# Patient Record
Sex: Female | Born: 1975 | Hispanic: Yes | Marital: Single | Smoking: Never smoker
Health system: Southern US, Community
[De-identification: ages and names within clinical notes are randomized; demographics above are authoritative.]

## PROBLEM LIST (undated history)

## (undated) ENCOUNTER — Emergency Department (HOSPITAL_COMMUNITY): Admission: EM | Payer: Self-pay

## (undated) DIAGNOSIS — Z8742 Personal history of other diseases of the female genital tract: Secondary | ICD-10-CM

## (undated) DIAGNOSIS — Z8639 Personal history of other endocrine, nutritional and metabolic disease: Secondary | ICD-10-CM

## (undated) DIAGNOSIS — K219 Gastro-esophageal reflux disease without esophagitis: Secondary | ICD-10-CM

## (undated) DIAGNOSIS — G44209 Tension-type headache, unspecified, not intractable: Secondary | ICD-10-CM

## (undated) HISTORY — DX: Gastro-esophageal reflux disease without esophagitis: K21.9

## (undated) HISTORY — DX: Tension-type headache, unspecified, not intractable: G44.209

## (undated) HISTORY — DX: Personal history of other diseases of the female genital tract: Z87.42

## (undated) HISTORY — DX: Personal history of other endocrine, nutritional and metabolic disease: Z86.39

---

## 2014-07-14 ENCOUNTER — Encounter: Payer: Self-pay | Admitting: Family Medicine

## 2014-07-14 ENCOUNTER — Ambulatory Visit: Payer: Self-pay | Attending: Family Medicine | Admitting: Family Medicine

## 2014-07-14 VITALS — BP 107/70 | HR 87 | Temp 98.6°F | Resp 18 | Ht 60.5 in | Wt 145.0 lb

## 2014-07-14 DIAGNOSIS — B9689 Other specified bacterial agents as the cause of diseases classified elsewhere: Secondary | ICD-10-CM

## 2014-07-14 DIAGNOSIS — K219 Gastro-esophageal reflux disease without esophagitis: Secondary | ICD-10-CM | POA: Insufficient documentation

## 2014-07-14 DIAGNOSIS — Z113 Encounter for screening for infections with a predominantly sexual mode of transmission: Secondary | ICD-10-CM

## 2014-07-14 DIAGNOSIS — Z23 Encounter for immunization: Secondary | ICD-10-CM

## 2014-07-14 DIAGNOSIS — Z114 Encounter for screening for human immunodeficiency virus [HIV]: Secondary | ICD-10-CM | POA: Insufficient documentation

## 2014-07-14 DIAGNOSIS — N946 Dysmenorrhea, unspecified: Secondary | ICD-10-CM | POA: Insufficient documentation

## 2014-07-14 DIAGNOSIS — Z124 Encounter for screening for malignant neoplasm of cervix: Secondary | ICD-10-CM | POA: Insufficient documentation

## 2014-07-14 DIAGNOSIS — G44209 Tension-type headache, unspecified, not intractable: Secondary | ICD-10-CM | POA: Insufficient documentation

## 2014-07-14 DIAGNOSIS — N760 Acute vaginitis: Secondary | ICD-10-CM | POA: Insufficient documentation

## 2014-07-14 DIAGNOSIS — A499 Bacterial infection, unspecified: Secondary | ICD-10-CM

## 2014-07-14 LAB — COMPLETE METABOLIC PANEL WITH GFR
ALT: 11 U/L (ref 0–35)
AST: 15 U/L (ref 0–37)
Albumin: 3.9 g/dL (ref 3.5–5.2)
Alkaline Phosphatase: 57 U/L (ref 39–117)
BUN: 8 mg/dL (ref 6–23)
CALCIUM: 8.9 mg/dL (ref 8.4–10.5)
CO2: 22 meq/L (ref 19–32)
CREATININE: 0.59 mg/dL (ref 0.50–1.10)
Chloride: 105 mEq/L (ref 96–112)
GFR, Est African American: >89 mL/min
GFR, Est Non African American: >89 mL/min
Glucose, Bld: 104 mg/dL — ABNORMAL HIGH (ref 70–99)
Potassium: 4.1 mEq/L (ref 3.5–5.3)
Sodium: 138 mEq/L (ref 135–145)
Total Bilirubin: 0.2 mg/dL (ref 0.2–1.2)
Total Protein: 7.1 g/dL (ref 6.0–8.3)

## 2014-07-14 LAB — CBC
HCT: 30.9 % — ABNORMAL LOW (ref 36.0–46.0)
Hemoglobin: 10.4 g/dL — ABNORMAL LOW (ref 12.0–15.0)
MCH: 26.3 pg (ref 26.0–34.0)
MCHC: 33.7 g/dL (ref 30.0–36.0)
MCV: 78 fL (ref 78.0–100.0)
MPV: 9.4 fL (ref 9.4–12.4)
PLATELETS: 391 10*3/uL (ref 150–400)
RBC: 3.96 MIL/uL (ref 3.87–5.11)
RDW: 15.9 % — AB (ref 11.5–15.5)
WBC: 8.8 10*3/uL (ref 4.0–10.5)

## 2014-07-14 MED ORDER — OMEPRAZOLE 20 MG PO CPDR: 20.0000 mg | DELAYED_RELEASE_CAPSULE | Freq: Every day | ORAL | Status: DC

## 2014-07-14 MED ORDER — NAPROXEN 500 MG PO TABS: 500.0000 mg | ORAL_TABLET | Freq: Two times a day (BID) | ORAL | Status: DC

## 2014-07-14 NOTE — Assessment & Plan Note (Signed)
A: dysmenorrhea x 6 months P: Naproxen 3 days before and first 2 days of period Pelvic ultrasound

## 2014-07-14 NOTE — Assessment & Plan Note (Signed)
A: tension HA P: Wean down caffeine NSAID prn

## 2014-07-14 NOTE — Assessment & Plan Note (Signed)
A: Gerd  P: PPI Wean down caffeine

## 2014-07-14 NOTE — Patient Instructions (Addendum)
Ms. Meriel PicaHernandez-Torres,  Thank you for coming in today. It was a pleasure meeting you. I look forward to being your primary doctor.   You will be called with lab results.  1. por dolor en su lado derecho.  Tome naproxen 500 mg dos tiempos cada dia  por tres dias antes su period, period dia uno y period [address] dos.  Pelvic ultrasound.   regresar in American Financialdos meses.   Dr. Armen PickupFunches

## 2014-07-14 NOTE — Assessment & Plan Note (Signed)
Screening HIV  

## 2014-07-14 NOTE — Assessment & Plan Note (Signed)
Pap done today  

## 2014-07-14 NOTE — Progress Notes (Signed)
Establish Care  Annual physical and pap Complaining of body ache and HA

## 2014-07-14 NOTE — Progress Notes (Signed)
   Subjective:    Patient ID: Oneal DeputyMaria Gloria Hernandez-Torres, female    DOB: 06/05/1976, 38 y.o.   MRN: [id number] CC: headache and right side  HPI 38 yo F  1. R side pain: x one week prior to period goes away, then comes back and stays there. Period started on 07/03/14. Period ended 07/10/14. Gets pain like this with every period for last 6 months. Pain comes and goes during the period, lasting 1-2 days. No vaginal discharge. Subjective fever at night with nausea. No emesis. No dizziness.   2. Headaches: gets headaches. Once per week. Frontal. Severe pain. Associated with photosensitivity. Better with rest. Better with ibuprofen. Drinks 3 cups of coffee daily. Triggers: no know.  No emesis or weakness.   Soc hx: chronic non smoker Fam hx: DM2 Surg Hx: C-section  Review of Systems     Objective:   Physical Exam BP 107/70 mmHg  Pulse 87  Temp(Src) 98.6 F (37 C) (Oral)  Resp 18  Ht 5' 0.5" (1.537 m)  Wt 145 lb (65.772 kg)  BMI 27.84 kg/m2  SpO2 100%  LMP 07/10/2014  General appearance: alert, cooperative and no distress Head: Normocephalic, without obvious abnormality, atraumatic Eyes: conjunctivae/corneas clear. PERRL, EOM's intact.  Ears: normal TM's and external ear canals both ears Nose: Nares normal. Septum midline. Mucosa normal. No drainage or sinus tenderness. Throat: lips, mucosa, and tongue normal; teeth and gums normal Abdomen: soft, non-tender; bowel sounds normal; no masses,  no organomegaly Pelvic: cervix normal in appearance, external genitalia normal, no adnexal masses or tenderness, no cervical motion tenderness, rectovaginal septum normal, uterus normal size, shape, and consistency and vagina normal without discharge   Assessment & Plan:

## 2014-07-15 DIAGNOSIS — B9689 Other specified bacterial agents as the cause of diseases classified elsewhere: Secondary | ICD-10-CM | POA: Insufficient documentation

## 2014-07-15 DIAGNOSIS — N760 Acute vaginitis: Secondary | ICD-10-CM

## 2014-07-15 LAB — HIV ANTIBODY (ROUTINE TESTING W REFLEX): HIV 1&2 Ab, 4th Generation: NONREACTIVE

## 2014-07-15 LAB — CERVICOVAGINAL ANCILLARY ONLY
Chlamydia: NEGATIVE
NEISSERIA GONORRHEA: NEGATIVE
WET PREP (BD AFFIRM): NEGATIVE
Wet Prep (BD Affirm): NEGATIVE
Wet Prep (BD Affirm): POSITIVE — AB

## 2014-07-15 LAB — CYTOLOGY - PAP

## 2014-07-15 MED ORDER — METRONIDAZOLE 500 MG PO TABS: 500.0000 mg | ORAL_TABLET | Freq: Two times a day (BID) | ORAL | Status: DC

## 2014-07-15 MED ORDER — FLUCONAZOLE 150 MG PO TABS: 150.0000 mg | ORAL_TABLET | Freq: Once | ORAL | Status: DC

## 2014-07-15 NOTE — Assessment & Plan Note (Signed)
A: BV on wet prep P: Flagyl Diflucan

## 2014-07-15 NOTE — Addendum Note (Signed)
Addended by: Dessa PhiFUNCHES, Shawndrea Rutkowski on: 07/15/2014 06:02 PM   Modules accepted: Orders

## 2014-07-20 ENCOUNTER — Ambulatory Visit (HOSPITAL_COMMUNITY)
Admission: RE | Admit: 2014-07-20 | Discharge: 2014-07-20 | Disposition: A | Discharge status: Home / Self Care | Payer: Self-pay | Source: Ambulatory Visit | Attending: Family Medicine | Admitting: Family Medicine

## 2014-07-20 ENCOUNTER — Other Ambulatory Visit: Payer: Self-pay | Admitting: Family Medicine

## 2014-07-20 DIAGNOSIS — N946 Dysmenorrhea, unspecified: Secondary | ICD-10-CM

## 2014-07-20 DIAGNOSIS — R102 Pelvic and perineal pain: Secondary | ICD-10-CM | POA: Insufficient documentation

## 2014-07-20 DIAGNOSIS — R938 Abnormal findings on diagnostic imaging of other specified body structures: Secondary | ICD-10-CM | POA: Insufficient documentation

## 2014-08-11 ENCOUNTER — Telehealth: Payer: Self-pay | Admitting: Family Medicine

## 2014-08-11 DIAGNOSIS — N946 Dysmenorrhea, unspecified: Secondary | ICD-10-CM

## 2014-08-11 NOTE — Addendum Note (Signed)
Addended by: Dessa PhiFUNCHES, Reyes Aldaco on: 08/11/2014 04:25 PM   Modules accepted: Orders

## 2014-08-11 NOTE — Telephone Encounter (Signed)
Pt.came into facility to request blood work results, please f/u with pt.

## 2014-08-11 NOTE — Telephone Encounter (Signed)
Ordered ultrasound

## 2014-08-11 NOTE — Assessment & Plan Note (Signed)
Plan for f/u ultrasound in 6-8 weeks, 1 week after periods. Patient will need to keep track of periods and let us know when she starts so we can schedule ultrasound one week after first day of menstrual period. Will obtain pelvic ultrasound on cycle day #8

## 2014-08-11 NOTE — Telephone Encounter (Signed)
Pt aware of lab results, US results, advised to call 1 week after menses to reschedule [place]

## 2014-09-22 ENCOUNTER — Encounter: Payer: Self-pay | Admitting: Family Medicine

## 2014-09-22 ENCOUNTER — Ambulatory Visit: Payer: Self-pay | Attending: Family Medicine | Admitting: Family Medicine

## 2014-09-22 VITALS — BP 126/84 | HR 80 | Temp 98.1°F | Ht 65.0 in | Wt 151.0 lb

## 2014-09-22 DIAGNOSIS — Z113 Encounter for screening for infections with a predominantly sexual mode of transmission: Secondary | ICD-10-CM

## 2014-09-22 DIAGNOSIS — N946 Dysmenorrhea, unspecified: Secondary | ICD-10-CM

## 2014-09-22 DIAGNOSIS — B9689 Other specified bacterial agents as the cause of diseases classified elsewhere: Secondary | ICD-10-CM

## 2014-09-22 DIAGNOSIS — N760 Acute vaginitis: Secondary | ICD-10-CM

## 2014-09-22 DIAGNOSIS — R938 Abnormal findings on diagnostic imaging of other specified body structures: Secondary | ICD-10-CM

## 2014-09-22 DIAGNOSIS — Z124 Encounter for screening for malignant neoplasm of cervix: Secondary | ICD-10-CM

## 2014-09-22 DIAGNOSIS — A499 Bacterial infection, unspecified: Secondary | ICD-10-CM

## 2014-09-22 DIAGNOSIS — R9389 Abnormal findings on diagnostic imaging of other specified body structures: Secondary | ICD-10-CM

## 2014-09-22 MED ORDER — NAPROXEN 500 MG PO TABS: 500.0000 mg | ORAL_TABLET | Freq: Two times a day (BID) | ORAL | Status: DC

## 2014-09-22 NOTE — Assessment & Plan Note (Signed)
Screening HIV negative  

## 2014-09-22 NOTE — Assessment & Plan Note (Signed)
Normal pap

## 2014-09-22 NOTE — Assessment & Plan Note (Signed)
A: endometrial thickness P: F/u pelvic and TVUS ordered

## 2014-09-22 NOTE — Patient Instructions (Addendum)
Ms. Meriel PicaHernandez-Torres,   Thank you for coming back in to see me today.   We need a follow up pelvic ultrasound one week after you start your next period.  We are looking at the thickness of your uterine lining (endometrium).  Your endometrium was a bit thick on the last ultrasound which may be normal thickening during the cycle.    Por dolor en su lado derecho.  Tome naproxen 500 mg dos tiempos cada dia  por dos dias antes su period, period dia uno y period [address] dos.  You will be called with ultrasound results.  F/u in 6 months   Dr. Armen PickupFunches

## 2014-09-22 NOTE — Assessment & Plan Note (Signed)
A: improved.  P: continue naproxen

## 2014-09-22 NOTE — Progress Notes (Signed)
   Subjective:    Patient ID: Cheryl Espinoza, female    DOB: 04/12/1976, [age] y.o.   MRN: [id number] CC: f/u dysmenorrhea,  HPI 39 yo F presents for f/u:   Spanish interpreter present  1. Dysmenorrhea: improved. Lighter and less painful periods with naproxen. Periods are still irregular every 5-8 weeks, lasting around 6 days. No intermenstrual bleeding. She is aware of previous labs and ultrasiund results.   2. Thickened endometrium: noted on last pelvic U/S. ROS as above. F/u ultrasound one week after LMP recommended.   Soc Hx: non smoker  Review of Systems As per HPI     Objective:   Physical Exam BP 126/84 mmHg  Pulse 80  Temp(Src) 98.1 F (36.7 C) (Oral)  Ht 5\' 5"  (1.651 m)  Wt 151 lb (68.493 kg)  BMI 25.13 kg/m2  SpO2 100%  LMP 09/01/2014 General appearance: alert, cooperative and no distress  Lungs: normal WOB        Assessment & Plan:

## 2014-09-22 NOTE — Progress Notes (Signed)
Pt is here for follow-up. Pt states she is still having headaches once a week. She states these have improved.  Pt also states that her period flow has become lighter since last visit. She has her period every month.  Pt denies any vaginal discharge at this time. First day of her last period was 09-01-14.

## 2014-09-22 NOTE — Assessment & Plan Note (Signed)
Treated

## 2014-10-06 ENCOUNTER — Other Ambulatory Visit: Payer: Self-pay | Admitting: Family Medicine

## 2014-10-06 ENCOUNTER — Telehealth: Payer: Self-pay | Admitting: *Deleted

## 2014-10-06 ENCOUNTER — Ambulatory Visit (HOSPITAL_COMMUNITY)
Admission: RE | Admit: 2014-10-06 | Discharge: 2014-10-06 | Disposition: A | Discharge status: Home / Self Care | Payer: Self-pay | Source: Ambulatory Visit | Attending: Family Medicine | Admitting: Family Medicine

## 2014-10-06 ENCOUNTER — Ambulatory Visit (HOSPITAL_COMMUNITY): Payer: Self-pay

## 2014-10-06 DIAGNOSIS — R9389 Abnormal findings on diagnostic imaging of other specified body structures: Secondary | ICD-10-CM

## 2014-10-06 DIAGNOSIS — N946 Dysmenorrhea, unspecified: Secondary | ICD-10-CM | POA: Insufficient documentation

## 2014-10-06 DIAGNOSIS — R938 Abnormal findings on diagnostic imaging of other specified body structures: Secondary | ICD-10-CM | POA: Insufficient documentation

## 2014-10-06 NOTE — Telephone Encounter (Signed)
LVM to return call.

## 2014-10-06 NOTE — Telephone Encounter (Signed)
-----   Message from Lora PaulaJosalyn C Funches, MD sent at 10/06/2014  9:41 AM EST ----- Persistent thickened endometrium, endometrial biopsy recommended. This procedure is set up like a pap, but instead of samples from the cervix tissue is sampled from the inside of the uterus.  Cramping and spotting is common after the procedure. This is an office procedure Gynecology referral placed.

## 2014-10-06 NOTE — Telephone Encounter (Signed)
-----   Message from Josalyn C Funches, MD sent at 10/06/2014  9:41 AM EST ----- Persistent thickened endometrium, endometrial biopsy recommended. This procedure is set up like a pap, but instead of samples from the cervix tissue is sampled from the inside of the uterus.  Cramping and spotting is common after the procedure. This is an office procedure Gynecology referral placed. 

## 2014-10-07 ENCOUNTER — Telehealth: Payer: Self-pay | Admitting: Family Medicine

## 2014-10-07 NOTE — Telephone Encounter (Signed)
Patient is returning phone call from nurse about results, please f/u with pt. °

## 2014-10-07 NOTE — Telephone Encounter (Signed)
Pt aware of results/ biopsy appointment schedule

## 2014-11-03 ENCOUNTER — Ambulatory Visit: Payer: Self-pay | Attending: Family Medicine

## 2014-11-07 ENCOUNTER — Other Ambulatory Visit (HOSPITAL_COMMUNITY)
Admission: RE | Admit: 2014-11-07 | Discharge: 2014-11-07 | Disposition: A | Discharge status: Home / Self Care | Payer: Self-pay | Source: Ambulatory Visit | Attending: Obstetrics & Gynecology | Admitting: Obstetrics & Gynecology

## 2014-11-07 ENCOUNTER — Encounter: Payer: Self-pay | Admitting: Obstetrics & Gynecology

## 2014-11-07 ENCOUNTER — Ambulatory Visit (INDEPENDENT_AMBULATORY_CARE_PROVIDER_SITE_OTHER): Payer: Self-pay | Admitting: Obstetrics & Gynecology

## 2014-11-07 VITALS — BP 108/68 | HR 75 | Wt 146.7 lb

## 2014-11-07 DIAGNOSIS — N946 Dysmenorrhea, unspecified: Secondary | ICD-10-CM

## 2014-11-07 DIAGNOSIS — N920 Excessive and frequent menstruation with regular cycle: Secondary | ICD-10-CM

## 2014-11-07 LAB — POCT PREGNANCY, URINE: Preg Test, Ur: NEGATIVE

## 2014-11-07 MED ORDER — NORGESTIMATE-ETH ESTRADIOL 0.25-35 MG-MCG PO TABS: 1.0000 | ORAL_TABLET | Freq: Every day | ORAL | Status: DC

## 2014-11-07 NOTE — Progress Notes (Signed)
Subjective:     Patient ID: Cheryl Espinoza, female   DOB: 11/15/1975, 39 y.o.   MRN: [id number]  HPI Pt reports heavy cycles lasting 7 days.  She reports that the heavy cycles since May of last year.  She reports cycles monthly.  She was prev on medicine to assist with heavy cycles but, she does not take it now.  No contraception. Not sexually active.     History reviewed. No pertinent past medical history.  Past Surgical History  Procedure Laterality Date  . Cesarean section  2000, 2002    Current Outpatient Prescriptions on File Prior to Visit  Medication Sig Dispense Refill  . naproxen (NAPROSYN) 500 MG tablet Take 1 tablet (500 mg total) by mouth 2 (two) times daily with a meal. (Patient not taking: Reported on 11/07/2014) 30 tablet 1  . omeprazole (PRILOSEC) 20 MG capsule Take 1 capsule (20 mg total) by mouth daily. (Patient not taking: Reported on 11/07/2014) 30 capsule 3   No current facility-administered medications on file prior to visit.  No Known Allergies    Review of Systems     Objective:   Physical Exam BP 108/68 mmHg  Pulse 75  Wt 146 lb 11.2 oz (66.543 kg)  LMP 11/03/2014 (Exact Date) Pt in NAD  The indications for endometrial biopsy were reviewed.   Risks of the biopsy including cramping, bleeding, infection, uterine perforation, inadequate specimen and need for additional procedures  were discussed. The patient states she understands and agrees to undergo procedure today. Consent was signed. Time out was performed. Urine HCG was negative. A sterile speculum was placed in the patient's vagina and the cervix was prepped with Betadine. A single-toothed tenaculum was placed on the anterior lip of the cervix to stabilize it. The 3 mm pipelle was introduced into the endometrial cavity without difficulty to a depth of 10cm, and a moderate amount of tissue was obtained and sent to pathology. The instruments were removed from the patient's vagina. Minimal bleeding  from the cervix was noted. The patient tolerated the procedure well. Routine post-procedure instructions were given to the patient.        10/06/2014 CLINICAL DATA: Dysmenorrhea.  EXAM: TRANSABDOMINAL AND TRANSVAGINAL ULTRASOUND OF PELVIS  TECHNIQUE: Both transabdominal and transvaginal ultrasound examinations of the pelvis were performed. Transabdominal technique was performed for global imaging of the pelvis including uterus, ovaries, adnexal regions, and pelvic cul-de-sac. It was necessary to proceed with endovaginal exam following the transabdominal exam to visualize the uterus and ovaries.  COMPARISON: 07/20/2014.  FINDINGS: Uterus  Measurements: 9.2 x 4.0 x 5.5 cm. No fibroids or other mass visualized.  Endometrium  Thickness: 16-421mm. Endometrial borders are very difficult to identify and thus measure. Endometrial thickening is not improved.  Right ovary  Measurements: 2.8 x 2.2 x 2.2 cm. Normal appearance/no adnexal mass.  Left ovary  Measurements: 2.9 x 2.5 x 1.4 cm. Normal appearance/no adnexal mass.  Other findings  No free fluid.  IMPRESSION: Persistent endometrial thickening. Endometrial sampling should be considered.  Assessment:     menorhagia- thickedned endometrium on sono x 2. Pt worried.      Plan:    Sprintec 1 po q day F/u in 3 months F/u endobx  The patient will follow up to review the results and for further management.

## 2014-11-07 NOTE — Patient Instructions (Signed)
Levonorgestrel intrauterine device (IUD) Qu es este medicamento? El LEVONORGESTREL (DIU) es un dispositivo anticonceptivo (control de natalidad). El dispositivo se coloca dentro del tero por un profesional de la salud. Se utiliza para evitar el embarazo y tambin se puede utilizar para tratar el sangrado abundante que ocurre durante su perodo. Dependiendo del dispositivo, se puede utilizar por 3 a 5 aos. Este medicamento puede ser utilizado para otros usos; si tiene alguna pregunta consulte con su proveedor de atencin mdica o con su farmacutico. MARCAS COMERCIALES DISPONIBLES: LILETTA, Mirena, Skyla Qu le debo informar a mi profesional de la salud antes de tomar este medicamento? Necesita saber si usted presenta alguno de los siguientes problemas o situaciones: -exmen de Papanicolaou anormal -cncer de mama, cuello del tero o tero -diabetes -endometritis -si tiene una infeccin plvica o genital actual o en el pasado -tiene ms de una pareja sexual o si su pareja tiene ms de una pareja -enfermedad cardiaca -antecedente de embarazo tubrico o ectpico -problemas del sistema inmunolgico -DIU colocado -enfermedad heptica o tumor del hgado -problemas con la coagulacin o si toma diluyentes sanguneos -usa medicamentos intravenoso -forma inusual del tero -sangrado vaginal que no tiene explicacin -una reaccin alrgica o inusual al levonorgestrel, a otras hormonas, a la silicona o polietilenos, a otros medicamentos, alimentos, colorantes o conservantes -si est embarazada o buscando quedar embarazada -si est amamantando a un beb Cmo debo utilizar este medicamento? Un profesional de la salud coloca este dispositivo en el tero. Hable con su pediatra para informarse acerca del uso de este medicamento en nios. Puede requerir atencin especial. Sobredosis: Pngase en contacto inmediatamente con un centro toxicolgico o una sala de urgencia si usted cree que haya tomado  demasiado medicamento. ATENCIN: Este medicamento es solo para usted. No comparta este medicamento con nadie. Qu sucede si me olvido de una dosis? No se aplica en este caso. Qu puede interactuar con este medicamento? No tome esta medicina con ninguno de los siguientes medicamentos: -amprenavir -bosentano -fosamprenavir Esta medicina tambin puede interactuar con los siguientes medicamentos: -aprepitant -barbitricos para producir el sueo o para el tratamiento de convulsiones -bexaroteno -griseofulvina -medicamentos para tratar los convulsiones, tales como carbamazepina, etotona, felbamato, oxcarbazepina, fenitona, topiramato -modafinilo -pioglitazona -rifabutina -rifampicina -rifapentina -algunos medicamentos para tratar el virus VIH, tales como atazanavir, indinavir, lopinavir, nelfinavir, tipranavir, ritonavir -hierba de [place] -warfarina Puede ser que esta lista no menciona todas las posibles interacciones. Informe a su profesional de la salud de todos los productos a base de hierbas, medicamentos de venta libre o suplementos nutritivos que est tomando. Si usted fuma, consume bebidas alcohlicas o si utiliza drogas ilegales, indqueselo tambin a su profesional de la salud. Algunas sustancias pueden interactuar con su medicamento. A qu debo estar atento al usar este medicamento? Visite a su mdico o a su profesional de la salud para chequear su evolucin peridicamente. Visite a su mdico si usted o su pareja tiene relaciones sexuales con otras personas, se vuelve VIH positivo o contrae una enfermedad de transmisin sexual. Este medicamento no la protege de la infeccin por VIH (SIDA) ni de ninguna otra enfermedad de transmisin sexual. Puede controlar la ubicacin del DIU usted misma palpando con sus dedos limpios los hilos en la parte anterior de la vagina. No tire de los hilos. Es un buen hbito controlar la ubicacin del dispositivo despus de cada perodo menstrual. Si  no slo siente los hilos sino que adems siente otra parte ms del DIU o si no puede sentir los hilos, consulte a su   mdico inmediatamente. El DIU puede salirse por s solo. Puede quedar embarazada si el dispositivo se sale de Nature conservation officersu lugar. Utilice un mtodo anticonceptivo adicional, como preservativos, y consulte a su proveedor de atencin mdica s observa que el DIU se sali de Nature conservation officersu lugar. La utilizacin de tampones no cambia la posicin del DIU y no hay inconvenientes en usarlos durante su perodo. Qu efectos secundarios puedo tener al Boston Scientificutilizar este medicamento? Efectos secundarios que debe informar a su mdico o a Producer, television/film/videosu profesional de la salud tan pronto como sea posible: -Therapist, artreacciones alrgicas como erupcin cutnea, picazn o urticarias, hinchazn de la cara, labios o lengua -fiebre, sntomas gripales -llagas genitales -alta presin sangunea -ausencia de un perodo menstrual durante 6 semanas mientras lo utiliza -Engineer, miningdolor, Public librarianhinchazn o calor en las piernas -dolor o sensibilidad del plvico -dolor de cabeza repentino o severo -signos de Psychiatristembarazo -calambres estomacales -falta de aliento repentina -problemas de coordinacin, del habla, al caminar -sangrado, flujo vaginal inusual -color amarillento de los ojos o la piel Efectos secundarios que, por lo general, no requieren atencin mdica (debe informarlos a su mdico o a su profesional de la salud si persisten o si son molestos): -acn -dolor de pecho -cambios en el deseo sexual o capacidad -cambios de peso -calambres, Research scientist (life sciences)mareos o sensacin de The Pepsidesmayo mientras se introduce el dispositivo -dolor de cabeza -sangrado menstruales irregulares en los primeros 3 a 6 meses de usar -nuseas Puede ser que esta lista no menciona todos los posibles efectos secundarios. Comunquese a su mdico por asesoramiento mdico Hewlett-Packardsobre los efectos secundarios. Usted puede informar los efectos secundarios a la FDA por telfono al 1-800-FDA-1088. Dnde debo guardar mi  medicina? No se aplica en este caso. ATENCIN: Este folleto es un resumen. Puede ser que no cubra toda la posible informacin. Si usted tiene preguntas acerca de esta medicina, consulte con su mdico, su farmacutico o su profesional de Radiographer, therapeuticla salud.  2015, Elsevier/Gold Standard. (2011-10-01 16:57:41) Uso de los anticonceptivos orales (Oral Contraception Use) Los anticonceptivos orales (ACO) son medicamentos que se utilizan para Location managerevitar el embarazo. Su funcin es ALLTEL Corporationevitar que los ovarios liberen vulos. Las hormonas de los ACO tambin hacen que el moco cervical se haga ms espeso, lo que evita que el esperma ingrese al tero. Tambin hacen que la membrana que recubre internamente al tero se vuelva ms fina, lo que no permite que el huevo fertilizado se adhiera a la pared del tero. Los ACO son muy efectivos cuando se toman exactamente como se prescriben. Sin embargo, no previenen contra las enfermedades de transmisin sexual (ETS). La prctica del sexo seguro, como el uso de preservativos, junto con los ACO, [place]udan a prevenir ese tipo de enfermedades. Antes de tomar ACO, debe hacerse un examen fsico y un Papanicolau. El mdico podr indicarle anlisis de [place]angre, si es necesario. El mdico se asegurar de que usted sea [place] Pointuna buena candidata para usar anticonceptivos orales. Converse con su mdico acerca de los posibles efectos secundarios de los ACO que podran recetarle. Cuando se inicia el uso de ACO, se pueden tomar durante 2 a 3 meses para que el cuerpo se adapte a los cambios en los niveles hormonales en el cuerpo.  CMO TOMAR LOS ANTICONCEPTIVOS ORALES El mdico le indicar como comenzar a Building services engineertomar el primer ciclo de ACO. De lo contrario usted puede:   Engineering geologistComenzar el da de inicio del ciclo menstrual. No necesitar proteccin anticonceptiva adicional al Investment banker, operationalcomenzar en este momento.   Comenzar Financial risk analystel primer domingo luego de su perodo menstrual, o Medical laboratory scientific officerel da  en que adquiere el medicamento. En estos casos deber EchoStartener  proteccin anticonceptiva The TJX Companiesadicional durante los primeros 7 das del Luzerneciclo.   Comenzar a tomarlos en cualquier momento del ciclo. Si toma el anticonceptivo dentro de los [address] das de iniciado el perodo, Theme park managerestar protegida de quedar embarazada inmediatamente. En este caso, no necesitar una forma adicional de anticonceptivos. Si comienza en cualquier otro momento del ciclo menstrual, necesitar usar otra forma de anticonceptivo durante 7 [address]  [address]. Si sus ACO son del tipo de los Citigroupllamados minipldoras, podrn impedir el embarazo despus de tomarlas por 2 das (48 horas). Luego de comenzar a tomar los ACO:   Si olvid de tomar 1 pldora, tmela tan pronto como lo recuerde. Tome la siguiente pldora a la hora habitual.   Si dej de tomar 2 o ms pldoras, comunquese con su mdico ya que diferentes pldoras tienen diferentes instrucciones para las dosis que no se han tomado. Si olvida tomar 2 o ms pldoras, utilice un mtodo anticonceptivo adicional hasta que comience su prximo perodo menstrual.   Si utiliza el envase de 28 pldoras que contienen pldoras inactivas y Venezuelaolvida tomar 1 de las ltimas 7 (pldoras sin hormonas), sto no tiene Quarry managerimportancia. Simplemente deseche el resto de las pldoras que no contienen hormonas y comience un nuevo envase.  No importa cuando comience a tomar los anticonceptivos, siempre empiece un nuevo envase el mismo da de la [place] Creeksemana. Tenga un envase extra de ACO y use un mtodo anticonceptivo adicional para Restaurant manager, fast foodel caso en que se olvide de tomar algunas pldoras o pierda la caja.  INSTRUCCIONES PARA EL CUIDADO EN EL HOGAR   No fume.   Use siempre un condn para protegerse contra las enfermedades de transmisin sexual. Los ACO no protegen contra las enfermedades de transmisin sexual.   Use un almanaque para Thrivent Financialmarcar los das de su perodo menstrual.   Lea la informacin y consejos que vienen con las ACO. Hable con el profesional si tiene dudas.  SOLICITE ATENCIN MDICA SI:    Presenta nuseas o vmitos.   Tiene flujo o sangrado vaginal anormal.   Aparece una erupcin cutnea.   No tiene el perodo menstrual.   Pierde el cabello.   Necesita tratamiento por cambios en su estado de nimo o por depresin.   Se siente mareada al Liberty Mutualtomar los ACO.   Comienza a aparecer acn con el uso de los ACO.   Ardelle AntonQueda embarazada.  SOLICITE ATENCIN MDICA DE INMEDIATO SI:   Siente dolor en el pecho.   Le falta el aire.   Le duele mucho la cabeza y no puede Human resources officercontrolar el dolor.   Siente adormecimiento o tiene dificultad para hablar.   Tiene problemas de visin.   Presenta dolor, inflamacin o hinchazn en las piernas.  Document Released: 08/01/2011 Document Revised: 04/14/2013 [facility]Exit[facility] Patient Information 2015 [employer] DayExitCare, MarylandLLC. This information is not intended to replace advice given to you by your health care provider. Make sure you discuss any questions you have with your health care provider.

## 2014-11-14 ENCOUNTER — Telehealth: Payer: Self-pay | Admitting: *Deleted

## 2014-11-14 NOTE — Telephone Encounter (Signed)
-----   Message from Willodean Rosenthalarolyn Harraway-Smith, MD sent at 11/14/2014  9:58 AM EDT ----- Please call pt.  Her endometrial bx was negative.  Please continue the Sprintec as prescribed.  Thx, clh-S

## 2014-11-14 NOTE — Telephone Encounter (Signed)
Attempted to contact patient with Cheryl Espinoza for results, no answer, left a message for the patient to contact the clinic for results.

## 2014-11-15 NOTE — Telephone Encounter (Signed)
Called patient with Cheryl Espinoza for interpreter and informed her of results and recommendations. Patient verbalized understanding and had no questions

## 2015-01-27 ENCOUNTER — Ambulatory Visit: Payer: Self-pay | Admitting: Family Medicine

## 2015-02-09 ENCOUNTER — Ambulatory Visit: Payer: Self-pay | Admitting: Family Medicine

## 2015-03-15 ENCOUNTER — Ambulatory Visit: Payer: Self-pay | Attending: Family Medicine | Admitting: Family Medicine

## 2015-03-15 ENCOUNTER — Encounter: Payer: Self-pay | Admitting: Family Medicine

## 2015-03-15 VITALS — BP 117/79 | HR 87 | Temp 98.5°F | Resp 16 | Ht 60.5 in | Wt 144.0 lb

## 2015-03-15 DIAGNOSIS — B353 Tinea pedis: Secondary | ICD-10-CM

## 2015-03-15 DIAGNOSIS — K219 Gastro-esophageal reflux disease without esophagitis: Secondary | ICD-10-CM

## 2015-03-15 DIAGNOSIS — G44209 Tension-type headache, unspecified, not intractable: Secondary | ICD-10-CM

## 2015-03-15 DIAGNOSIS — J309 Allergic rhinitis, unspecified: Secondary | ICD-10-CM

## 2015-03-15 DIAGNOSIS — E559 Vitamin D deficiency, unspecified: Secondary | ICD-10-CM

## 2015-03-15 MED ORDER — TERBINAFINE HCL 1 % EX CREA: 1.0000 "application " | TOPICAL_CREAM | Freq: Two times a day (BID) | CUTANEOUS | Status: DC

## 2015-03-15 MED ORDER — CETIRIZINE HCL 10 MG PO TABS: 10.0000 mg | ORAL_TABLET | Freq: Every day | ORAL | Status: DC

## 2015-03-15 MED ORDER — PROPRANOLOL HCL 40 MG PO TABS: 40.0000 mg | ORAL_TABLET | Freq: Two times a day (BID) | ORAL | Status: DC

## 2015-03-15 NOTE — Patient Instructions (Signed)
Cheryl Espinoza,  Thank you for coming in today  1. Headache: Tension type, chronic  Start propranolol 40 mg twice daily for headache prevention Also start zyrtec 10 mg one daily   2. Chest pain: Normal breast exam  Symptoms consistent with GERD Plan for prilosec before supper about 30 minutes   3. Foot fungus: Use lamisil cream twice daily until healed  F/u in 6 weeks for chronic headaches  Dr. Armen PickupFunches   Opciones de alimentos para pacientes con reflujo gastroesofgico (Food Choices for Gastroesophageal Reflux Disease) Cuando se tiene reflujo gastroesofgico (ERGE), los alimentos que se ingieren y los hbitos de alimentacin son Engineer, productionmuy importantes. Elegir los alimentos adecuados puede ayudar a Altria Groupaliviar las molestias.  QU PAUTAS DEBO SEGUIR?   Elija las frutas, los vegetales, los cereales integrales y los productos lcteos con bajo contenido de [place]-Sandersgrasa.  Elija las carnes de [place]vaca, de pescado y de ave con bajo contenido de grasas.  Limite las grasas, [address]como los Shippenvilleaceites, los aderezos para [place]alada, la Farm Loopmanteca, los frutos secos y Programme researcher, broadcasting/film/videoel aguacate.  Lleve un registro de alimentos. Esto ayuda a identificar los alimentos que ocasionan sntomas.  Evite los alimentos que le ocasionen sntomas. Pueden ser distintos para cada persona.  Haga comidas pequeas durante Glass blower/designerel da en lugar de 3 comidas abundantes.  Coma lentamente, en un lugar donde est distendido.  Limite el consumo de alimentos fritos.  Cocine los alimentos utilizando mtodos que no sean la fritura.  Evite el consumo alcohol.  Evite beber grandes cantidades de lquidos con las comidas.  Evite agacharse o recostarse hasta despus de 2 o 3horas de haber comido. QU ALIMENTOS NO SE RECOMIENDAN?  Estos son algunos alimentos y bebidas que pueden empeorar los sntomas: Veterinary surgeonVegetales Tomates. Jugo de tomate. Salsa de tomate y espagueti. Ajes. Cebolla y [place]. Rbano picante. Frutas Naranjas, pomelos y limn (fruta y  Sloveniajugo). Carnes Carnes de [place], de pescado y de ave con gran contenido de grasas. Esto incluye los perros calientes, las Fredericktowncostillas, el Claypooljamn, la salchicha, el salame y el tocino. Lcteos Leche entera y Bardmoorleche chocolatada. PPG IndustriesCrema cida. Crema. Mantequilla. Helados. Queso crema.  Bebidas T o caf. Bebidas gaseosas o bebidas energizantes. Condimentos Salsa picante. Salsa barbacoa.  Dulces/postres Chocolate y cacao. Rosquillas. Menta y mentol. Grasas y Du Pontaceites Alimentos muy grasos. Esto incluye las papas fritas. Otros Vinagre. Especias picantes. Esto incluye la pimienta negra, la pimienta blanca, la pimienta roja, la pimienta de cayena, el curry en polvo, los clavos de [place]olor, el jengibre y el [place] en polvo. Esta no es Raytheonuna lista completa de los alimentos y las bebidas que se Theatre stage managerdeben evitar. Comunquese con el nutricionista para recibir ms informacin. Document Released: 02/11/2012 Document Revised: 08/17/2013 [facility] LLCExitCare Patient Information 2015 [place]ExitCare, [place]. This information is not intended to replace advice given to you by your health care provider. Make sure you discuss any questions you have with your health care provider.

## 2015-03-15 NOTE — Assessment & Plan Note (Signed)
Chest pain: Normal breast exam  Symptoms consistent with GERD Plan for prilosec before supper about 30 minutes

## 2015-03-15 NOTE — Progress Notes (Signed)
Complaining headache, pain around jaw area and forehead Chest pressure pain breast  Stated has callus

## 2015-03-15 NOTE — Progress Notes (Signed)
   Subjective:    Patient ID: Oneal DeputyMaria Gloria Hernandez-Torres, female    DOB: 05/28/1976, 39 y.o.   MRN: [id number] CC: f/u headache, chest pressure and breast pain  Spanish interpreter present   HPI  1. Headache: intermittent. Comes and last for 3 days. Pain is described as throbbing. Occurs in different locations. Scalp sensitivity. Also with jaw pain. Feels nervous when she has the pain. Sensitivity to light and sound when she had the pain.  No vision changes, nausea or emesis. Advil helps a little bit. No known triggers. Headache symptoms and severity are stable. Gets bad headaches about 5 times a month. Right now her pain is a little better. Slight achy head and eyes burn. Her parents and siblings also with chronic headache.   2. Chest pain: often, 4-5 times per month. Started one month ago. Pressure sensation.  L breast and moves to L parasternal area. Feels SOB. Feels pain as she is drinking water. No cough. Tylenol helps a bit. Sometimes pain is around noon or a night. 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 No food triggers. Does admit to bitter taste in mouth in AM and sore throat.   3. Calluses on feet: scaly. Sometimes itch. Does not hurt. R great toenail does have pain.   Medications: none  Soc Hx: non smoker   Review of Systems  Constitutional: Negative for fever and chills.  Respiratory: Positive for shortness of breath.   Cardiovascular: Positive for chest pain. Negative for palpitations and leg swelling.  Gastrointestinal: Negative for abdominal pain and blood in stool.  Skin: Positive for rash.  Neurological: Positive for headaches.  Psychiatric/Behavioral: Negative for suicidal ideas and dysphoric mood.       Objective:   Physical Exam BP 117/79 mmHg  Pulse 87  Temp(Src) 98.5 F (36.9 C) (Oral)  Resp 16  Ht 5' 0.5" (1.537 m)  Wt 144 lb (65.318 kg)  BMI 27.65 kg/m2  SpO2 100%  LMP 03/07/2015 General appearance: alert, cooperative and no distress Head:  Normocephalic, without obvious abnormality, atraumatic Eyes: conjunctivae/corneas clear. PERRL, EOM's intact. Fundi benign. Ears: normal TM's and external ear canals both ears Nose: no discharge, right turbinate red, swollen, left turbinate normal Throat: lips, mucosa, and tongue normal; teeth and gums normal Lungs: clear to auscultation bilaterally Breasts: normal appearance, no masses or tenderness, Inspection negative, No nipple retraction or dimpling, No nipple discharge or bleeding, No axillary or supraclavicular adenopathy Heart: regular rate and rhythm, S1, S2 normal, no murmur, click, rub or gallop  Ext: no edema Skin: Dry and scaly on soles of feet R great toenail yellowed and thickened.       Assessment & Plan:

## 2015-03-15 NOTE — Assessment & Plan Note (Signed)
A: Tesnsion Headache: Tension type, chronic  P: Start propranolol 40 mg twice daily for headache prevention Also start zyrtec 10 mg one daily

## 2015-03-15 NOTE — Assessment & Plan Note (Signed)
Foot fungus: Use lamisil cream twice daily until healed

## 2015-03-16 DIAGNOSIS — E559 Vitamin D deficiency, unspecified: Secondary | ICD-10-CM | POA: Insufficient documentation

## 2015-03-16 LAB — VITAMIN D 25 HYDROXY (VIT D DEFICIENCY, FRACTURES): VIT D 25 HYDROXY: 19 ng/mL — AB (ref 30–100)

## 2015-03-16 MED ORDER — VITAMIN D (ERGOCALCIFEROL) 1.25 MG (50000 UNIT) PO CAPS: 50000.0000 [IU] | ORAL_CAPSULE | ORAL | Status: DC

## 2015-03-16 NOTE — Addendum Note (Signed)
Addended by: Dessa Phi on: 03/16/2015 09:23 AM   Modules accepted: Orders

## 2015-03-16 NOTE — Assessment & Plan Note (Signed)
A: vit D deficiency P: replaced vit D orally per orders  

## 2015-03-17 ENCOUNTER — Telehealth: Payer: Self-pay | Admitting: *Deleted

## 2015-03-17 NOTE — Telephone Encounter (Signed)
-----   Message from Dessa Phi, MD sent at 03/16/2015  9:22 AM EDT ----- Vit D deficiency Take vit D weekly x 8 weeks

## 2015-03-17 NOTE — Telephone Encounter (Signed)
Pt aware or results  

## 2015-04-11 ENCOUNTER — Ambulatory Visit: Payer: Self-pay | Attending: Family Medicine

## 2015-05-25 ENCOUNTER — Ambulatory Visit: Payer: Self-pay | Attending: Family Medicine

## 2015-06-01 ENCOUNTER — Encounter: Payer: Self-pay | Admitting: Family Medicine

## 2015-06-01 ENCOUNTER — Ambulatory Visit: Payer: Self-pay | Attending: Family Medicine | Admitting: Family Medicine

## 2015-06-01 VITALS — BP 107/71 | HR 79 | Temp 98.3°F | Resp 16 | Ht 60.5 in | Wt 143.0 lb

## 2015-06-01 DIAGNOSIS — B351 Tinea unguium: Secondary | ICD-10-CM | POA: Insufficient documentation

## 2015-06-01 DIAGNOSIS — J309 Allergic rhinitis, unspecified: Secondary | ICD-10-CM | POA: Insufficient documentation

## 2015-06-01 DIAGNOSIS — G44229 Chronic tension-type headache, not intractable: Secondary | ICD-10-CM | POA: Insufficient documentation

## 2015-06-01 DIAGNOSIS — B353 Tinea pedis: Secondary | ICD-10-CM | POA: Insufficient documentation

## 2015-06-01 DIAGNOSIS — G44209 Tension-type headache, unspecified, not intractable: Secondary | ICD-10-CM

## 2015-06-01 DIAGNOSIS — Z0000 Encounter for general adult medical examination without abnormal findings: Secondary | ICD-10-CM | POA: Insufficient documentation

## 2015-06-01 LAB — COMPLETE METABOLIC PANEL WITH GFR
ALBUMIN: 4.4 g/dL (ref 3.6–5.1)
ALK PHOS: 54 U/L (ref 33–115)
ALT: 10 U/L (ref 6–29)
AST: 15 U/L (ref 10–30)
BILIRUBIN TOTAL: 0.5 mg/dL (ref 0.2–1.2)
BUN: 9 mg/dL (ref 7–25)
CO2: 26 mmol/L (ref 20–31)
CREATININE: 0.48 mg/dL — AB (ref 0.50–1.10)
Calcium: 9.2 mg/dL (ref 8.6–10.2)
Chloride: 106 mmol/L (ref 98–110)
GFR, Est African American: >89 mL/min (ref >=60)
GFR, Est Non African American: >89 mL/min (ref >=60)
GLUCOSE: 82 mg/dL (ref 65–99)
Potassium: 3.9 mmol/L (ref 3.5–5.3)
Sodium: 139 mmol/L (ref 135–146)
TOTAL PROTEIN: 7.7 g/dL (ref 6.1–8.1)

## 2015-06-01 MED ORDER — PROPRANOLOL HCL 40 MG PO TABS: 40.0000 mg | ORAL_TABLET | Freq: Two times a day (BID) | ORAL | Status: DC

## 2015-06-01 MED ORDER — TERBINAFINE HCL 250 MG PO TABS: 250.0000 mg | ORAL_TABLET | Freq: Every day | ORAL | Status: DC

## 2015-06-01 MED ORDER — CETIRIZINE HCL 10 MG PO TABS: 10.0000 mg | ORAL_TABLET | Freq: Every day | ORAL | Status: DC

## 2015-06-01 MED ORDER — TERBINAFINE HCL 1 % EX CREA: 1.0000 "application " | TOPICAL_CREAM | Freq: Two times a day (BID) | CUTANEOUS | Status: DC

## 2015-06-01 NOTE — Progress Notes (Signed)
F/U HA  No pain today  HA on and off. No hx tobacco  Refills lamasil lotion

## 2015-06-01 NOTE — Progress Notes (Signed)
Patient ID: Cheryl Espinoza, female   DOB: 10-27-1975, 39 y.o.   MRN: [id number]   Subjective:  Patient ID: Cheryl Espinoza, female    DOB: [birth year]  Age: 39 y.o. MRN: [id number]  CC: Follow-up and Headache   HPI Cheryl Espinoza presents for   1. Headache:  Patient with chronic tension type headaches since age 39 yrs. Since last OV she reports 1 headache per month. HA last for 1 day. She is out of metoprolol currently. While on metoprolol she denies dizziness, lightheadedness and fatigue. No HA currently.    Outpatient Prescriptions Prior to Visit  Medication Sig Dispense Refill  . cetirizine (ZYRTEC) 10 MG tablet Take 1 tablet (10 mg total) by mouth daily. (Patient not taking: Reported on 06/01/2015) 30 tablet 11  . omeprazole (PRILOSEC) 20 MG capsule Take 1 capsule (20 mg total) by mouth daily. (Patient not taking: Reported on 11/07/2014) 30 capsule 3  . propranolol (INDERAL) 40 MG tablet Take 1 tablet (40 mg total) by mouth 2 (two) times daily. (Patient not taking: Reported on 06/01/2015) 60 tablet 1  . terbinafine (LAMISIL AT) 1 % cream Apply 1 application topically 2 (two) times daily. Apply to feet (Patient not taking: Reported on 06/01/2015) 30 g 2  . Vitamin D, Ergocalciferol, (DRISDOL) 50000 UNITS CAPS capsule Take 1 capsule (50,000 Units total) by mouth every 7 (seven) days. For 8 weeks (Patient not taking: Reported on 06/01/2015) 8 capsule 0   No facility-administered medications prior to visit.    ROS Review of Systems  Constitutional: Negative for fever and chills.  HENT: Negative for ear pain, hearing loss and tinnitus.   Eyes: Negative for visual disturbance.  Respiratory: Negative for shortness of breath.   Cardiovascular: Negative for chest pain.  Gastrointestinal: Negative for abdominal pain and blood in stool.  Musculoskeletal: Negative for back pain and arthralgias.  Skin: Negative for rash.  Allergic/Immunologic: Negative for  immunocompromised state.  Neurological: Negative for dizziness, light-headedness and headaches.  Hematological: Negative for adenopathy. Does not bruise/bleed easily.  Psychiatric/Behavioral: Negative for suicidal ideas and dysphoric mood.    Objective:  BP 107/71 mmHg  Pulse 79  Temp(Src) 98.3 F (36.8 C) (Oral)  Resp 16  Ht 5' 0.5" (1.537 m)  Wt 143 lb (64.864 kg)  BMI 27.46 kg/m2  SpO2 100%  LMP 05/10/2015  BP/Weight 06/01/2015 03/15/2015 11/07/2014  Systolic BP 107 117 108  Diastolic BP 71 79 68  Wt. (Lbs) 143 144 146.7  BMI 27.46 27.65 24.41   Physical Exam  Constitutional: She is oriented to person, place, and time. She appears well-developed and well-nourished. No distress.  HENT:  Head: Normocephalic and atraumatic.  Right Ear: Tympanic membrane, external ear and ear canal normal.  Left Ear: Tympanic membrane, external ear and ear canal normal.  Nose: Mucosal edema present.  Mouth/Throat: Oropharynx is clear and moist.  Eyes: Conjunctivae and EOM are normal. Pupils are equal, round, and reactive to light.  Cardiovascular: Normal rate, regular rhythm, normal heart sounds and intact distal pulses.   Pulmonary/Chest: Effort normal and breath sounds normal.  Musculoskeletal: She exhibits no edema.  Neurological: She is alert and oriented to person, place, and time.  Skin: Skin is warm and dry. No rash noted.     Psychiatric: She has a normal mood and affect.     Assessment & Plan:   Problem List Items Addressed This Visit    Allergic rhinitis (Chronic)   Relevant Medications   cetirizine (ZYRTEC) 10 MG  tablet   Tension headache (Chronic)   Relevant Medications   propranolol (INDERAL) 40 MG tablet   Tinea pedis   Relevant Medications   terbinafine (LAMISIL AT) 1 % cream   terbinafine (LAMISIL) 250 MG tablet    Other Visit Diagnoses    Healthcare maintenance    -  Primary    Relevant Orders    Flu Vaccine QUAD 36+ mos IM (Completed)    Onychomycosis of  right great toe        Relevant Medications    terbinafine (LAMISIL AT) 1 % cream    terbinafine (LAMISIL) 250 MG tablet    Other Relevant Orders    COMPLETE METABOLIC PANEL WITH GFR       No orders of the defined types were placed in this encounter.    Follow-up: No Follow-up on file.   Dessa Phi MD

## 2015-06-01 NOTE — Patient Instructions (Addendum)
Cheryl Espinoza was seen today for follow-up and headache.  Diagnoses and all orders for this visit:  Healthcare maintenance -     Flu Vaccine QUAD 36+ mos IM  Tinea pedis of both feet -     terbinafine (LAMISIL AT) 1 % cream; Apply 1 application topically 2 (two) times daily. Apply to feet  Tension headache -     propranolol (INDERAL) 40 MG tablet; Take 1 tablet (40 mg total) by mouth 2 (two) times daily.  Allergic rhinitis, unspecified allergic rhinitis type -     cetirizine (ZYRTEC) 10 MG tablet; Take 1 tablet (10 mg total) by mouth daily.  Onychomycosis of right great toe -     COMPLETE METABOLIC PANEL WITH GFR -     terbinafine (LAMISIL) 250 MG tablet; Take 1 tablet (250 mg total) by mouth daily.   F/u in 6 weeks for CMP f/u liver function test on lamisil lab draw  F/u in 6 months for chronic headaches   Dr. Armen Pickup  Tia de las uas (Nail Ringworm) Usted presenta una infeccin por hongos en las uas de los pies. La parte visible de las uas est formada por clulas muertas que no tienen suministro sanguneo que intervenga en la prevencin de las infecciones. La infeccin se produce debido a que los hongos estn en todas partes. Aprovecharn cualquier oportunidad para crecer Presenter, broadcasting. Esto incluye los tejidos de su cuerpo formados por [employer].  [place] uas tienen un crecimiento muy lento, requieren [place] 2 aos de tratamiento con medicamentos antimicticos. La infeccin involucra a toda la ua, hasta la base. Incluye aproximadamente 1/3 de la ua que no puede verse. Si el profesional le ha prescrito un medicamento por boca, [employer]. No podr ver ningn progreso hasta que hayan transcurrido entre 6 y 9 meses. No debe preocuparse. La curacin es lenta. Se debe a que el medicamento llega hasta la infeccin de manera muy lenta. Los hongos pueden vivir sobre las clulas muertas con poca o casi ninguna exposicin al suministro de [place]. Esta tambin es la razn  por la cual no se observa mejora en los primeros 6 meses. La ua comienza la curacin en la base, donde hay suministro de [place]. La medicacin tpica, como las cremas y los ungentos generalmente no son eficaces. Channing Mutters al profesional podrn elegir acelerar el proceso de curacin con la extraccin quirrgica de todas las uas. An as, Pharmacist, hospital 6 y 9 meses de medicamentos por va oral adicionales. Concurra a la Training and development officer profesional que lo asiste de acuerdo a lo que le haya indicado. Recuerde que no observar mejora durante al menos 6 meses. Consulte antes con el profesional si aparecen otros signos de infeccin (p. ej. enrojecimiento e hinchazn).   Esta informacin no tiene Theme park manager el consejo del mdico. Asegrese de hacerle al mdico cualquier pregunta que tenga.   Document Released: 05/22/2005 Document Revised: 12/27/2014 Elsevier Interactive Patient Education [employer]

## 2015-06-02 NOTE — Addendum Note (Signed)
Addended by: Dessa Phi on: 06/02/2015 09:07 AM   Modules accepted: Orders

## 2015-06-07 ENCOUNTER — Telehealth: Payer: Self-pay | Admitting: *Deleted

## 2015-06-07 NOTE — Telephone Encounter (Signed)
-----   Message from Dessa PhiJosalyn Funches, MD sent at 06/02/2015  9:07 AM EDT ----- Normal CMP Start lamisil Repeat CMP in 6 weeks

## 2015-06-07 NOTE — Telephone Encounter (Signed)
Date of verified by pt Normal CMP  Start lamisil  Repeat CMP in 6 wk Pt verbalized understanding Information given in Spanish

## 2015-06-08 ENCOUNTER — Ambulatory Visit: Payer: Self-pay | Attending: Family Medicine

## 2015-07-13 ENCOUNTER — Ambulatory Visit: Payer: Self-pay | Attending: Family Medicine

## 2015-07-13 DIAGNOSIS — B351 Tinea unguium: Secondary | ICD-10-CM

## 2015-07-13 LAB — COMPLETE METABOLIC PANEL WITH GFR
ALT: 10 U/L (ref 6–29)
AST: 16 U/L (ref 10–30)
Albumin: 4.1 g/dL (ref 3.6–5.1)
Alkaline Phosphatase: 61 U/L (ref 33–115)
BUN: 9 mg/dL (ref 7–25)
CO2: 23 mmol/L (ref 20–31)
CREATININE: 0.53 mg/dL (ref 0.50–1.10)
Calcium: 9.1 mg/dL (ref 8.6–10.2)
Chloride: 106 mmol/L (ref 98–110)
GFR, Est African American: >89 mL/min (ref >=60)
GFR, Est Non African American: >89 mL/min (ref >=60)
Glucose, Bld: 83 mg/dL (ref 65–99)
POTASSIUM: 3.9 mmol/L (ref 3.5–5.3)
Sodium: 141 mmol/L (ref 135–146)
Total Bilirubin: 0.2 mg/dL (ref 0.2–1.2)
Total Protein: 7.4 g/dL (ref 6.1–8.1)

## 2015-07-19 ENCOUNTER — Telehealth: Payer: Self-pay | Admitting: *Deleted

## 2015-07-19 NOTE — Telephone Encounter (Signed)
-----   Message from Dessa PhiJosalyn Funches, MD sent at 07/14/2015  9:39 AM EST ----- Normal CMP

## 2015-07-19 NOTE — Telephone Encounter (Signed)
Date of birth verified by pt Normal labs results given  Pt verbalize understanding

## 2015-11-27 ENCOUNTER — Ambulatory Visit: Payer: Self-pay | Attending: Family Medicine

## 2015-12-12 ENCOUNTER — Ambulatory Visit: Payer: Self-pay | Attending: Family Medicine | Admitting: Family Medicine

## 2015-12-12 ENCOUNTER — Encounter: Payer: Self-pay | Admitting: Family Medicine

## 2015-12-12 VITALS — BP 103/67 | HR 79 | Temp 98.5°F | Resp 16 | Ht 60.0 in | Wt 142.0 lb

## 2015-12-12 DIAGNOSIS — Z79899 Other long term (current) drug therapy: Secondary | ICD-10-CM | POA: Insufficient documentation

## 2015-12-12 DIAGNOSIS — G44209 Tension-type headache, unspecified, not intractable: Secondary | ICD-10-CM | POA: Insufficient documentation

## 2015-12-12 MED ORDER — CYCLOBENZAPRINE HCL 10 MG PO TABS: 10.0000 mg | ORAL_TABLET | Freq: Every evening | ORAL | Status: DC | PRN

## 2015-12-12 MED ORDER — PROPRANOLOL HCL 40 MG PO TABS: 40.0000 mg | ORAL_TABLET | Freq: Two times a day (BID) | ORAL | Status: DC

## 2015-12-12 MED ORDER — NAPROXEN 500 MG PO TABS: 500.0000 mg | ORAL_TABLET | Freq: Two times a day (BID) | ORAL | Status: DC

## 2015-12-12 NOTE — Assessment & Plan Note (Signed)
Tension headache   Restart propranolol Flexeril and naproxen to be used sparingly prn persistent headache

## 2015-12-12 NOTE — Patient Instructions (Addendum)
Cheryl HesselbachMaria was seen today for headache.  Diagnoses and all orders for this visit:  Tension headache -     propranolol (INDERAL) 40 MG tablet; Take 1 tablet (40 mg total) by mouth 2 (two) times daily. -     cyclobenzaprine (FLEXERIL) 10 MG tablet; Take 1 tablet (10 mg total) by mouth at bedtime as needed (headache). -     naproxen (NAPROSYN) 500 MG tablet; Take 1 tablet (500 mg total) by mouth 2 (two) times daily with a meal.   Take naproxen with flexeril with some food tonight. Go to bed and rest in a quiet dark room.  Start propranolol to prevent headaches.  F/u in 6 weeks for headache  Dr. Armen PickupFunches

## 2015-12-12 NOTE — Progress Notes (Signed)
Subjective:  Patient ID: Cheryl Espinoza, female    DOB: 31-May-1976  Age: 40 y.o. MRN: [id number]  Spanish interpreter used  CC: Headache   HPI Cheryl Espinoza presents for   1. Headache: Patient with chronic tension type headaches since age. She has HA currently for past 2 days. Headache is top of head, tightness. Associated with sensitivity to light, nausea. No vomiting. She is not taking propranolol.   Social History  Substance Use Topics  . Smoking status: Never Smoker   . Smokeless tobacco: Never Used  . Alcohol Use: No     Outpatient Prescriptions Prior to Visit  Medication Sig Dispense Refill  . cetirizine (ZYRTEC) 10 MG tablet Take 1 tablet (10 mg total) by mouth daily. 30 tablet 11  . omeprazole (PRILOSEC) 20 MG capsule Take 1 capsule (20 mg total) by mouth daily. 30 capsule 3  . propranolol (INDERAL) 40 MG tablet Take 1 tablet (40 mg total) by mouth 2 (two) times daily. 60 tablet 5  . terbinafine (LAMISIL AT) 1 % cream Apply 1 application topically 2 (two) times daily. Apply to feet 30 g 2  . terbinafine (LAMISIL) 250 MG tablet Take 1 tablet (250 mg total) by mouth daily. 30 tablet 2   No facility-administered medications prior to visit.    ROS Review of Systems  Constitutional: Negative for fever and chills.  HENT: Negative for ear pain, hearing loss and tinnitus.   Eyes: Positive for photophobia. Negative for visual disturbance.  Respiratory: Negative for shortness of breath.   Cardiovascular: Negative for chest pain.  Gastrointestinal: Positive for nausea. Negative for vomiting, abdominal pain and blood in stool.  Musculoskeletal: Negative for back pain and arthralgias.  Skin: Negative for rash.  Allergic/Immunologic: Negative for immunocompromised state.  Neurological: Positive for headaches. Negative for dizziness and light-headedness.  Hematological: Negative for adenopathy. Does not bruise/bleed easily.    Psychiatric/Behavioral: Negative for suicidal ideas and dysphoric mood.    Objective:  BP 103/67 mmHg  Pulse 79  Temp(Src) 98.5 F (36.9 C) (Oral)  Resp 16  Ht 5' (1.524 m)  Wt 142 lb (64.411 kg)  BMI 27.73 kg/m2  SpO2 100%  LMP 11/17/2015  BP/Weight 12/12/2015 06/01/2015 03/15/2015  Systolic BP 103 107 117  Diastolic BP 67 71 79  Wt. (Lbs) 142 143 144  BMI 27.73 27.46 27.65    Physical Exam  Constitutional: She is oriented to person, place, and time. She appears well-developed and well-nourished. No distress.  HENT:  Head: Normocephalic and atraumatic.  Right Ear: Tympanic membrane, external ear and ear canal normal.  Left Ear: Tympanic membrane, external ear and ear canal normal.  Nose: Mucosal edema present.  Mouth/Throat: Oropharynx is clear and moist.  Eyes: Conjunctivae and EOM are normal. Pupils are equal, round, and reactive to light.  Cardiovascular: Normal rate, regular rhythm, normal heart sounds and intact distal pulses.   Pulmonary/Chest: Effort normal and breath sounds normal.  Musculoskeletal: She exhibits no edema.  Neurological: She is alert and oriented to person, place, and time.  Skin: Skin is warm and dry. No rash noted.     Psychiatric: She has a normal mood and affect.     Assessment & Plan:   There are no diagnoses linked to this encounter. [name] was seen today for headache.  Diagnoses and all orders for this visit:  Tension headache -     propranolol (INDERAL) 40 MG tablet; Take 1 tablet (40 mg total) by mouth 2 (two) times daily. -  cyclobenzaprine (FLEXERIL) 10 MG tablet; Take 1 tablet (10 mg total) by mouth at bedtime as needed (headache). -     naproxen (NAPROSYN) 500 MG tablet; Take 1 tablet (500 mg total) by mouth 2 (two) times daily with a meal.   Meds ordered this encounter  Medications  . propranolol (INDERAL) 40 MG tablet    Sig: Take 1 tablet (40 mg total) by mouth 2 (two) times daily.    Dispense:  60 tablet    Refill:   5  . cyclobenzaprine (FLEXERIL) 10 MG tablet    Sig: Take 1 tablet (10 mg total) by mouth at bedtime as needed (headache).    Dispense:  10 tablet    Refill:  0  . naproxen (NAPROSYN) 500 MG tablet    Sig: Take 1 tablet (500 mg total) by mouth 2 (two) times daily with a meal.    Dispense:  20 tablet    Refill:  0    Follow-up: No Follow-up on file.   Dessa PhiJosalyn Vera Wishart MD

## 2015-12-12 NOTE — Progress Notes (Signed)
C/C headache x 2 days  Pt stated pain on the lt eye due to HA Pain scale #3 No suicidal thoughts in the past two weeks

## 2015-12-13 MED FILL — ?PROPRANOLOL 40 MG TABLET: 40 | 30 days supply | Qty: 60 | Fill #0

## 2015-12-13 MED FILL — ?CYCLOBENZAPRINE 10 MG TABL: 10 | 10 days supply | Qty: 10 | Fill #0

## 2015-12-13 MED FILL — NAPROXEN 500 MG TABLET: 500 | 10 days supply | Qty: 20 | Fill #0

## 2016-01-29 ENCOUNTER — Other Ambulatory Visit: Payer: Self-pay | Admitting: Family Medicine

## 2016-03-20 ENCOUNTER — Ambulatory Visit: Payer: Self-pay | Attending: Family Medicine

## 2016-03-22 ENCOUNTER — Ambulatory Visit: Payer: Self-pay | Attending: Family Medicine | Admitting: Physician Assistant

## 2016-03-22 DIAGNOSIS — G44209 Tension-type headache, unspecified, not intractable: Secondary | ICD-10-CM

## 2016-03-22 MED ORDER — CYCLOBENZAPRINE HCL 10 MG PO TABS: ORAL_TABLET | ORAL | 0 refills | Status: DC

## 2016-03-22 MED ORDER — NAPROXEN 500 MG PO TABS: 500.0000 mg | ORAL_TABLET | Freq: Two times a day (BID) | ORAL | 0 refills | Status: DC

## 2016-03-22 MED FILL — CYCLOBENZAPRINE 10 MG TAB: 10 | 30 days supply | Qty: 30 | Fill #0

## 2016-03-22 NOTE — Progress Notes (Signed)
Patient ID: Cheryl Espinoza, female   DOB: [birth year], 40 y.o.   MRN: [id number]   Cheryl Espinoza, is a 40 y.o. female  QMV:[id number]  MWU:[id number]  DOB - 1976/01/23  Subjective:  Chief Complaint and HPI: Cheryl Espinoza is a 40 y.o. female here today for headaches.  She had a headache yesterday and part of today.  It was mostly relieved with tylenol.  She has headaches 1-2X per week.  The headaches are in the top of her head and the back of her neck.  They are unchanged in nature.  No photophobia, no vision changes.  Occasionally she has nausea without vomiting with her headaches.  Naproxen and flexeril have helped in the past, but she is out of these medications.    ED/Hospital notes reviewed.    ROS:   Constitutional:  No f/c, No night sweats, No unexplained weight loss. EENT:  No vision changes, No blurry vision, No hearing changes. No mouth, throat, or ear problems.  Respiratory: No cough, No SOB Cardiac: No CP, no palpitations GI:  No abd pain, occasional nausea without V/D. GU: No Urinary s/sx Musculoskeletal: No joint pain Neuro: +headache, no dizziness, no motor weakness.  Skin: No rash Endocrine:  No polydipsia. No polyuria.  Psych: Denies SI/HI  No problems updated.  ALLERGIES: No Known Allergies  PAST MEDICAL HISTORY: No past medical history on file.  MEDICATIONS AT HOME: Prior to Admission medications   Medication Sig Start Date End Date Taking? Authorizing Provider  cetirizine (ZYRTEC) 10 MG tablet Take 1 tablet (10 mg total) by mouth daily. 06/01/15  Yes Josalyn Funches, MD  cyclobenzaprine (FLEXERIL) 10 MG tablet 1/2 -1 tab prn Headache or muscle spasm at bedtime 03/22/16  Yes Marzella Schlein McClung, PA-C  omeprazole (PRILOSEC) 20 MG capsule Take 1 capsule (20 mg total) by mouth daily. 07/14/14  Yes Josalyn Funches, MD  naproxen (NAPROSYN) 500 MG tablet Take 1 tablet (500 mg total) by mouth 2 (two) times daily with a meal. Prn headache or  pain 03/22/16   [name], PA-C  propranolol (INDERAL) 40 MG tablet Take 1 tablet (40 mg total) by mouth 2 (two) times daily. 12/12/15   Josalyn Funches, MD     Objective:  EXAM:   Vitals:   03/22/16 1514  BP: 106/71  Pulse: 72  Resp: 16  Temp: 98.5 F (36.9 C)  TempSrc: Oral  Weight: 146 lb (66.2 kg)    General appearance : A&OX3. NAD. Non-toxic-appearing HEENT: Atraumatic and Normocephalic.  PERRLA. EOM intact.  TM clear B. Mouth-MMM, post pharynx WNL w/o erythema, No PND. Neck: supple, no JVD. No cervical lymphadenopathy. No thyromegaly Chest/Lungs:  Breathing-non-labored, Good air entry bilaterally, breath sounds normal without rales, rhonchi, or wheezing  CVS: S1 S2 regular, no murmurs, gallops, rubs  Abdomen: Bowel sounds present, Non tender and not distended with no gaurding, rigidity or rebound. Extremities: Bilateral Lower Ext shows no edema, both legs are warm to touch with = pulse throughout Neurology:  CN II-XII grossly intact, Non focal.   Psych:  TP linear. J/I WNL. Normal speech. Appropriate eye contact and affect.  Skin:  No Rash  Data Review No results found for: HGBA1C   Assessment & Plan   1. Tension headache-not new - cyclobenzaprine (FLEXERIL) 10 MG tablet; 1/2 -1 tab prn Headache or muscle spasm at bedtime  Dispense: 30 tablet; Refill: 0 Naproxen  bid prn HA or pain HA warnings discussed.   Patient have been counseled extensively about nutrition and  exercise  Return in about 3 months (around 06/22/2016) for CPE and labwork.  The patient was given clear instructions to go to ER or return to medical center if symptoms don't improve, worsen or new problems develop. The patient verbalized understanding. The patient was told to call to get lab results if they haven't heard anything in the next week.     Georgian Co, PA-C [facility] [place], [place] [phone]   03/22/2016, 5:09 PM

## 2016-03-22 NOTE — Progress Notes (Signed)
c/o HA x 2 days (dull pain). States this is a f/u for medication and yes medication has helped.

## 2016-03-26 MED FILL — ?PROPRANOLOL 40 MG TABLET: 40 | 30 days supply | Qty: 60 | Fill #1

## 2016-03-26 MED FILL — NAPROXEN 500 MG TABLET: 500 | 10 days supply | Qty: 20 | Fill #0

## 2016-05-03 ENCOUNTER — Ambulatory Visit: Payer: Self-pay | Attending: Family Medicine | Admitting: Family Medicine

## 2016-05-03 VITALS — BP 116/76 | HR 85 | Temp 98.3°F | Wt 146.0 lb

## 2016-05-03 DIAGNOSIS — B9689 Other specified bacterial agents as the cause of diseases classified elsewhere: Secondary | ICD-10-CM

## 2016-05-03 DIAGNOSIS — N898 Other specified noninflammatory disorders of vagina: Secondary | ICD-10-CM

## 2016-05-03 DIAGNOSIS — Z23 Encounter for immunization: Secondary | ICD-10-CM

## 2016-05-03 DIAGNOSIS — N760 Acute vaginitis: Secondary | ICD-10-CM

## 2016-05-03 DIAGNOSIS — L0591 Pilonidal cyst without abscess: Secondary | ICD-10-CM | POA: Insufficient documentation

## 2016-05-03 DIAGNOSIS — A499 Bacterial infection, unspecified: Secondary | ICD-10-CM

## 2016-05-03 DIAGNOSIS — G44209 Tension-type headache, unspecified, not intractable: Secondary | ICD-10-CM

## 2016-05-03 MED ORDER — SUMATRIPTAN SUCCINATE 25 MG PO TABS: 25.0000 mg | ORAL_TABLET | ORAL | 0 refills | Status: DC | PRN

## 2016-05-03 MED ORDER — PROPRANOLOL HCL 40 MG PO TABS: 40.0000 mg | ORAL_TABLET | Freq: Two times a day (BID) | ORAL | 5 refills | Status: DC

## 2016-05-03 MED ORDER — KETOROLAC TROMETHAMINE 60 MG/2ML IM SOLN
60.0000 mg | Freq: Once | INTRAMUSCULAR | Status: AC
  Administered 2016-05-03: 60 mg via INTRAMUSCULAR

## 2016-05-03 NOTE — Patient Instructions (Addendum)
Cheryl Espinoza was seen today for follow-up.  Diagnoses and all orders for this visit:  Tension headache -     SUMAtriptan (IMITREX) 25 MG tablet; Take 1 tablet (25 mg total) by mouth every 2 (two) hours as needed for migraine. May repeat in 2 hours if headache persists or recurs. -     ketorolac (TORADOL) injection 60 mg; Inject 2 mLs (60 mg total) into the muscle once. -     propranolol (INDERAL) 40 MG tablet; Take 1 tablet (40 mg total) by mouth 2 (two) times daily.  Vaginal discharge -     Cervicovaginal ancillary only  Pilonidal cyst without abscess   F/u in 2 months for headache follow up   Dr. Janeece AgeeFunches   Quiste pilonidal (Pilonidal Cyst) Un quiste pilonidal es una bolsa llena de lquido. Se forma debajo de la piel cerca del coxis, en el hoyuelo que se encuentra en la parte superior del pliegue de Consecoentre los glteos. Un quiste pilonidal que no es grande ni est infectado probablemente no causar sntomas o problemas. Si el quiste se irrita o se infecta, puede llenarse de pus. Esto provoca dolor e inflamacin (absceso pilonidal). Es posible que el quiste infectado se tenga que tratar con medicamentos, drenar o extirpar. CAUSAS La causa del quiste pilonidal se desconoce. Una causa puede ser cuando un pelo crece dentro de la piel (pelo encarnado). FACTORES DE RIESGO Los quistes pilonidales son ms comunes en los nios y los hombres. Entre los factores de riesgo se incluyen los siguientes:  Tener mucha cantidad de pelo cerca del pliegue entre los glteos.  Tener sobrepeso.  Tener un hoyuelo pilonidal.  Usar ropa ajustada.  No baarse o ducharse con frecuencia.  Estar sentado durante largos perodos. SIGNOS Y SNTOMAS Los signos y los sntomas de un quiste pilonidal pueden incluir los siguientes:  Enrojecimiento.  Dolor y sensibilidad.  Calor.  Hinchazn.  Pus.  Cheryl Espinoza. DIAGNSTICO El mdico puede diagnosticar un quiste pilonidal de acuerdo con sus sntomas y un examen  fsico. El mdico puede indicar un anlisis de sangre para determinar si hay infeccin. Si el quiste drena pus, el mdico puede tomar una muestra de la secrecin para Sales promotion account executiveanalizar en el laboratorio. TRATAMIENTO El tratamiento habitual para un quiste pilonidal infectado es la Azerba[place]. Es posible que tambin tenga que tomar medicamentos antes de la Azerbaijanciruga. El tipo de Azerbaijanciruga que le realicen depender del tamao y la gravedad del quiste infectado. Entre los distintos tipos de Azerbaijanciruga se incluyen los siguientes:  Incisin y Midwifesecrecin. Este es un procedimiento en el que se abre y se drena el quiste.  Marsupializacin. En este procedimiento, un quiste o absceso grande puede abrirse y United Technologies Corporationmantenerse abierto al Hess Corporationsuturar los bordes de la piel a las paredes del Arvinquiste.  Extirpacin del quiste. En este procedimiento se abre la piel y se extirpa todo el quiste o una parte de [place]. INSTRUCCIONES PARA EL CUIDADO EN EL HOGAR  Si se someti a [place]una ciruga, siga todas las instrucciones del cirujano.  Tome los medicamentos solamente como se lo haya indicado el mdico.  Si le recetaron antibiticos, asegrese de terminarlos, incluso si comienza a sentirse mejor.  Mantenga el rea que rodea el quiste pilonidal limpia y [place] Vill[place].  Limpie el rea como se lo haya indicado el mdico. Seque bien el rea con una toalla limpia dando golpecitos. No la frote ya que Transport plannerpuede sangrar.  Elimine el vello del rea que rodea el quiste como se lo haya indicado el mdico.  No  use ropa ajustada ni permanezca sentado en la misma posicin durante largos perodos.  Hay muchas maneras distintas de cerrar y cubrir una incisin, como puntos, pegamento para la piel y [place]. Siga todas las indicaciones del mdico respecto a lo siguiente:  Cuidar la herida.  Cambiar y Oceanographer el vendaje.  Quitar el cierre de la incisin. SOLICITE ATENCIN MDICA SI:   Tiene secrecin, enrojecimiento, hinchazn o Art therapist del quiste.  Tiene  fiebre.   Esta informacin no tiene Theme park manager el consejo del mdico. Asegrese de hacerle al mdico cualquier pregunta que tenga.   Document Released: 05/22/2005 Document Revised: 09/02/2014 Elsevier Interactive Patient Education [employer]

## 2016-05-03 NOTE — Progress Notes (Signed)
Subjective:  Patient ID: Cheryl Espinoza, female    DOB: 01/30/1976  Age: 40 y.o. MRN: [id number]  CC: Follow-up   HPI Cheryl Espinoza presents for  Spanish interpreter Lafonda MossesDiana ID # [id number]  1. Headache: off an on for 12 year. She hit the R upper side of her head 12 years ago and still has intermittent pain in head with sensitivity on scalp. Pain is worsening. She also has fatigue, sensitivity to light and tearing. No sensitivity to sound. Right pain level is 3/10.   2.    Social History  Substance Use Topics  . Smoking status: Never Smoker  . Smokeless tobacco: Never Used  . Alcohol use No    Outpatient Medications Prior to Visit  Medication Sig Dispense Refill  . cetirizine (ZYRTEC) 10 MG tablet Take 1 tablet (10 mg total) by mouth daily. 30 tablet 11  . cyclobenzaprine (FLEXERIL) 10 MG tablet 1/2 -1 tab prn Headache or muscle spasm at bedtime 30 tablet 0  . naproxen (NAPROSYN) 500 MG tablet Take 1 tablet (500 mg total) by mouth 2 (two) times daily with a meal. Prn headache or pain 60 tablet 0  . omeprazole (PRILOSEC) 20 MG capsule Take 1 capsule (20 mg total) by mouth daily. 30 capsule 3  . propranolol (INDERAL) 40 MG tablet Take 1 tablet (40 mg total) by mouth 2 (two) times daily. 60 tablet 5   No facility-administered medications prior to visit.     ROS Review of Systems  Constitutional: Negative for chills and fever.  HENT: Negative for ear pain and hearing loss.   Eyes: Positive for photophobia. Negative for visual disturbance.  Respiratory: Negative for shortness of breath.   Cardiovascular: Negative for chest pain.  Gastrointestinal: Negative for abdominal pain and blood in stool.  Genitourinary: Positive for vaginal discharge.  Musculoskeletal: Negative for arthralgias and back pain.  Skin: Negative for rash.  Allergic/Immunologic: Negative for immunocompromised state.  Neurological: Positive for dizziness and headaches.  Hematological:  Negative for adenopathy. Does not bruise/bleed easily.  Psychiatric/Behavioral: Negative for dysphoric mood and suicidal ideas.    Objective:  BP 116/76 (BP Location: Left Arm, Patient Position: Sitting, Cuff Size: Small)   Pulse 85   Temp 98.3 F (36.8 C) (Oral)   Wt 146 lb (66.2 kg)   SpO2 100%   BMI 28.51 kg/m   BP/Weight 05/03/2016 03/22/2016 12/12/2015  Systolic BP 116 106 103  Diastolic BP 76 71 67  Wt. (Lbs) 146 146 142  BMI 28.51 28.51 27.73   Physical Exam  Constitutional: She is oriented to person, place, and time. She appears well-developed and well-nourished. No distress.  HENT:  Head: Normocephalic and atraumatic.  Cardiovascular: Normal rate, regular rhythm, normal heart sounds and intact distal pulses.   Pulmonary/Chest: Effort normal and breath sounds normal.  Genitourinary: Uterus normal. Pelvic exam was performed with patient prone. There is no rash, tenderness or lesion on the right labia. There is no rash, tenderness or lesion on the left labia. Cervix exhibits no motion tenderness, no discharge and no friability. Vaginal discharge found.  Musculoskeletal: She exhibits no edema.  Lymphadenopathy:       Right: No inguinal adenopathy present.       Left: No inguinal adenopathy present.  Neurological: She is alert and oriented to person, place, and time.  Skin: Skin is warm and dry. No rash noted.     Psychiatric: She has a normal mood and affect.     Assessment & Plan:  [name] was seen today for follow-up.  Diagnoses and all orders for this visit:  Tension headache -     SUMAtriptan (IMITREX) 25 MG tablet; Take 1 tablet (25 mg total) by mouth every 2 (two) hours as needed for migraine. May repeat in 2 hours if headache persists or recurs. -     ketorolac (TORADOL) injection 60 mg; Inject 2 mLs (60 mg total) into the muscle once. -     propranolol (INDERAL) 40 MG tablet; Take 1 tablet (40 mg total) by mouth 2 (two) times daily.  Vaginal discharge -      Cervicovaginal ancillary only  Pilonidal cyst without abscess  Encounter for immunization -     Flu Vaccine QUAD 36+ mos IM   There are no diagnoses linked to this encounter.  Meds ordered this encounter  Medications  . SUMAtriptan (IMITREX) 25 MG tablet    Sig: Take 1 tablet (25 mg total) by mouth every 2 (two) hours as needed for migraine. May repeat in 2 hours if headache persists or recurs.    Dispense:  10 tablet    Refill:  0  . ketorolac (TORADOL) injection 60 mg  . propranolol (INDERAL) 40 MG tablet    Sig: Take 1 tablet (40 mg total) by mouth 2 (two) times daily.    Dispense:  60 tablet    Refill:  5    Follow-up: Return in about 2 months (around 07/03/2016) for headahce .   Dessa Phi MD

## 2016-05-03 NOTE — Progress Notes (Signed)
Bump on anus for 2 weeks, and headaches.

## 2016-05-05 NOTE — Assessment & Plan Note (Signed)
Chronic tension HA Continue propranolol Add imitrex toradol shot for HA today

## 2016-05-05 NOTE — Assessment & Plan Note (Signed)
Small papule in gluteal area concerning for pilonidal cyst Reassurance

## 2016-05-06 MED FILL — ?PROPRANOLOL 40 MG TABLET: 40 | 30 days supply | Qty: 60 | Fill #0

## 2016-05-06 MED FILL — SUMATRIPTAN SUCC 25 MG TAB: 25 | 30 days supply | Qty: 10 | Fill #0

## 2016-05-07 LAB — CERVICOVAGINAL ANCILLARY ONLY
CHLAMYDIA, DNA PROBE: NEGATIVE
NEISSERIA GONORRHEA: NEGATIVE
Wet Prep (BD Affirm): POSITIVE — AB

## 2016-05-07 MED ORDER — FLUCONAZOLE 150 MG PO TABS: 150.0000 mg | ORAL_TABLET | Freq: Once | ORAL | 0 refills | Status: AC

## 2016-05-07 MED ORDER — METRONIDAZOLE 500 MG PO TABS: 500.0000 mg | ORAL_TABLET | Freq: Two times a day (BID) | ORAL | 0 refills | Status: DC

## 2016-05-07 NOTE — Addendum Note (Signed)
Addended by: Dessa PhiFUNCHES, Lorris Carducci on: 05/07/2016 05:50 PM   Modules accepted: Orders

## 2016-05-08 ENCOUNTER — Telehealth: Payer: Self-pay

## 2016-05-08 MED FILL — metroNIDAZOLE 500 MG TABS: 500 | 7 days supply | Qty: 14 | Fill #0

## 2016-05-08 MED FILL — FLUCONAZOLE 150 MG TABLET: 150 | 1 days supply | Qty: 1 | Fill #0

## 2016-05-08 NOTE — Telephone Encounter (Signed)
VM was left for pt to return phone call.

## 2016-05-15 ENCOUNTER — Telehealth: Payer: Self-pay

## 2016-05-15 NOTE — Telephone Encounter (Signed)
Vm was left on 9/20 for pt to return call for lab results.

## 2016-05-31 NOTE — Progress Notes (Signed)
Letter sent.

## 2016-06-06 ENCOUNTER — Telehealth: Payer: Self-pay | Admitting: Family Medicine

## 2016-06-06 NOTE — Telephone Encounter (Signed)
Patient received nurse letter. Please call patient

## 2016-06-07 NOTE — Telephone Encounter (Signed)
Pt was called on 10/13 to talk about her results. Pt did not answer phone. It is ok to give her results if she calls back.    Bacterial vaginosis (BV) on wet prep This is not an STD This is an overgrowth of bacteria that can be treated with flagyl 500 mg twice daily followed by difucan 150 mg once to prevent yeast.  I have sent these to your pharmacy Do not mix flagyl with alcohol as this will cause stomach upset   Negative screening GC/chlam

## 2016-07-17 ENCOUNTER — Ambulatory Visit: Payer: Self-pay | Attending: Internal Medicine

## 2016-08-06 ENCOUNTER — Ambulatory Visit: Payer: Self-pay | Attending: Family Medicine | Admitting: Family Medicine

## 2016-08-06 ENCOUNTER — Encounter: Payer: Self-pay | Admitting: Family Medicine

## 2016-08-06 VITALS — BP 116/80 | HR 89 | Temp 98.3°F | Ht 60.0 in | Wt 150.6 lb

## 2016-08-06 DIAGNOSIS — Z79899 Other long term (current) drug therapy: Secondary | ICD-10-CM | POA: Insufficient documentation

## 2016-08-06 DIAGNOSIS — N760 Acute vaginitis: Secondary | ICD-10-CM | POA: Insufficient documentation

## 2016-08-06 DIAGNOSIS — B9689 Other specified bacterial agents as the cause of diseases classified elsewhere: Secondary | ICD-10-CM

## 2016-08-06 DIAGNOSIS — N898 Other specified noninflammatory disorders of vagina: Secondary | ICD-10-CM

## 2016-08-06 MED ORDER — METRONIDAZOLE 500 MG PO TABS: 500.0000 mg | ORAL_TABLET | Freq: Two times a day (BID) | ORAL | 0 refills | Status: DC

## 2016-08-06 MED ORDER — FLUCONAZOLE 150 MG PO TABS: 150.0000 mg | ORAL_TABLET | ORAL | 0 refills | Status: DC

## 2016-08-06 MED FILL — ?METRONIDAZOLE 500 MG TABLE: 500 | 7 days supply | Qty: 14 | Fill #0

## 2016-08-06 MED FILL — FLUCONAZOLE 150 MG TABLET: 150 | 6 days supply | Qty: 2 | Fill #0

## 2016-08-06 NOTE — Progress Notes (Signed)
SUBJECTIVE:  40 y.o. female for annual routine Pap.  She endorses increase discharge and vaginal irritation prior to menstrual period. She did not take treatment for BV in 04/2016.  Social History  Substance Use Topics  . Smoking status: Never Smoker  . Smokeless tobacco: Never Used  . Alcohol use No   Current Outpatient Prescriptions  Medication Sig Dispense Refill  . cetirizine (ZYRTEC) 10 MG tablet Take 1 tablet (10 mg total) by mouth daily. 30 tablet 11  . metroNIDAZOLE (FLAGYL) 500 MG tablet Take 1 tablet (500 mg total) by mouth 2 (two) times daily. 14 tablet 0  . omeprazole (PRILOSEC) 20 MG capsule Take 1 capsule (20 mg total) by mouth daily. 30 capsule 3  . propranolol (INDERAL) 40 MG tablet Take 1 tablet (40 mg total) by mouth 2 (two) times daily. 60 tablet 5  . SUMAtriptan (IMITREX) 25 MG tablet Take 1 tablet (25 mg total) by mouth every 2 (two) hours as needed for migraine. May repeat in 2 hours if headache persists or recurs. 10 tablet 0   No current facility-administered medications for this visit.    Allergies: Patient has no known allergies.  Patient's last menstrual period was 07/25/2016.  ROS:  Feeling well. No dyspnea or chest pain on exertion.  No abdominal pain, change in bowel habits, black or bloody stools.  No urinary tract symptoms. GYN ROS: no breast pain or new or enlarging lumps on self exam, she complains of vaginal discharge. No neurological complaints.  OBJECTIVE:  The patient appears well, alert, oriented x 3, in no distress. BP 116/80 (BP Location: Left Arm, Patient Position: Sitting, Cuff Size: Small)   Pulse 89   Temp 98.3 F (36.8 C) (Oral)   Ht 5' (1.524 m)   Wt 150 lb 9.6 oz (68.3 kg)   LMP 07/25/2016   SpO2 100%   BMI 29.41 kg/m  ENT normal.  Neck supple. No adenopathy or thyromegaly. PERLA. Lungs are clear, good air entry, no wheezes, rhonchi or rales. S1 and S2 normal, no murmurs, regular rate and rhythm. Abdomen soft without tenderness,  guarding, mass or organomegaly. Extremities show no edema, normal peripheral pulses. Neurological is normal, no focal findings.  BREAST EXAM: breasts appear normal, no suspicious masses, no skin or nipple changes or axillary nodes  PELVIC EXAM: VULVA: normal appearing vulva with no masses, tenderness or lesions, VAGINA: normal appearing vagina with normal color and discharge, no lesions, vaginal discharge - white and thin, CERVIX: normal appearing cervix without lesions, UTERUS: uterus is normal size, shape, consistency and nontender, ADNEXA: normal adnexa in size, nontender and no masses  ASSESSMENT:  well woman Persistent BV due to lack of treatment  PLAN:  Treat BV with flagyl followed by diflucan

## 2016-08-06 NOTE — Patient Instructions (Addendum)
Byrd HesselbachMaria was seen today for annual exam.  Diagnoses and all orders for this visit:  Vaginal discharge -     Cervicovaginal ancillary only  BV (bacterial vaginosis) -     fluconazole (DIFLUCAN) 150 MG tablet; Take 1 tablet (150 mg total) by mouth every 3 (three) days. -     metroNIDAZOLE (FLAGYL) 500 MG tablet; Take 1 tablet (500 mg total) by mouth 2 (two) times daily.   F/u in 6 months for headache   Dr. Armen PickupFunches

## 2016-08-08 LAB — CERVICOVAGINAL ANCILLARY ONLY: Wet Prep (BD Affirm): POSITIVE — AB

## 2016-11-01 ENCOUNTER — Ambulatory Visit: Payer: Self-pay | Attending: Family Medicine

## 2016-11-26 ENCOUNTER — Encounter: Payer: Self-pay | Admitting: Family Medicine

## 2016-11-26 ENCOUNTER — Ambulatory Visit: Payer: Self-pay | Attending: Family Medicine | Admitting: Family Medicine

## 2016-11-26 VITALS — BP 113/75 | HR 76 | Temp 97.8°F | Ht 60.0 in | Wt 145.0 lb

## 2016-11-26 DIAGNOSIS — Z8669 Personal history of other diseases of the nervous system and sense organs: Secondary | ICD-10-CM

## 2016-11-26 DIAGNOSIS — R42 Dizziness and giddiness: Secondary | ICD-10-CM | POA: Insufficient documentation

## 2016-11-26 DIAGNOSIS — E559 Vitamin D deficiency, unspecified: Secondary | ICD-10-CM | POA: Insufficient documentation

## 2016-11-26 DIAGNOSIS — Z79899 Other long term (current) drug therapy: Secondary | ICD-10-CM | POA: Insufficient documentation

## 2016-11-26 DIAGNOSIS — G44209 Tension-type headache, unspecified, not intractable: Secondary | ICD-10-CM | POA: Insufficient documentation

## 2016-11-26 DIAGNOSIS — D649 Anemia, unspecified: Secondary | ICD-10-CM | POA: Insufficient documentation

## 2016-11-26 DIAGNOSIS — H538 Other visual disturbances: Secondary | ICD-10-CM | POA: Insufficient documentation

## 2016-11-26 LAB — POCT GLYCOSYLATED HEMOGLOBIN (HGB A1C): HEMOGLOBIN A1C: 5.7

## 2016-11-26 MED ORDER — SUMATRIPTAN SUCCINATE 25 MG PO TABS: 25.0000 mg | ORAL_TABLET | ORAL | 2 refills | Status: DC | PRN

## 2016-11-26 MED ORDER — PROPRANOLOL HCL 40 MG PO TABS: 40.0000 mg | ORAL_TABLET | Freq: Two times a day (BID) | ORAL | 5 refills | Status: DC

## 2016-11-26 MED FILL — PROPRANOLOL 40 MG TABLET: 40 | 30 days supply | Qty: 60 | Fill #0

## 2016-11-26 NOTE — Patient Instructions (Addendum)
Cheryl Espinoza was seen today for headache.  Diagnoses and all orders for this visit:  History of blurry vision -     Vitamin D, 25-hydroxy -     TSH -     CBC -     HgB A1c  Tension headache -     SUMAtriptan (IMITREX) 25 MG tablet; Take 1 tablet (25 mg total) by mouth every 2 (two) hours as needed for migraine. May repeat in 2 hours if headache persists or recurs. Do not exceed 2 doses in 7 day period -     propranolol (INDERAL) 40 MG tablet; Take 1 tablet (40 mg total) by mouth 2 (two) times daily.    F/u in 6 weeks for chronic headaches   Dr. Armen Pickup

## 2016-11-26 NOTE — Progress Notes (Signed)
Subjective:  Patient ID: Cheryl Espinoza, female    DOB: December 08, 1975  Age: 41 y.o. MRN: [id number]  CC: Headache   HPI Cheryl Espinoza presents for  Spanish interpreter Alejandra  ID # [id number]  1. Headache: off an on for 12 year. She hit the R upper side of her head 12 years ago and still has intermittent pain in head with tenderness and sensitivity on scalp on the R side. She also has pain in back of head.  She also has fatigue, sensitivity to light and tearing. No sensitivity to sound. Right pain level is 3/10. She has ran out of propranolol and Imitrex. She is currently only taking tylenol for pain without relief. She has recent episode of blurry vision and darkening of vision while watching tv. She also has intermittent dizziness.   Social History  Substance Use Topics  . Smoking status: Never Smoker  . Smokeless tobacco: Never Used  . Alcohol use No    Outpatient Medications Prior to Visit  Medication Sig Dispense Refill  . omeprazole (PRILOSEC) 20 MG capsule Take 1 capsule (20 mg total) by mouth daily. 30 capsule 3  . propranolol (INDERAL) 40 MG tablet Take 1 tablet (40 mg total) by mouth 2 (two) times daily. 60 tablet 5  . SUMAtriptan (IMITREX) 25 MG tablet Take 1 tablet (25 mg total) by mouth every 2 (two) hours as needed for migraine. May repeat in 2 hours if headache persists or recurs. 10 tablet 0  . cetirizine (ZYRTEC) 10 MG tablet Take 1 tablet (10 mg total) by mouth daily. (Patient not taking: Reported on 11/26/2016) 30 tablet 11  . fluconazole (DIFLUCAN) 150 MG tablet Take 1 tablet (150 mg total) by mouth every 3 (three) days. (Patient not taking: Reported on 11/26/2016) 2 tablet 0  . metroNIDAZOLE (FLAGYL) 500 MG tablet Take 1 tablet (500 mg total) by mouth 2 (two) times daily. (Patient not taking: Reported on 11/26/2016) 14 tablet 0   No facility-administered medications prior to visit.     ROS Review of Systems  Constitutional: Negative for  chills and fever.  HENT: Negative for ear pain and hearing loss.   Eyes: Positive for photophobia and visual disturbance.  Respiratory: Negative for shortness of breath.   Cardiovascular: Negative for chest pain.  Gastrointestinal: Negative for abdominal pain and blood in stool.  Genitourinary: Negative for vaginal discharge.  Musculoskeletal: Negative for arthralgias and back pain.  Skin: Negative for rash.  Allergic/Immunologic: Negative for immunocompromised state.  Neurological: Positive for dizziness and headaches.  Hematological: Negative for adenopathy. Does not bruise/bleed easily.  Psychiatric/Behavioral: Negative for dysphoric mood and suicidal ideas.    Objective:  BP 113/75   Pulse 76   Temp 97.8 F (36.6 C) (Oral)   Ht 5' (1.524 m)   Wt 145 lb (65.8 kg)   LMP 11/17/2016   SpO2 98%   BMI 28.32 kg/m   BP/Weight 11/26/2016 08/06/2016 05/03/2016  Systolic BP 113 116 116  Diastolic BP 75 80 76  Wt. (Lbs) 145 150.6 146  BMI 28.32 29.41 28.51   Physical Exam  Constitutional: She is oriented to person, place, and time. She appears well-developed and well-nourished. No distress.  HENT:  Head: Normocephalic and atraumatic.  Eyes: Conjunctivae and EOM are normal. Pupils are equal, round, and reactive to light.  Cardiovascular: Normal rate, regular rhythm, normal heart sounds and intact distal pulses.   Pulmonary/Chest: Effort normal and breath sounds normal.  Genitourinary: Pelvic exam was performed with patient  prone. Cervix exhibits no motion tenderness, no discharge and no friability.  Musculoskeletal: She exhibits no edema.  Neurological: She is alert and oriented to person, place, and time.  Skin: Skin is warm and dry. No rash noted.  Psychiatric: She has a normal mood and affect.   Lab Results  Component Value Date   HGBA1C 5.7 11/26/2016     Assessment & Plan:  Cheryl Espinoza was seen today for headache.  Diagnoses and all orders for this visit:  History of blurry  vision -     Vitamin D, 25-hydroxy -     TSH -     CBC -     HgB A1c  Tension headache -     SUMAtriptan (IMITREX) 25 MG tablet; Take 1 tablet (25 mg total) by mouth every 2 (two) hours as needed for migraine. May repeat in 2 hours if headache persists or recurs. Do not exceed 2 doses in 7 day period -     propranolol (INDERAL) 40 MG tablet; Take 1 tablet (40 mg total) by mouth 2 (two) times daily.  Vitamin D deficiency -     Vitamin D, Ergocalciferol, (DRISDOL) 50000 units CAPS capsule; Take 1 capsule (50,000 Units total) by mouth every 7 (seven) days.  Anemia, unspecified type -     ferrous sulfate 325 (65 FE) MG tablet; Take 1 tablet (325 mg total) by mouth 2 (two) times daily with a meal.   There are no diagnoses linked to this encounter.  No orders of the defined types were placed in this encounter.   Follow-up: Return in about 6 weeks (around 01/07/2017) for headaches .   Dessa Phi MD

## 2016-11-27 LAB — CBC
HEMATOCRIT: 30.8 % — AB (ref 34.0–46.6)
HEMOGLOBIN: 9.4 g/dL — AB (ref 11.1–15.9)
MCH: 24.2 pg — ABNORMAL LOW (ref 26.6–33.0)
MCHC: 30.5 g/dL — ABNORMAL LOW (ref 31.5–35.7)
MCV: 79 fL (ref 79–97)
Platelets: 390 10*3/uL — ABNORMAL HIGH (ref 150–379)
RBC: 3.89 x10E6/uL (ref 3.77–5.28)
RDW: 16.5 % — ABNORMAL HIGH (ref 12.3–15.4)
WBC: 7.6 10*3/uL (ref 3.4–10.8)

## 2016-11-27 LAB — TSH: TSH: 1.04 u[IU]/mL (ref 0.450–4.500)

## 2016-11-27 LAB — VITAMIN D 25 HYDROXY (VIT D DEFICIENCY, FRACTURES): Vit D, 25-Hydroxy: 13.7 ng/mL — ABNORMAL LOW (ref 30.0–100.0)

## 2016-11-28 DIAGNOSIS — D649 Anemia, unspecified: Secondary | ICD-10-CM | POA: Insufficient documentation

## 2016-11-28 MED ORDER — VITAMIN D (ERGOCALCIFEROL) 1.25 MG (50000 UNIT) PO CAPS: 50000.0000 [IU] | ORAL_CAPSULE | ORAL | 0 refills | Status: DC

## 2016-11-28 MED ORDER — FERROUS SULFATE 325 (65 FE) MG PO TABS: 325.0000 mg | ORAL_TABLET | Freq: Two times a day (BID) | ORAL | 3 refills | Status: DC

## 2016-11-28 NOTE — Assessment & Plan Note (Signed)
Anemia Patient having menstrual periods Iron supplement ordered

## 2016-11-28 NOTE — Assessment & Plan Note (Signed)
Labs confirm vit D deficiency Supplement ordered

## 2016-11-28 NOTE — Assessment & Plan Note (Signed)
Tension HA with blurry vision Normal vision exam Plan: Restart propranolol and imitrex

## 2016-11-29 ENCOUNTER — Telehealth: Payer: Self-pay

## 2016-11-29 MED FILL — FERROUS SULFATE 325 MG TAB: 325 (65 FE) | 30 days supply | Qty: 60 | Fill #0

## 2016-11-29 MED FILL — VIT D2 1.25 MG (50,000 UNIT: 1.25 MG | 56 days supply | Qty: 8 | Fill #0

## 2016-11-29 NOTE — Telephone Encounter (Signed)
CMA call patient to go over lab results  Patient Verify DOB  Patient was aware and understood   

## 2016-11-29 NOTE — Telephone Encounter (Signed)
-----   Message from Dessa Phi, MD sent at 11/28/2016  5:58 PM EDT ----- Vit D deficiency and slight anemia Start iron therapy for anemia Vit D supplement ordered

## 2017-01-08 ENCOUNTER — Encounter: Payer: Self-pay | Admitting: Family Medicine

## 2017-02-21 ENCOUNTER — Ambulatory Visit: Payer: Self-pay | Attending: Family Medicine

## 2017-02-25 ENCOUNTER — Ambulatory Visit: Payer: Self-pay | Attending: Family Medicine | Admitting: Family Medicine

## 2017-02-25 ENCOUNTER — Encounter: Payer: Self-pay | Admitting: Family Medicine

## 2017-02-25 VITALS — BP 116/76 | HR 82 | Temp 98.4°F | Ht 60.0 in | Wt 142.0 lb

## 2017-02-25 DIAGNOSIS — R110 Nausea: Secondary | ICD-10-CM | POA: Insufficient documentation

## 2017-02-25 DIAGNOSIS — H578 Other specified disorders of eye and adnexa: Secondary | ICD-10-CM | POA: Insufficient documentation

## 2017-02-25 DIAGNOSIS — Z833 Family history of diabetes mellitus: Secondary | ICD-10-CM | POA: Insufficient documentation

## 2017-02-25 DIAGNOSIS — G44209 Tension-type headache, unspecified, not intractable: Secondary | ICD-10-CM | POA: Insufficient documentation

## 2017-02-25 DIAGNOSIS — Z23 Encounter for immunization: Secondary | ICD-10-CM

## 2017-02-25 DIAGNOSIS — Z79899 Other long term (current) drug therapy: Secondary | ICD-10-CM | POA: Insufficient documentation

## 2017-02-25 MED ORDER — SUMATRIPTAN SUCCINATE 25 MG PO TABS: 25.0000 mg | ORAL_TABLET | ORAL | 2 refills | Status: DC | PRN

## 2017-02-25 MED ORDER — PROPRANOLOL HCL 40 MG PO TABS: 40.0000 mg | ORAL_TABLET | Freq: Two times a day (BID) | ORAL | 5 refills | Status: DC

## 2017-02-25 MED FILL — ?PROPRANOLOL 40 MG TABLET: 40 | 30 days supply | Qty: 60 | Fill #0

## 2017-02-25 NOTE — Patient Instructions (Addendum)
Cheryl HesselbachMaria was seen today for headache.  Diagnoses and all orders for this visit:  Tension headache -     Ambulatory referral to Optometry -     propranolol (INDERAL) 40 MG tablet; Take 1 tablet (40 mg total) by mouth 2 (two) times daily. -     SUMAtriptan (IMITREX) 25 MG tablet; Take 1 tablet (25 mg total) by mouth every 2 (two) hours as needed for migraine. May repeat in 2 hours if headache persists or recurs. Do not exceed 2 doses in 7 day period  Family history of diabetes mellitus in father   F/u in 3 months for headaches, return sooner if needed   Dr. Armen PickupFunches

## 2017-02-25 NOTE — Progress Notes (Signed)
Subjective:  Patient ID: [name], female    DOB: 09/01/1975  Age: 41 y.o. MRN: [id number]  CC: Headache   HPI Cheryl DingwallMaria Gloria Espinoza presents for  Spanish interpreter Alejandra  ID # [id number]  1. Headache: off an on for 12 year. She hit the R upper side of her head 12 years ago and still has intermittent pain in head with tenderness and sensitivity on scalp on the R side.  She reports headaches occur 2 times per week. Pain last for 1 hr. There is associated eye irritation. There is nausea. No vomiting. She denies aura. Pain level is 4/10.    Social History  Substance Use Topics  . Smoking status: Never Smoker  . Smokeless tobacco: Never Used  . Alcohol use No    Outpatient Medications Prior to Visit  Medication Sig Dispense Refill  . cetirizine (ZYRTEC) 10 MG tablet Take 1 tablet (10 mg total) by mouth daily. (Patient not taking: Reported on 11/26/2016) 30 tablet 11  . ferrous sulfate 325 (65 FE) MG tablet Take 1 tablet (325 mg total) by mouth 2 (two) times daily with a meal. 60 tablet 3  . omeprazole (PRILOSEC) 20 MG capsule Take 1 capsule (20 mg total) by mouth daily. 30 capsule 3  . propranolol (INDERAL) 40 MG tablet Take 1 tablet (40 mg total) by mouth 2 (two) times daily. 60 tablet 5  . SUMAtriptan (IMITREX) 25 MG tablet Take 1 tablet (25 mg total) by mouth every 2 (two) hours as needed for migraine. May repeat in 2 hours if headache persists or recurs. Do not exceed 2 doses in 7 day period 10 tablet 2  . Vitamin D, Ergocalciferol, (DRISDOL) 50000 units CAPS capsule Take 1 capsule (50,000 Units total) by mouth every 7 (seven) days. 8 capsule 0   No facility-administered medications prior to visit.     ROS Review of Systems  Constitutional: Negative for chills and fever.  HENT: Negative for ear pain and hearing loss.   Eyes: Positive for photophobia and visual disturbance.  Respiratory: Negative for shortness of breath.   Cardiovascular: Negative  for chest pain.  Gastrointestinal: Negative for abdominal pain and blood in stool.  Genitourinary: Negative for vaginal discharge.  Musculoskeletal: Negative for arthralgias and back pain.  Skin: Negative for rash.  Allergic/Immunologic: Negative for immunocompromised state.  Neurological: Positive for headaches. Negative for dizziness.  Hematological: Negative for adenopathy. Does not bruise/bleed easily.  Psychiatric/Behavioral: Positive for dysphoric mood (her father died in [place] one month ago due to complications of diabetes ). Negative for sleep disturbance and suicidal ideas. The patient is not nervous/anxious.     Objective:  BP 116/76   Pulse 82   Temp 98.4 F (36.9 C) (Oral)   Ht 5' (1.524 m)   Wt 142 lb (64.4 kg)   SpO2 97%   BMI 27.73 kg/m   BP/Weight 02/25/2017 11/26/2016 08/06/2016  Systolic BP 116 113 116  Diastolic BP 76 75 80  Wt. (Lbs) 142 145 150.6  BMI 27.73 28.32 29.41   Physical Exam  Constitutional: She is oriented to person, place, and time. She appears well-developed and well-nourished. No distress.  HENT:  Head: Normocephalic and atraumatic.  Right Ear: Tympanic membrane, external ear and ear canal normal.  Left Ear: Tympanic membrane, external ear and ear canal normal.  Eyes: Conjunctivae and EOM are normal. Pupils are equal, round, and reactive to light.  Cardiovascular: Normal rate, regular rhythm, normal heart sounds and intact distal pulses.  Pulmonary/Chest: Effort normal and breath sounds normal.  Genitourinary: Pelvic exam was performed with patient prone. Cervix exhibits no motion tenderness, no discharge and no friability.  Musculoskeletal: She exhibits no edema.  Neurological: She is alert and oriented to person, place, and time.  Skin: Skin is warm and dry. No rash noted.  Psychiatric: She has a normal mood and affect.   Lab Results  Component Value Date   HGBA1C 5.7 11/26/2016     Assessment & Plan:  Markiesha was seen today for  headache.  Diagnoses and all orders for this visit:  Tension headache -     Ambulatory referral to Optometry -     propranolol (INDERAL) 40 MG tablet; Take 1 tablet (40 mg total) by mouth 2 (two) times daily. -     Discontinue: SUMAtriptan (IMITREX) 25 MG tablet; Take 1 tablet (25 mg total) by mouth every 2 (two) hours as needed for migraine. May repeat in 2 hours if headache persists or recurs. Do not exceed 2 doses in 7 day period -     SUMAtriptan (IMITREX) 25 MG tablet; Take 1 tablet (25 mg total) by mouth every 2 (two) hours as needed for migraine. May repeat in 2 hours if headache persists or recurs. Do not exceed 2 doses in 7 day period  Family history of diabetes mellitus in father  Other orders -     Tdap vaccine greater than or equal to 7yo IM   There are no diagnoses linked to this encounter.  No orders of the defined types were placed in this encounter.   Follow-up: Return in about 3 months (around 05/28/2017) for headache.   Dessa Phi MD

## 2017-02-26 NOTE — Assessment & Plan Note (Addendum)
Tension headache Improved with inderal and imitrex Continue current treatment

## 2017-02-26 NOTE — Assessment & Plan Note (Signed)
Normal A1c 3 months ago Will trend yearly

## 2017-05-26 ENCOUNTER — Ambulatory Visit: Payer: Self-pay | Attending: Family Medicine | Admitting: Family Medicine

## 2017-05-26 ENCOUNTER — Other Ambulatory Visit: Payer: Self-pay

## 2017-05-26 ENCOUNTER — Encounter: Payer: Self-pay | Admitting: Family Medicine

## 2017-05-26 VITALS — BP 105/68 | HR 76 | Temp 98.5°F | Resp 18 | Ht 60.0 in | Wt 143.8 lb

## 2017-05-26 DIAGNOSIS — D649 Anemia, unspecified: Secondary | ICD-10-CM | POA: Insufficient documentation

## 2017-05-26 DIAGNOSIS — N920 Excessive and frequent menstruation with regular cycle: Secondary | ICD-10-CM | POA: Insufficient documentation

## 2017-05-26 DIAGNOSIS — K219 Gastro-esophageal reflux disease without esophagitis: Secondary | ICD-10-CM

## 2017-05-26 DIAGNOSIS — R0789 Other chest pain: Secondary | ICD-10-CM | POA: Insufficient documentation

## 2017-05-26 DIAGNOSIS — Z8742 Personal history of other diseases of the female genital tract: Secondary | ICD-10-CM

## 2017-05-26 DIAGNOSIS — Z09 Encounter for follow-up examination after completed treatment for conditions other than malignant neoplasm: Secondary | ICD-10-CM

## 2017-05-26 DIAGNOSIS — Z23 Encounter for immunization: Secondary | ICD-10-CM | POA: Insufficient documentation

## 2017-05-26 DIAGNOSIS — R079 Chest pain, unspecified: Secondary | ICD-10-CM

## 2017-05-26 DIAGNOSIS — Z8639 Personal history of other endocrine, nutritional and metabolic disease: Secondary | ICD-10-CM

## 2017-05-26 DIAGNOSIS — R0602 Shortness of breath: Secondary | ICD-10-CM | POA: Insufficient documentation

## 2017-05-26 DIAGNOSIS — R600 Localized edema: Secondary | ICD-10-CM | POA: Insufficient documentation

## 2017-05-26 DIAGNOSIS — Z862 Personal history of diseases of the blood and blood-forming organs and certain disorders involving the immune mechanism: Secondary | ICD-10-CM

## 2017-05-26 DIAGNOSIS — G44209 Tension-type headache, unspecified, not intractable: Secondary | ICD-10-CM | POA: Insufficient documentation

## 2017-05-26 MED ORDER — PROPRANOLOL HCL 40 MG PO TABS: 40.0000 mg | ORAL_TABLET | Freq: Two times a day (BID) | ORAL | 5 refills | Status: DC

## 2017-05-26 MED ORDER — OMEPRAZOLE 20 MG PO CPDR: 20.0000 mg | DELAYED_RELEASE_CAPSULE | Freq: Every day | ORAL | 3 refills | Status: DC

## 2017-05-26 MED ORDER — SUMATRIPTAN SUCCINATE 25 MG PO TABS: 25.0000 mg | ORAL_TABLET | ORAL | 2 refills | Status: DC | PRN

## 2017-05-26 MED FILL — SUMATRIPTAN SUCC 25 MG TAB: 25 | 30 days supply | Qty: 10 | Fill #0

## 2017-05-26 MED FILL — ?PROPRANOLOL 40 MG TABLET: 40 | 30 days supply | Qty: 60 | Fill #0

## 2017-05-26 MED FILL — ?OMEPRAZOLE DR 20 MG CAPSUL: 20 | 30 days supply | Qty: 30 | Fill #0

## 2017-05-26 NOTE — Progress Notes (Signed)
Subjective:  Patient ID: Cheryl Espinoza, female    DOB: 29-Nov-1975  Age: 41 y.o. MRN: [id number]  CC: Headache   HPI Cheryl Espinoza presents for follow up for headaches. Symptoms began about 12 years ago. Generally, the headaches last about an hour and occur twice a week. The headaches are usually mild to moderate and are right-sided unilateral in location.  The patient rates her most severe headaches a 4 on a scale from 1 to 10. Recently, the headaches are decreasing in both severity and frequency. She reports being without headaches for 1 month. The patient denies muscle weakness, vision problems and vomiting. Treatment has included Imitrex oral and propanolol with good improvement of symptoms. She complains of chest pain. Onset was 2 months ago. She reports CP re-occurs about every two weeks. She reports last episode was 1 week ago. The patient describes the pain as intermittent, pressure like in nature, does not radiate. Patient rates pain as a 4/10 in intensity.  Associated symptoms are chest pressure/discomfort, lower extremity edema and SOB. Aggravating factors are none.  Alleviating factors are: none. Patient's cardiac risk factors are sedentary lifestyle.   Previous cardiac testing: none. History of anemia.  Anemia was found by routine CBC.  It has been present for several months.  Associated signs & symptoms: menorrhagia. Cycles 5 to 6 days.    Outpatient Medications Prior to Visit  Medication Sig Dispense Refill  . cetirizine (ZYRTEC) 10 MG tablet Take 1 tablet (10 mg total) by mouth daily. 30 tablet 11  . ferrous sulfate 325 (65 FE) MG tablet Take 1 tablet (325 mg total) by mouth 2 (two) times daily with a meal. 60 tablet 3  . Vitamin D, Ergocalciferol, (DRISDOL) 50000 units CAPS capsule Take 1 capsule (50,000 Units total) by mouth every 7 (seven) days. 8 capsule 0  . omeprazole (PRILOSEC) 20 MG capsule Take 1 capsule (20 mg total) by mouth daily. 30 capsule  3  . propranolol (INDERAL) 40 MG tablet Take 1 tablet (40 mg total) by mouth 2 (two) times daily. 60 tablet 5  . SUMAtriptan (IMITREX) 25 MG tablet Take 1 tablet (25 mg total) by mouth every 2 (two) hours as needed for migraine. May repeat in 2 hours if headache persists or recurs. Do not exceed 2 doses in 7 day period 10 tablet 2   No facility-administered medications prior to visit.     ROS Review of Systems  Constitutional: Negative.   HENT: Negative.   Respiratory: Negative.   Cardiovascular: Positive for chest pain.  Gastrointestinal: Negative.   Neurological: Positive for headaches.  Psychiatric/Behavioral: Negative.    Objective:  BP 105/68 (BP Location: Left Arm, Patient Position: Sitting, Cuff Size: Normal)   Pulse 76   Temp 98.5 F (36.9 C) (Oral)   Resp 18   Ht 5' (1.524 m)   Wt 143 lb 12.8 oz (65.2 kg)   SpO2 99%   BMI 28.08 kg/m   BP/Weight 05/26/2017 02/25/2017 11/26/2016  Systolic BP 105 116 113  Diastolic BP 68 76 75  Wt. (Lbs) 143.8 142 145  BMI 28.08 27.73 28.32     Physical Exam  Constitutional: She appears well-developed and well-nourished.  HENT:  Head: Normocephalic and atraumatic.  Right Ear: External ear normal.  Left Ear: External ear normal.  Nose: Nose normal.  Mouth/Throat: Oropharynx is clear and moist.  Eyes: Pupils are equal, round, and reactive to light. Conjunctivae are normal.  Neck: Normal range of motion. Neck supple. No  JVD present.  Cardiovascular: Normal rate, regular rhythm, normal heart sounds and intact distal pulses.   Pulmonary/Chest: Effort normal and breath sounds normal.  Abdominal: Soft. Bowel sounds are normal. There is no tenderness.  Lymphadenopathy:    She has no cervical adenopathy.  Skin: Skin is warm and dry.  Psychiatric: She expresses no homicidal and no suicidal ideation. She expresses no suicidal plans and no homicidal plans.  Nursing note and vitals reviewed.    Assessment & Plan:   1. Follow up   2.  History of anemia  - CBC  3. History of menorrhagia  - CBC  4. Tension headache  - propranolol (INDERAL) 40 MG tablet; Take 1 tablet (40 mg total) by mouth 2 (two) times daily.  Dispense: 60 tablet; Refill: 5 - SUMAtriptan (IMITREX) 25 MG tablet; Take 1 tablet (25 mg total) by mouth every 2 (two) hours as needed for migraine. May repeat in 2 hours if headache persists or recurs. Do not exceed 2 doses in 7 day period  Dispense: 10 tablet; Refill: 2  5. Chest pain, unspecified type  - EKG 12-Lead - CBC - TSH - Basic Metabolic Panel  6. Needs flu shot  - Flu Vaccine QUAD 6+ mos PF IM (Fluarix Quad PF)  7. History of vitamin D deficiency  - Vitamin D, 25-hydroxy  8. Gastroesophageal reflux disease, esophagitis presence not specified  - omeprazole (PRILOSEC) 20 MG capsule; Take 1 capsule (20 mg total) by mouth daily.  Dispense: 30 capsule; Refill: 3   Meds ordered this encounter  Medications  . propranolol (INDERAL) 40 MG tablet    Sig: Take 1 tablet (40 mg total) by mouth 2 (two) times daily.    Dispense:  60 tablet    Refill:  5    Order Specific Question:   Supervising Provider    Answer:   Quentin Angst [id number]  . SUMAtriptan (IMITREX) 25 MG tablet    Sig: Take 1 tablet (25 mg total) by mouth every 2 (two) hours as needed for migraine. May repeat in 2 hours if headache persists or recurs. Do not exceed 2 doses in 7 day period    Dispense:  10 tablet    Refill:  2    Order Specific Question:   Supervising Provider    Answer:   Quentin Angst [id number]  . omeprazole (PRILOSEC) 20 MG capsule    Sig: Take 1 capsule (20 mg total) by mouth daily.    Dispense:  30 capsule    Refill:  3    Order Specific Question:   Supervising Provider    Answer:   Quentin Angst [id number]    Follow-up: Return in about 1 month (around 06/26/2017) for PAP.   [name] FNP

## 2017-05-26 NOTE — Patient Instructions (Signed)
Tension Headache A tension headache is pain, pressure, or aching that is felt over the front and sides of your head. These headaches can last from 30 minutes to several days. Follow these instructions at home: Managing pain  Take over-the-counter and prescription medicines only as told by your doctor.  Lie down in a dark, quiet room when you have a headache.  If directed, apply ice to your head and neck area: ? Put ice in a plastic bag. ? Place a towel between your skin and the bag. ? Leave the ice on for 20 minutes, 2-3 times per day.  Use a heating pad or a hot shower to apply heat to your head and neck area as told by your doctor. Eating and drinking  Eat meals on a regular schedule.  Do not drink a lot of alcohol.  Do not use a lot of caffeine, or stop using caffeine. General instructions  Keep all follow-up visits as told by your doctor. This is important.  Keep a journal to find out if certain things bring on headaches. For example, write down: ? What you eat and drink. ? How much sleep you get. ? Any change to your diet or medicines.  Try getting a massage, or doing other things that help you to relax.  Lessen stress.  Sit up straight. Do not tighten (tense) your muscles.  Do not use tobacco products. This includes cigarettes, chewing tobacco, or e-cigarettes. If you need help quitting, ask your doctor.  Exercise regularly as told by your doctor.  Get enough sleep. This may mean 7-9 hours of sleep. Contact a doctor if:  Your symptoms are not helped by medicine.  You have a headache that feels different from your usual headache.  You feel sick to your stomach (nauseous) or you throw up (vomit).  You have a fever. Get help right away if:  Your headache becomes very bad.  You keep throwing up.  You have a stiff neck.  You have trouble seeing.  You have trouble speaking.  You have pain in your eye or ear.  Your muscles are weak or you lose muscle  control.  You lose your balance or you have trouble walking.  You feel like you will pass out (faint) or you pass out.  You have confusion. This information is not intended to replace advice given to you by your health care provider. Make sure you discuss any questions you have with your health care provider. Document Released: 11/06/2009 Document Revised: 04/11/2016 Document Reviewed: 12/05/2014 Elsevier Interactive Patient Education  2018 Elsevier Inc.  

## 2017-05-27 LAB — BASIC METABOLIC PANEL
BUN/Creatinine Ratio: 16 (ref 9–23)
BUN: 9 mg/dL (ref 6–20)
CALCIUM: 9.9 mg/dL (ref 8.7–10.2)
CHLORIDE: 104 mmol/L (ref 96–106)
CO2: 18 mmol/L — AB (ref 20–29)
Creatinine, Ser: 0.55 mg/dL — ABNORMAL LOW (ref 0.57–1.00)
GFR calc Af Amer: 137 mL/min/{1.73_m2} (ref >59)
GFR calc non Af Amer: 119 mL/min/{1.73_m2} (ref >59)
GLUCOSE: 113 mg/dL — AB (ref 65–99)
POTASSIUM: 4.6 mmol/L (ref 3.5–5.2)
Sodium: 139 mmol/L (ref 134–144)

## 2017-05-27 LAB — VITAMIN D 25 HYDROXY (VIT D DEFICIENCY, FRACTURES): VIT D 25 HYDROXY: 23.3 ng/mL — AB (ref 30.0–100.0)

## 2017-05-27 LAB — CBC
HEMATOCRIT: 35 % (ref 34.0–46.6)
HEMOGLOBIN: 10.8 g/dL — AB (ref 11.1–15.9)
MCH: 27.6 pg (ref 26.6–33.0)
MCHC: 30.9 g/dL — AB (ref 31.5–35.7)
MCV: 90 fL (ref 79–97)
PLATELETS: 390 10*3/uL — AB (ref 150–379)
RBC: 3.91 x10E6/uL (ref 3.77–5.28)
RDW: 16.1 % — AB (ref 12.3–15.4)
WBC: 10.4 10*3/uL (ref 3.4–10.8)

## 2017-05-27 LAB — TSH: TSH: 0.565 u[IU]/mL (ref 0.450–4.500)

## 2017-06-02 ENCOUNTER — Telehealth: Payer: Self-pay

## 2017-06-02 ENCOUNTER — Other Ambulatory Visit: Payer: Self-pay | Admitting: Family Medicine

## 2017-06-02 DIAGNOSIS — E559 Vitamin D deficiency, unspecified: Secondary | ICD-10-CM

## 2017-06-02 DIAGNOSIS — N920 Excessive and frequent menstruation with regular cycle: Secondary | ICD-10-CM

## 2017-06-02 DIAGNOSIS — D649 Anemia, unspecified: Secondary | ICD-10-CM

## 2017-06-02 MED ORDER — VITAMIN D (ERGOCALCIFEROL) 1.25 MG (50000 UNIT) PO CAPS: ORAL_CAPSULE | ORAL | 0 refills | Status: DC

## 2017-06-02 MED ORDER — FERROUS SULFATE 325 (65 FE) MG PO TABS: 325.0000 mg | ORAL_TABLET | Freq: Two times a day (BID) | ORAL | 2 refills | Status: DC

## 2017-06-02 NOTE — Telephone Encounter (Signed)
-----   Message from [name], FNP sent at 06/02/2017  4:55 PM EDT ----- Vitamin D level was low. Vitamin D helps to keep bones strong. You were prescribed ergocalciferol (capsules) to increase your vitamin-d level. Once finished start taking OTC vitamin d supplement with 800 international units (IU) of vitamin-d per day.  Labs that evaluated your blood cells shows signs of chronic anemia. No acute infection, or inflammation present. Recommend referral for pelvic ultrasound imaging to evaluate cause.  Thyroid function normal. When thyroid is over functioning it cause cardiovascular symptoms. Labs normal. Recommend follow up in 3 months.

## 2017-06-02 NOTE — Telephone Encounter (Signed)
CMA call regarding lab results   Patient did not answer but left a VM stating the reason of the call &  to call me back  

## 2017-06-17 ENCOUNTER — Ambulatory Visit (HOSPITAL_COMMUNITY)
Admission: RE | Admit: 2017-06-17 | Discharge: 2017-06-17 | Disposition: A | Discharge status: Home / Self Care | Payer: Self-pay | Source: Ambulatory Visit | Attending: Family Medicine | Admitting: Family Medicine

## 2017-06-17 DIAGNOSIS — R1904 Left lower quadrant abdominal swelling, mass and lump: Secondary | ICD-10-CM | POA: Insufficient documentation

## 2017-06-17 DIAGNOSIS — D649 Anemia, unspecified: Secondary | ICD-10-CM | POA: Insufficient documentation

## 2017-06-17 DIAGNOSIS — N920 Excessive and frequent menstruation with regular cycle: Secondary | ICD-10-CM | POA: Insufficient documentation

## 2017-06-20 ENCOUNTER — Ambulatory Visit: Payer: Self-pay | Attending: Family Medicine

## 2017-06-24 ENCOUNTER — Other Ambulatory Visit: Payer: Self-pay | Admitting: Family Medicine

## 2017-06-24 DIAGNOSIS — N920 Excessive and frequent menstruation with regular cycle: Secondary | ICD-10-CM

## 2017-06-24 DIAGNOSIS — Q504 Embryonic cyst of fallopian tube: Secondary | ICD-10-CM

## 2017-06-25 ENCOUNTER — Telehealth: Payer: Self-pay

## 2017-06-25 NOTE — Telephone Encounter (Signed)
-----   Message from Lizbeth BarkMandesia R Hairston, FNP sent at 06/24/2017  8:49 PM EDT ----- Small left sided parovarian cyst 2.2 cm in diameter.  No uterine or ovarian mass. You will be referred to gynecology.

## 2017-06-25 NOTE — Telephone Encounter (Signed)
CMA call regarding lab results   Patient verify DOB  Patient was aware and understood  

## 2017-07-09 ENCOUNTER — Other Ambulatory Visit: Payer: Self-pay | Admitting: Family Medicine

## 2017-07-28 ENCOUNTER — Other Ambulatory Visit: Payer: Self-pay | Admitting: Family Medicine

## 2017-08-15 ENCOUNTER — Encounter: Payer: Self-pay | Admitting: Obstetrics and Gynecology

## 2017-08-15 ENCOUNTER — Encounter: Payer: Self-pay | Admitting: General Practice

## 2017-08-29 ENCOUNTER — Encounter: Payer: Self-pay | Admitting: Family Medicine

## 2017-08-29 ENCOUNTER — Other Ambulatory Visit: Payer: Self-pay | Admitting: Family Medicine

## 2017-08-29 ENCOUNTER — Ambulatory Visit: Payer: Self-pay | Attending: Family Medicine | Admitting: Family Medicine

## 2017-08-29 VITALS — BP 108/68 | HR 69 | Temp 98.7°F | Resp 18 | Ht 60.0 in | Wt 144.0 lb

## 2017-08-29 DIAGNOSIS — Z862 Personal history of diseases of the blood and blood-forming organs and certain disorders involving the immune mechanism: Secondary | ICD-10-CM

## 2017-08-29 DIAGNOSIS — N898 Other specified noninflammatory disorders of vagina: Secondary | ICD-10-CM

## 2017-08-29 DIAGNOSIS — Z01419 Encounter for gynecological examination (general) (routine) without abnormal findings: Secondary | ICD-10-CM

## 2017-08-29 DIAGNOSIS — B9689 Other specified bacterial agents as the cause of diseases classified elsewhere: Secondary | ICD-10-CM | POA: Insufficient documentation

## 2017-08-29 DIAGNOSIS — N63 Unspecified lump in unspecified breast: Secondary | ICD-10-CM

## 2017-08-29 DIAGNOSIS — N870 Mild cervical dysplasia: Secondary | ICD-10-CM | POA: Insufficient documentation

## 2017-08-29 DIAGNOSIS — N6320 Unspecified lump in the left breast, unspecified quadrant: Secondary | ICD-10-CM | POA: Insufficient documentation

## 2017-08-29 DIAGNOSIS — Z01411 Encounter for gynecological examination (general) (routine) with abnormal findings: Secondary | ICD-10-CM | POA: Insufficient documentation

## 2017-08-29 DIAGNOSIS — N760 Acute vaginitis: Secondary | ICD-10-CM | POA: Insufficient documentation

## 2017-08-29 DIAGNOSIS — Z113 Encounter for screening for infections with a predominantly sexual mode of transmission: Secondary | ICD-10-CM | POA: Insufficient documentation

## 2017-08-29 MED ORDER — FLUCONAZOLE 150 MG PO TABS: 150.0000 mg | ORAL_TABLET | Freq: Once | ORAL | 0 refills | Status: AC

## 2017-08-29 NOTE — Patient Instructions (Signed)
Autoexamen de mamas (Breast Self-Awareness) El autoexamen de mamas significa lo siguiente:  Saber cul es el aspecto de las mamas.  Saber cmo se las siente al tacto.  Controlarse las mamas mensualmente para detectar cambios.  Informar el mdico si advierte un cambio en las mamas. El autoexamen de mama le permite advertir problemas en la mama con anticipacin, cuando todava son pequeos. CMO REALIZAR EL AUTOEXAMEN DE MAMAS Una forma de aprender qu es normal para las mamas y revisar si hay cambios es hacerse un autoexamen de las mamas. Para hacer un autoexamen de las mamas: Busque cambios 1. Qutese toda la ropa por encima de la cintura. 2. Prese frente a un espejo en una habitacin con buena iluminacin. 3. Apoye las manos en las caderas. 4. Empuje hacia abajo con las manos. 5. Mrese las mamas y los pezones en el espejo, para ver si hay diferencias entre s. Durante el examen, intente determinar si:  La forma de una mama es diferente.  El tamao de una mama es diferente.  Hay arrugas, depresiones y protuberancias en una mama y no en la otra. 1. Observe cada mama para buscar cambios en la piel, por ejemplo:  Enrojecimiento.  Zonas escamosas. 1. Observe si hay cambios en los pezones, por ejemplo:  Lquido alrededor de los pezones.  Hemorragia.  Hoyuelos.  Enrojecimiento.  Un cambio en el lugar de los pezones. Plpese para detectar si hay cambios 1. Acustese en el piso boca arriba. 2. Plpese cada mama. Para hacerlo, siga estos pasos:  Elija una mama para palpar.  Coloque el brazo ms cercano a esa mama por encima de la cabeza.  Use el otro brazo para palpar la zona del pezn de la mama. Plpese la zona con las yemas de los tres dedos del medio y haga crculos con los dedos. Con el primer crculo, presione suavemente. Con el segundo, ms fuerte. Con el tercero, an ms fuerte.  Siga haciendo crculos con los dedos con la presin suave, fuerte e incluso ms  fuerte a medida que desciende por la mama. Detngase cuando sienta las costillas.  Desplace los dedos un poco hacia el centro del cuerpo.  Empiece a hacer crculos con los dedos nuevamente y esta vez haga movimientos ascendentes hasta llegar a la clavcula.  Siga haciendo crculos hacia arriba y hacia abajo hasta llegar a la axila. Recuerde hacerlos con las tres presiones.  Plpese la otra mama de la misma forma. 1. Sintese o prese en la ducha o la baera. 2. Con agua jabonosa en la piel, plpese cada mama del mismo modo que lo hizo en el paso2, mientras estaba acostada en el piso. Anote sus hallazgos Despus del autoexamen, anote lo siguiente:  Qu es normal para cada mama.  Cualquier cambio que haya encontrado en cada mama.  Cundo tuvo la ltima menstruacin. CON QU FRECUENCIA DEBO EXAMINARME LAS MAMAS? Examnese las mamas todos los meses. Si est amamantando, el mejor momento para el examen es despus de darle de mamar al beb o despus de usar un sacaleches. Si menstra, el mejor momento para hacerlo es 5 a 7das despus de la finalizada la menstruacin. CUNDO DEBO VISITAR AL MDICO? Visite al mdico si advierte lo siguiente:  Un cambio en la forma o el tamao de las mamas o los pezones.  Un cambio en la piel de las mamas o los pezones, como la piel enrojecida o escamosa.  Secrecin de un lquido fuera de lo comn de los pezones.  Un ndulo o una   zona engrosada que no tena antes.  Dolor de mamas.  Cualquier cosa que la preocupe. Esta informacin no tiene como fin reemplazar el consejo del mdico. Asegrese de hacerle al mdico cualquier pregunta que tenga. Document Released: 09/14/2010 Document Revised: 12/04/2015 Document Reviewed: 07/02/2015 Elsevier Interactive Patient Education  2018 Elsevier Inc.  Prueba de Papanicolaou (Pap Test) POR QU ME DEBO REALIZAR ESTA PRUEBA? A esta prueba tambin se la denomina "frotis de Pap". Es una prueba de deteccin que se  utiliza para detectar signos de cncer de vagina, cuello del tero y tero. La prueba tambin puede identificar la presencia de infeccin o cambios precancerosos. El mdico probablemente le recomiende que se realice esta prueba en forma regular. Esta prueba puede realizarse de la siguiente manera:  Cada 3 aos, a partir de los 21 aos.  Cada 5 aos, en combinacin con las pruebas que se realizan para detectar la presencia del virus del papiloma humano (VPH).  Con mayor o menor frecuencia, en funcin de otras enfermedades que tenga. QU TIPO DE MUESTRA SE TOMA? El mdico utilizar un pequeo hisopo de algodn, una esptula de plstico o un cepillo para recolectar una muestra de clulas de la superficie del cuello del tero. El cuello del tero es la apertura del tero, que tambin se conoce como matriz. Tambin pueden recolectarse las secreciones del cuello del tero y la vagina. CMO DEBO PREPARARME PARA ESTA PRUEBA?  Tenga en cuenta en qu etapa del ciclo menstrual se encuentra. Es posible que deba reprogramar la prueba si est menstruando el da en que debe realizrsela.  Si el da en que debe realizarse la prueba tiene una infeccin vaginal aparente, deber reprogramar la prueba.  Pueden pedirle que evite tomar una ducha o bao el da de la prueba o el da anterior.  Algunos medicamentos pueden provocar resultados anormales de la prueba, como los digitlicos y la tetraciclina. Si toma alguno de estos medicamentos, hable con su mdico antes de realizarse la prueba. QU SIGNIFICAN LOS RESULTADOS? Los resultados anormales de la prueba pueden indicar diversas enfermedades. Estas pueden incluir lo siguiente:  Cncer. Si bien los resultados de la prueba de Papanicolaou no pueden utilizarse para diagnosticar cncer de cuello del tero, de vagina o de tero, pueden indicar que existe una posibilidad de presencia de cncer. En este caso, ser necesario realizar pruebas adicionales para determinar  la presencia de cncer.  Enfermedad de transmisin sexual.  Infecciones por hongos.  Infeccin por parsitos.  Infeccin por herpes.  Una enfermedad que causa o favorece la infertilidad. Es su responsabilidad retirar el resultado del estudio. Consulte en el laboratorio o en el departamento en el que fue realizado el estudio cundo y cmo podr obtener los resultados. Comunquese con el mdico si tiene preguntas sobre los resultados. Esta informacin no tiene como fin reemplazar el consejo del mdico. Asegrese de hacerle al mdico cualquier pregunta que tenga. Document Released: 01/29/2008 Document Revised: 09/02/2014 Document Reviewed: 01/03/2014 Elsevier Interactive Patient Education  2018 Elsevier Inc.  

## 2017-08-29 NOTE — Progress Notes (Signed)
Subjective:  Patient ID: Cheryl Espinoza, female    DOB: 05/19/1976  Age: 42 y.o. MRN: [id number]  CC: Gynecologic Exam   HPI Cheryl Espinoza presents for well woman visit with pap. Interpreter services used [employer] # [id number].  She denies family history of breast cancer. She denies family history of  gynecological cancers. She is not a current smoker.  She denies any lumps, denting or dimpling of the breast, or nipple discharge. She does not perform monthly SBE. She denies  vaginal lesions, discharge, or dysuria. She is agreeable to STI testing with pap. She reports one sexual partner within the last 3 months.    Outpatient Medications Prior to Visit  Medication Sig Dispense Refill  . cetirizine (ZYRTEC) 10 MG tablet Take 1 tablet (10 mg total) by mouth daily. 30 tablet 11  . ferrous sulfate 325 (65 FE) MG tablet Take 1 tablet (325 mg total) by mouth 2 (two) times daily with a meal. 60 tablet 2  . omeprazole (PRILOSEC) 20 MG capsule Take 1 capsule (20 mg total) by mouth daily. 30 capsule 3  . propranolol (INDERAL) 40 MG tablet Take 1 tablet (40 mg total) by mouth 2 (two) times daily. 60 tablet 5  . SUMAtriptan (IMITREX) 25 MG tablet Take 1 tablet (25 mg total) by mouth every 2 (two) hours as needed for migraine. May repeat in 2 hours if headache persists or recurs. Do not exceed 2 doses in 7 day period 10 tablet 2  . Vitamin D, Ergocalciferol, (DRISDOL) 50000 units CAPS capsule TAKE ONE TABLET ONCE A WEEK BY MOUTH FOR 12 WEEKS. 12 capsule 0   No facility-administered medications prior to visit.     ROS Review of Systems  Constitutional: Negative.   Respiratory: Negative.   Cardiovascular: Negative.   Gastrointestinal: Negative.   Genitourinary: Negative for dysuria, pelvic pain and vaginal discharge.  Psychiatric/Behavioral: Negative for suicidal ideas.   Objective:  BP 108/68 (BP Location: Left Arm, Patient Position: Sitting, Cuff Size: Normal)   Pulse 69    Temp 98.7 F (37.1 C) (Oral)   Resp 18   Ht 5' (1.524 m)   Wt 144 lb (65.3 kg)   SpO2 100%   BMI 28.12 kg/m   BP/Weight 08/29/2017 05/26/2017 02/25/2017  Systolic BP 108 105 116  Diastolic BP 68 68 76  Wt. (Lbs) 144 143.8 142  BMI 28.12 28.08 27.73    Physical Exam  Constitutional: She appears well-developed and well-nourished.  Cardiovascular: Normal rate, regular rhythm, normal heart sounds and intact distal pulses.  Pulmonary/Chest: Effort normal and breath sounds normal. Right breast exhibits mass (nodular breasts). Right breast exhibits no nipple discharge, no skin change and no tenderness. Left breast exhibits mass (nodular breasts). Left breast exhibits no nipple discharge, no skin change and no tenderness.  Abdominal: Soft. Bowel sounds are normal. There is no tenderness.  Genitourinary: Cervix exhibits discharge (white). No vaginal discharge found.  Skin: Skin is warm and dry.  Nursing note and vitals reviewed.   Assessment & Plan:   1. Well woman exam with routine gynecological exam  - Cytology - PAP [place] - Cervicovaginal ancillary only  2. Screening for STDs (sexually transmitted diseases)  - HEP, RPR, HIV Panel - HSV(herpes simplex vrs) 1+2 ab-IgG - Cervicovaginal ancillary only  3. Breast lump in female  - US BREAST COMPLETE UNI LEFT INC AXILLA; Future - US BREAST COMPLETE UNI RIGHT INC AXILLA; Future  4. Vaginal discharge  - fluconazole (DIFLUCAN) 150 MG  tablet; Take 1 tablet (150 mg total) by mouth once for 1 dose.  Dispense: 1 tablet; Refill: 0  5. History of anemia  - CBC       Follow-up: Return As needed.   [name] FNP

## 2017-08-30 LAB — CBC
HEMOGLOBIN: 12.1 g/dL (ref 11.1–15.9)
Hematocrit: 35.7 % (ref 34.0–46.6)
MCH: 29.5 pg (ref 26.6–33.0)
MCHC: 33.9 g/dL (ref 31.5–35.7)
MCV: 87 fL (ref 79–97)
PLATELETS: 406 10*3/uL — AB (ref 150–379)
RBC: 4.1 x10E6/uL (ref 3.77–5.28)
RDW: 15.5 % — ABNORMAL HIGH (ref 12.3–15.4)
WBC: 8 10*3/uL (ref 3.4–10.8)

## 2017-08-30 LAB — HSV(HERPES SIMPLEX VRS) I + II AB-IGG: HSV 1 Glycoprotein G Ab, IgG: 21 index — ABNORMAL HIGH (ref 0.00–0.90)

## 2017-08-30 LAB — HEP, RPR, HIV PANEL
HEP B S AG: NEGATIVE
HIV Screen 4th Generation wRfx: NONREACTIVE
RPR: NONREACTIVE

## 2017-09-01 LAB — CERVICOVAGINAL ANCILLARY ONLY
BACTERIAL VAGINITIS: POSITIVE — AB
Candida vaginitis: NEGATIVE
Chlamydia: NEGATIVE
NEISSERIA GONORRHEA: NEGATIVE
TRICH (WINDOWPATH): NEGATIVE

## 2017-09-03 ENCOUNTER — Other Ambulatory Visit: Payer: Self-pay | Admitting: Family Medicine

## 2017-09-03 DIAGNOSIS — R87612 Low grade squamous intraepithelial lesion on cytologic smear of cervix (LGSIL): Secondary | ICD-10-CM

## 2017-09-03 DIAGNOSIS — N760 Acute vaginitis: Secondary | ICD-10-CM

## 2017-09-03 DIAGNOSIS — B9689 Other specified bacterial agents as the cause of diseases classified elsewhere: Secondary | ICD-10-CM

## 2017-09-03 LAB — CYTOLOGY - PAP: HPV: DETECTED — AB

## 2017-09-03 MED ORDER — METRONIDAZOLE 500 MG PO TABS: 500.0000 mg | ORAL_TABLET | Freq: Two times a day (BID) | ORAL | 0 refills | Status: DC

## 2017-09-03 NOTE — Progress Notes (Unsigned)
gyn

## 2017-09-04 LAB — CERVICOVAGINAL ANCILLARY ONLY: Herpes: NEGATIVE

## 2017-09-05 ENCOUNTER — Encounter: Payer: Self-pay | Admitting: Obstetrics and Gynecology

## 2017-09-15 ENCOUNTER — Telehealth: Payer: Self-pay | Admitting: *Deleted

## 2017-09-15 NOTE — Telephone Encounter (Signed)
Medical Assistant used [employer] to contact patient.  Interpreter Name: Salli RealJorge Interpreter #: [phone] Person was advised to inform patient to contact the office back. !!!PAP was abnormal and she was referred gynecology. BV was positive and patient should take flagyl. HIV/HEP C/ Syphilis was negative. All other STI's were negative. 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 No anemia present!!!

## 2017-09-15 NOTE — Telephone Encounter (Signed)
-----   Message from Lizbeth BarkMandesia R Hairston, FNP sent at 09/03/2017  2:31 PM EST ----- Pap show abnormal cells (LSIL) . You will be referred to gynecology. Bacterial vaginosis was positive. BV is caused by an overgrowth of germs in the vagina. You will be prescribed metronidazole.To reduce your risk of developing BV don't douche, don't use scented soap or sprays, and use protection during sexual intercourse. HIV, Hepatitis B, and syphilis are all negative. Genital Herpes, Gonorrhea, Chlamydia,and Trichomonas were all negative. Herpes type 1 which is primarily responsible for cold sores is positive. When you have cold sores do not kiss anyone, share utensils, or have oral sex. Anemia has improved. No anemia present.

## 2017-09-16 ENCOUNTER — Telehealth: Payer: Self-pay | Admitting: Family Medicine

## 2017-10-09 ENCOUNTER — Ambulatory Visit: Payer: Self-pay | Admitting: Obstetrics and Gynecology

## 2017-11-11 ENCOUNTER — Other Ambulatory Visit (HOSPITAL_COMMUNITY): Payer: Self-pay

## 2017-11-11 ENCOUNTER — Encounter (HOSPITAL_COMMUNITY): Payer: Self-pay

## 2017-11-11 ENCOUNTER — Ambulatory Visit (HOSPITAL_COMMUNITY)
Admission: RE | Admit: 2017-11-11 | Discharge: 2017-11-11 | Disposition: A | Discharge status: Home / Self Care | Payer: Self-pay | Source: Ambulatory Visit | Attending: Obstetrics and Gynecology | Admitting: Obstetrics and Gynecology

## 2017-11-11 VITALS — BP 114/66 | Wt 146.0 lb

## 2017-11-11 DIAGNOSIS — Z1239 Encounter for other screening for malignant neoplasm of breast: Secondary | ICD-10-CM

## 2017-11-11 DIAGNOSIS — N6341 Unspecified lump in right breast, subareolar: Secondary | ICD-10-CM

## 2017-11-11 DIAGNOSIS — N6311 Unspecified lump in the right breast, upper outer quadrant: Secondary | ICD-10-CM

## 2017-11-11 DIAGNOSIS — R87612 Low grade squamous intraepithelial lesion on cytologic smear of cervix (LGSIL): Secondary | ICD-10-CM

## 2017-11-11 NOTE — Patient Instructions (Signed)
Explained breast self awareness with [name]. Patient did not need a Pap smear today due to last Pap smear was 08/29/2017. Explained the colposcopy the recommended follow-up for her abnormal Pap smear. [facility] for a colposcopy to follow-up for her abnormal Pap smear. Appointment scheduled for Monday, November 24, 2017 at 1035. [facility] and right breast ultrasound. Appointment scheduled for Monday, November 17, 2017 at 0900. Patient aware of appointmenta and will be there. [name] verbalized understanding.  Aladdin Kollmann, Kathaleen Maserhristine Poll, RN 4:33 PM

## 2017-11-11 NOTE — Progress Notes (Signed)
[facility] due to having an abnormal Pap smear on 08/29/2017 that a colposcopy is recommended for follow-up.  Pap Smear: Pap smear not completed today. [facility] and LSIL with positive HPV. Ref[facility] for a colposcopy to follow-up for her abnormal Pap smear. Appointment scheduled for Monday, November 24, 2017 at 1035. Per patient her most recent Pap smear is the only abnormal Pap smear she has had. Last Pap smear result is in Epic.  Physical exam: Breasts Breasts symmetrical. No skin abnormalities bilateral breasts. No nipple retraction bilateral breasts. No nipple discharge bilateral breasts. No lymphadenopathy. No lumps palpated left breast. Palpated a lump within the right breast at 10 o'clock next to areola. No complaints of pain or tenderness on exam. [facility] a diagnostic mamm[facility] and right breast ultrasound. Appointment scheduled for Monday, November 17, 2017 at 0900.        Pelvic/Bimanual No Pap smear completed today since last Pap smear was 08/29/2017. Pap smear not indicated per [facility] guidelines.   Smoking History: Patient has never smoked.  Patient Navigation: Patient education provided. Access to services provided for patient through [facility]. Spanish interpreter provided.   Breast and Cervical Cancer Risk Assessment: Patient has no family history of breast cancer, known genetic mutations, or radiation treatment to the chest before age 8930. Patient has no history of cervical dysplasia, immunocompromised, or DES exposure in-utero.  Used Spanish interpreter [employer]rika McReynolds from [employer].

## 2017-11-17 ENCOUNTER — Ambulatory Visit
Admission: RE | Admit: 2017-11-17 | Discharge: 2017-11-17 | Disposition: A | Discharge status: Home / Self Care | Payer: No Typology Code available for payment source | Source: Ambulatory Visit | Attending: Family Medicine | Admitting: Family Medicine

## 2017-11-17 ENCOUNTER — Ambulatory Visit
Admission: RE | Admit: 2017-11-17 | Discharge: 2017-11-17 | Disposition: A | Discharge status: Home / Self Care | Payer: No Typology Code available for payment source | Source: Ambulatory Visit | Attending: Obstetrics and Gynecology | Admitting: Obstetrics and Gynecology

## 2017-11-17 ENCOUNTER — Other Ambulatory Visit: Payer: Self-pay | Admitting: Family Medicine

## 2017-11-17 ENCOUNTER — Encounter (HOSPITAL_COMMUNITY): Payer: Self-pay | Admitting: *Deleted

## 2017-11-17 DIAGNOSIS — N63 Unspecified lump in unspecified breast: Secondary | ICD-10-CM

## 2017-11-17 DIAGNOSIS — N6341 Unspecified lump in right breast, subareolar: Secondary | ICD-10-CM

## 2017-11-19 ENCOUNTER — Ambulatory Visit: Payer: Self-pay | Attending: Family Medicine | Admitting: Physician Assistant

## 2017-11-19 VITALS — BP 130/89 | HR 70 | Temp 98.3°F | Resp 16 | Wt 144.6 lb

## 2017-11-19 DIAGNOSIS — B079 Viral wart, unspecified: Secondary | ICD-10-CM

## 2017-11-19 DIAGNOSIS — R51 Headache: Secondary | ICD-10-CM

## 2017-11-19 DIAGNOSIS — Z79899 Other long term (current) drug therapy: Secondary | ICD-10-CM | POA: Insufficient documentation

## 2017-11-19 DIAGNOSIS — J309 Allergic rhinitis, unspecified: Secondary | ICD-10-CM | POA: Insufficient documentation

## 2017-11-19 DIAGNOSIS — R0981 Nasal congestion: Secondary | ICD-10-CM

## 2017-11-19 DIAGNOSIS — R519 Headache, unspecified: Secondary | ICD-10-CM

## 2017-11-19 MED ORDER — FLUTICASONE PROPIONATE 50 MCG/ACT NA SUSP: 2.0000 | Freq: Every day | NASAL | 6 refills | Status: DC

## 2017-11-19 MED ORDER — CETIRIZINE HCL 10 MG PO TABS: 10.0000 mg | ORAL_TABLET | Freq: Every day | ORAL | 11 refills | Status: DC

## 2017-11-19 MED FILL — FLUTICASONE PROP 50 MCG SPR: 50 | 30 days supply | Qty: 16 | Fill #0

## 2017-11-19 NOTE — Patient Instructions (Signed)
Verrugas (Warts) Las verrugas son pequeos crecimientos en la piel. Son comunes y pueden aparecer en diferentes zonas del cuerpo. Burkina Fasona persona puede tener una o muchas verrugas. La mayora de las verrugas no son dolorosas y no causan problemas. Sin embargo, las Programmer, multimediaverrugas pueden causar dolor si son grandes o si aparecen en determinadas partes del cuerpo en donde las verrugas tendrn que soportar cierta presin o friccin, por ejemplo, la planta del pie. En muchos casos, las verrugas no requieren TEFL teachertratamiento. Generalmente, desaparecen solas despus de transcurridos muchos meses o un par [employer]ink's Companyde aos. Hay varios tratamientos para las verrugas pero algunos causan problemas o no logran eliminar las verrugas. A veces, las verrugas desaparecen y luego vuelven a Research officer, trade unionaparecer. CAUSAS Un tipo de virus llamado virus del Geneticist, molecularpapiloma humano (VPH) causa las verrugas. Este virus puede propagarse de Neomia Dearuna persona a otra a travs del Chemical engineercontacto directo. Las verrugas tambin pueden diseminarse a otras partes del cuerpo si la persona se rasca la verruga y luego otra zona corporal. FACTORES DE RIESGO Es ms probable que las Engineer, manufacturingverrugas aparezcan en las siguientes personas:  Las que tienen entre 10 y 20aos.  Las personas cuyo sistema de defensa del organismo (sistema inmunitario) est debilitado. SNTOMAS Una verruga puede ser redonda u ovalada o tener una forma irregular. La mayora de ellas tiene una superficie spera. Su color puede variar desde el tono de la piel a amarillo claro, Child psychotherapistmarrn o gris. Generalmente, su tamao no supera la media pulgada (1,3centmetros). La mayora de las verrugas son indoloras, pero algunas pueden causar dolor cuando se ejerce presin sobre ellas. DIAGNSTICO Generalmente, una verruga se diagnostica por su aspecto. En algunos casos, se puede extraer Lauris Poaguna muestra de tejido (biopsia) para analizarla con un microscopio. TRATAMIENTO En muchos casos, las verrugas no requieren TEFL teachertratamiento. Si es Secondary school teachernecesario  administrar tratamiento, las opciones pueden incluir lo siguiente:  Psychologist, forensicAplicar soluciones medicinales, cremas o parches en la verruga. Pueden ser medicamentos recetados o de venta libre que suavizan la piel de modo que las capas se descamen gradualmente. En muchos casos, el medicamento se aplica una o dos veces por da y se cubre con un apsito.  Colocar cinta adhesiva aislante sobre la verruga (oclusin). Se dejar colocada la cinta adhesiva durante el tiempo que le haya indicado el mdico; luego, la reemplazar por una nueva. Esto se realiza hasta que la verruga desaparece.  Congelar la verruga con nitrgeno lquido (crioterapia).  Quemar la verruga con lo siguiente: ? Academic librarianTratamiento con lser. ? Una sonda con corriente elctrica (electrocauterizacin).  La inyeccin de Nature conservation officerun medicamento (antgeno de cndida) en la verruga para ayudar al sistema inmunitario del organismo a combatirla.  Ciruga para extirpar la verruga. INSTRUCCIONES PARA EL CUIDADO EN EL HOGAR  Aplquese los medicamentos de venta libre y los recetados solamente como se lo haya indicado el mdico.  No se aplique medicamentos de venta libre para tratar las verrugas en el rostro ni en los genitales sin antes consultarlo con el mdico.  No se rasque ni se toque una verruga.  Lvese las manos despus de tocar una verruga.  No se afeite los vellos que estn sobre una verruga.  Concurra a todas las visitas de control como se lo haya indicado el mdico. Esto es importante. SOLICITE ATENCIN MDICA SI:  Las verrugas no mejoran despus del [place]tratamiento.  Tiene enrojecimiento, hinchazn o dolor en la zona de una verruga.  La verruga le sangra, y el sangrado no se detiene cuando ejerce una presin leve sobre la verruga.  Es diabtico  y le sale una verruga. Esta informacin no tiene como fin reemplazar el consejo del mdico. Asegrese de hacerle al mdico cualquier pregunta que tenga. Document Released: 05/22/2005 Document Revised:  05/03/2015 Document Reviewed: 11/07/2014 Elsevier Interactive Patient Education  2018 Elsevier Inc.  

## 2017-11-19 NOTE — Progress Notes (Signed)
Patient ID: Cheryl Espinoza, female   DOB: 01/25/1976, 42 y.o.   MRN: [id number]    Cheryl Espinoza, is a 42 y.o. female  [id number]  [id number]  DOB - 07/09/1976  Subjective:  Chief Complaint and HPI: Cheryl Espinoza is a 42 y.o. female here today (Cheryl Espinoza with [employer] interpre[employer] translating).  HA X 2 weeks.  advil helps some.  She has had this type of HA in the past.  No vision changes.  HA is frontal.  She has been having some nasal congestion and sneezing.    Also little spots on her finger that have been there for about 4-6 weeks.    ROS:   Constitutional:  No f/c, No night sweats, No unexplained weight loss. EENT:  No vision changes, No blurry vision, No hearing changes. No additional mouth, throat, or ear problems.  Respiratory: No cough, No SOB Cardiac: No CP, no palpitations GI:  No abd pain, No N/V/D. GU: No Urinary s/sx Musculoskeletal: No joint pain Neuro: + headache, no dizziness, no motor weakness.  Skin: skin lesions Endocrine:  No polydipsia. No polyuria.  Psych: Denies SI/HI  No problems updated.  ALLERGIES: No Known Allergies  PAST MEDICAL HISTORY: History reviewed. No pertinent past medical history.  MEDICATIONS AT HOME: Prior to Admission medications   Medication Sig Start Date End Date Taking? Authorizing Provider  cetirizine (ZYRTEC) 10 MG tablet Take 1 tablet (10 mg total) by mouth daily. 11/19/17   Anders SimmondsMcClung, Briton Sellman M, PA-C  ferrous sulfate 325 (65 FE) MG tablet Take 1 tablet (325 mg total) by mouth 2 (two) times daily with a meal. Patient not taking: Reported on 11/11/2017 06/02/17   Lizbeth BarkHairston, Mandesia R, FNP  fluticasone (FLONASE) 50 MCG/ACT nasal spray Place 2 sprays into both nostrils daily. 11/19/17   Anders SimmondsMcClung, Elisha Mcgruder M, PA-C  metroNIDAZOLE (FLAGYL) 500 MG tablet Take 1 tablet (500 mg total) by mouth 2 (two) times daily. Patient not taking: Reported on 11/11/2017 09/03/17   Lizbeth BarkHairston, Mandesia R, FNP  omeprazole  (PRILOSEC) 20 MG capsule Take 1 capsule (20 mg total) by mouth daily. Patient not taking: Reported on 11/11/2017 05/26/17   Lizbeth BarkHairston, Mandesia R, FNP  propranolol (INDERAL) 40 MG tablet Take 1 tablet (40 mg total) by mouth 2 (two) times daily. Patient not taking: Reported on 11/11/2017 05/26/17   Lizbeth BarkHairston, Mandesia R, FNP  SUMAtriptan (IMITREX) 25 MG tablet Take 1 tablet (25 mg total) by mouth every 2 (two) hours as needed for migraine. May repeat in 2 hours if headache persists or recurs. Do not exceed 2 doses in 7 day period 05/26/17   Lizbeth BarkHairston, Mandesia R, FNP  Vitamin D, Ergocalciferol, (DRISDOL) 50000 units CAPS capsule TAKE ONE TABLET ONCE A WEEK BY MOUTH FOR 12 WEEKS. Patient not taking: Reported on 11/11/2017 06/02/17   Lizbeth BarkHairston, Mandesia R, FNP     Objective:  EXAM:   Vitals:   11/19/17 1521  BP: 130/89  Pulse: 70  Resp: 16  Temp: 98.3 F (36.8 C)  TempSrc: Oral  SpO2: 99%  Weight: 144 lb 9.6 oz (65.6 kg)    General appearance : A&OX3. NAD. Non-toxic-appearing HEENT: Atraumatic and Normocephalic.  PERRLA. EOM intact. Fundi benign.  TM full B. Mouth-MMM, post pharynx WNL w/o erythema, + PND.  Turbinates pale, boggy, and enlarged.   Neck: supple, no JVD. No cervical lymphadenopathy. No thyromegaly Chest/Lungs:  Breathing-non-labored, Good air entry bilaterally, breath sounds normal without rales, rhonchi, or wheezing  CVS: S1 S2 regular, no murmurs, gallops, rubs  Extremities:  Bilateral Lower Ext shows no edema, both legs are warm to touch with = pulse throughout R hand index and middle finger affected-multiple 3mm warts present.   Neurology:  CN II-XII grossly intact, Non focal.  Finger to nose/heel to shin intact Psych:  TP linear. J/I WNL. Normal speech. Appropriate eye contact and affect.  Skin:  No Rash  Data Review Lab Results  Component Value Date   HGBA1C 5.7 11/26/2016     Assessment & Plan   1. Nonintractable headache, unspecified chronicity pattern,  unspecified headache type No red flags-appears allergy/sinus related.  advil or tylenol as needed - fluticasone (FLONASE) 50 MCG/ACT nasal spray; Place 2 sprays into both nostrils daily.  Dispense: 16 g; Refill: 6  2. Viral warts, unspecified type Use OTC  3. Sinus congestion causing #1 - cetirizine (ZYRTEC) 10 MG tablet; Take 1 tablet (10 mg total) by mouth daily.  Dispense: 30 tablet; Refill: 11 - fluticasone (FLONASE) 50 MCG/ACT nasal spray; Place 2 sprays into both nostrils daily.  Dispense: 16 g; Refill: 6  4. Allergic rhinitis - cetirizine (ZYRTEC) 10 MG tablet; Take 1 tablet (10 mg total) by mouth daily.  Dispense: 30 tablet; Refill: 11  Patient have been counseled extensively about nutrition and exercise  Return if symptoms worsen or fail to improve.  The patient was given clear instructions to go to ER or return to medical center if symptoms don't improve, worsen or new problems develop. The patient verbalized understanding. The patient was told to call to get lab results if they haven't heard anything in the next week.     Georgian Co, PA-C [facility] [place], [place] [phone]   11/19/2017, 3:54 PM

## 2017-11-24 ENCOUNTER — Encounter: Payer: Self-pay | Admitting: *Deleted

## 2017-11-24 ENCOUNTER — Encounter: Payer: Self-pay | Admitting: Obstetrics & Gynecology

## 2017-11-24 ENCOUNTER — Ambulatory Visit (INDEPENDENT_AMBULATORY_CARE_PROVIDER_SITE_OTHER): Payer: Self-pay | Admitting: Obstetrics & Gynecology

## 2017-11-24 ENCOUNTER — Other Ambulatory Visit (HOSPITAL_COMMUNITY)
Admission: RE | Admit: 2017-11-24 | Discharge: 2017-11-24 | Disposition: A | Discharge status: Home / Self Care | Payer: Self-pay | Source: Ambulatory Visit | Attending: Obstetrics & Gynecology | Admitting: Obstetrics & Gynecology

## 2017-11-24 VITALS — BP 106/62 | HR 78 | Ht 58.27 in | Wt 143.4 lb

## 2017-11-24 DIAGNOSIS — R87612 Low grade squamous intraepithelial lesion on cytologic smear of cervix (LGSIL): Secondary | ICD-10-CM

## 2017-11-24 LAB — POCT PREGNANCY, URINE
PREG TEST UR: NEGATIVE
Preg Test, Ur: NEGATIVE

## 2017-11-24 NOTE — Progress Notes (Signed)
   Subjective:    Patient ID: Cheryl DeputyMaria Gloria Espinoza, female    DOB: 04/02/1976, 42 y.o.   MRN: [id number]  HPI 42 yo lady with 3 living kids here for a colpo. [facility] She uses condoms for contraception.  Review of Systems     Objective:   Physical Exam Breathing, conversing, and ambulating normally Well nourished, well hydrated Hispanic female, no apparent distress UPT negative, consent signed, time out done Cervix prepped with acetic acid. Transformation zone seen in its entirety. Colpo adequate. Entirely normal colpo findings ECC obtained. She tolerated the procedure well.      Assessment & Plan:  LGSIL pap with normal colpo If ECC is negative, then rec pap in a year with cotesting

## 2017-12-04 ENCOUNTER — Telehealth: Payer: Self-pay | Admitting: General Practice

## 2017-12-04 NOTE — Telephone Encounter (Signed)
Called patient with Cheryl Espinoza for interpreter & informed patient of results & recommended follow up. Patient verbalized understanding & had no questions

## 2017-12-04 NOTE — Telephone Encounter (Signed)
-----   Message from Allie BossierMyra C Dove, MD sent at 11/25/2017  1:22 PM EDT ----- Pap with cotesting in a year. Thanks

## 2017-12-10 ENCOUNTER — Ambulatory Visit: Payer: Self-pay | Attending: Internal Medicine

## 2018-04-02 ENCOUNTER — Ambulatory Visit: Payer: Self-pay

## 2018-04-06 ENCOUNTER — Ambulatory Visit: Payer: Self-pay | Attending: Family Medicine

## 2018-04-20 ENCOUNTER — Encounter: Payer: Self-pay | Admitting: Family Medicine

## 2018-04-20 ENCOUNTER — Ambulatory Visit: Payer: Self-pay | Attending: Family Medicine | Admitting: Family Medicine

## 2018-04-20 ENCOUNTER — Other Ambulatory Visit: Payer: Self-pay

## 2018-04-20 VITALS — BP 122/87 | HR 79 | Temp 98.4°F | Resp 18 | Ht 61.0 in | Wt 138.6 lb

## 2018-04-20 DIAGNOSIS — J309 Allergic rhinitis, unspecified: Secondary | ICD-10-CM

## 2018-04-20 DIAGNOSIS — D649 Anemia, unspecified: Secondary | ICD-10-CM

## 2018-04-20 DIAGNOSIS — G43709 Chronic migraine without aura, not intractable, without status migrainosus: Secondary | ICD-10-CM

## 2018-04-20 DIAGNOSIS — R0789 Other chest pain: Secondary | ICD-10-CM

## 2018-04-20 DIAGNOSIS — Z23 Encounter for immunization: Secondary | ICD-10-CM

## 2018-04-20 DIAGNOSIS — Z833 Family history of diabetes mellitus: Secondary | ICD-10-CM | POA: Insufficient documentation

## 2018-04-20 MED ORDER — FLUTICASONE PROPIONATE 50 MCG/ACT NA SUSP: 2.0000 | Freq: Every day | NASAL | 6 refills | Status: DC

## 2018-04-20 MED ORDER — SUMATRIPTAN SUCCINATE 25 MG PO TABS: ORAL_TABLET | ORAL | 6 refills | Status: DC

## 2018-04-20 MED ORDER — IBUPROFEN 600 MG PO TABS: 600.0000 mg | ORAL_TABLET | Freq: Three times a day (TID) | ORAL | 0 refills | Status: DC | PRN

## 2018-04-20 MED FILL — FLUTICASONE PROP 50 MCG SPR: 50 | 30 days supply | Qty: 16 | Fill #0

## 2018-04-20 MED FILL — IBUPROFEN 600 MG TABLET: 600 | 10 days supply | Qty: 30 | Fill #0

## 2018-04-20 MED FILL — ?SUMATRIPTAN SUCC 25 MG TAB: 25 | 30 days supply | Qty: 10 | Fill #0

## 2018-04-20 NOTE — Progress Notes (Signed)
Patient stated her main concern for today's visit was her headaches.

## 2018-04-20 NOTE — Patient Instructions (Signed)
Cefalea migraosa  Migraine Headache  Una cefalea migraosa es un dolor muy intenso y punzante en uno o ambos lados de la cabeza. Las migraas tambin pueden causar otros sntomas. Hable con su mdico sobre los factores que pueden causar (desencadenar) las cefaleas migraosas.  Siga estas indicaciones en su casa:  Medicamentos   Tome los medicamentos de venta libre y los recetados solamente como se lo haya indicado el mdico.   No conduzca ni use maquinaria pesada mientras toma analgsicos recetados.   A fin de prevenir o tratar el estreimiento mientras toma analgsicos recetados, el mdico puede recomendarle lo siguiente:  ? Beber suficiente lquido para mantener el pis (orina) claro o de color amarillo plido.  ? Tomar medicamentos recetados o de venta libre.  ? Comer alimentos ricos en fibra. Entre ellos, frutas y verduras frescas, cereales integrales y frijoles.  ? Limitar los alimentos con alto contenido de grasas y azcares procesados. Estos incluyen alimentos fritos y dulces.  Estilo de vida   Evite el alcohol.   No consuma ningn producto que contenga nicotina o tabaco, como cigarrillos y cigarrillos electrnicos. Si necesita ayuda para dejar de fumar, consulte al mdico.   Duerma como mnimo 8horas todas las noches.   Evite las situaciones de estrs.  Instrucciones generales     Lleve un registro diario para averiguar lo que puede provocar la migraa. Por ejemplo, escriba:  ? Lo que usted come y bebe.  ? Cunto tiempo duerme.  ? Algn cambio en lo que come o bebe.  ? Algn cambio en sus medicamentos.   Si tiene una migraa:  ? Evite los factores que empeoren los sntomas, como las luces brillantes.  ? Resulta til acostarse en una habitacin oscura y silenciosa.  ? No conduzca vehculos ni opere maquinaria pesada.  ? Pregntele al mdico qu actividades son seguras para usted.   Concurra a todas las visitas de control como se lo haya indicado el mdico. Esto es importante.  Comunquese con un  mdico si:   Tiene una migraa que es diferente o peor de sus migraas habituales.  Solicite ayuda de inmediato si:   La migraa empeora mucho.   Tiene fiebre.   Presenta rigidez en el cuello.   Tiene dificultad para ver.   Siente debilidad en los msculos o que no puede controlarlos.   Comienza a perder el equilibrio continuamente.   Comienza a tener dificultad para caminar.   Pierde el conocimiento (se desmaya).  Esta informacin no tiene como fin reemplazar el consejo del mdico. Asegrese de hacerle al mdico cualquier pregunta que tenga.  Document Released: 11/08/2008 Document Revised: 11/19/2016 Document Reviewed: 01/29/2016  Elsevier Interactive Patient Education  2018 Elsevier Inc.

## 2018-04-20 NOTE — Progress Notes (Signed)
Subjective:    Patient ID: Cheryl Espinoza, female    DOB: 06/14/1976, 42 y.o.   MRN: [id number]   -Due to language barrier, a video interpreter service was used at today's visit.  HPI       42 yo female who presents secondary to the complaint of a recurrent headache. Patient states that she has had headaches for many years and her headaches last for hours. She feels nauseous at times with the onset of her headaches but usually she is just bothered by light as well as noise which tends to make her headaches worse.  Patient states that if she is able to go somewhere dark and to rest that she then feels better.  Patient denies any changes in her visual field prior to onset of headaches.  Patient states that she was prescribed something at this office once which seemed to help with her headaches.  Patient states when she had the previously prescribed medication, she could take it and her headaches will go away.  Patient states that she currently takes over-the-counter Tylenol which decreases her headaches but does not seem to help them go away any faster.  Patient also with some sneezing and nasal congestion and she would like refill of a nasal spray she used in the past to help with nasal congestion.  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  Patient states that she has noticed some recurrent discomfort in her left upper chest and sometimes has sensation of pain in her upper chest which is dull can last for a few minutes.  Patient denies shortness of breath.  Patient does not feel sweaty or nauseous when she is having the chest pain.  Patient does have a history of anemia.  Patient does have some occasional fatigue but she believes that this is secondary to the length of her work days.  Patient denies any peripheral edema. Social History   Tobacco Use  . Smoking status: Never Smoker  . Smokeless tobacco: Never Used  Substance Use  Topics  . Alcohol use: No    Alcohol/week: 0.0 standard drinks  . Drug use: No   Family History  Problem Relation Age of Onset  . Hyperlipidemia Mother   . Diabetes Father   . Diabetes Sister   . Cancer Neg Hx   . Heart disease Neg Hx    Past Surgical History:  Procedure Laterality Date  . CESAREAN SECTION  2000, 2002   No Known Allergies    Review of Systems  Constitutional: Positive for fatigue. Negative for chills and fever.  HENT: Positive for congestion and sneezing. Negative for sore throat and trouble swallowing.   Eyes: Positive for photophobia (Occurs with headaches). Negative for discharge and itching.  Respiratory: Negative for cough and shortness of breath.   Cardiovascular: Positive for chest pain (Left upper chest with recurrent pain, usually associated with arm movements and lifting). Negative for palpitations and leg swelling.  Gastrointestinal: Positive for nausea (Occasional, can occur with headaches). Negative for abdominal pain, constipation, diarrhea and vomiting.  Genitourinary: Negative for dysuria and frequency.  Allergic/Immunologic: Positive for environmental allergies.  Neurological: Positive for headaches. Negative for dizziness and weakness.  Psychiatric/Behavioral: Negative for sleep disturbance and suicidal ideas.       Objective:   Physical Exam  Constitutional: She is oriented to person, place, and time. She appears well-developed and well-nourished. No distress.  HENT:  Head: Normocephalic  and atraumatic.  Right Ear: Tympanic membrane, external ear and ear canal normal.  Left Ear: Tympanic membrane, external ear and ear canal normal.  Nose: Mucosal edema (Moderate) and rhinorrhea (Mild) present. Right sinus exhibits no maxillary sinus tenderness and no frontal sinus tenderness. Left sinus exhibits no maxillary sinus tenderness and no frontal sinus tenderness.  Mouth/Throat: Oropharynx is clear and moist.  Eyes: Conjunctivae and EOM are  normal. Right conjunctiva is not injected. Right conjunctiva has no hemorrhage. Left conjunctiva is not injected. Left conjunctiva has no hemorrhage. Right eye exhibits normal extraocular motion and no nystagmus. Left eye exhibits normal extraocular motion and no nystagmus.  Neck: Normal range of motion. Neck supple. No thyromegaly present.  Cardiovascular: Normal rate and regular rhythm.  Pulmonary/Chest: Effort normal and breath sounds normal.  Abdominal: Soft. There is no tenderness.  Musculoskeletal: Normal range of motion. She exhibits tenderness (Reproducible left chest wall tenderness with palpation, range of motion of the left arm).  Neurological: She is alert and oriented to person, place, and time. No cranial nerve deficit.  Psychiatric: She has a normal mood and affect.  Vitals reviewed.   BP 122/87 (BP Location: Left Arm, Patient Position: Sitting, Cuff Size: Normal)   Pulse 79   Temp 98.4 F (36.9 C) (Oral)   Resp 18   Ht 5\' 1"  (1.549 m)   Wt 138 lb 9.6 oz (62.9 kg)   LMP 04/13/2018 (Exact Date)   SpO2 98%   BMI 26.19 kg/m        Assessment & Plan:  1. Chronic migraine without aura without status migrainosus, not intractable Patient with headache symptoms consistent with migraine and reports prior use with Imitrex through interpreter.  Patient provided with refill.  If patient however has a change in location, frequency or any neurologic symptoms such as dizziness, worsening of headache or intractable vomiting patient should return to clinic for reassessment.  Patient education in Spanish provided regarding migraine headaches. - SUMAtriptan (IMITREX) 25 MG tablet; May repeat in 2 hours if headache persists or recurs. Do not exceed 4 doses in a 24 hour period  Dispense: 10 tablet; Refill: 6  2. Allergic rhinitis, unspecified seasonality, unspecified trigger Patient with complaint of recurrent nasal congestion with occasional sneezing.  Patient has used Flonase in the past  with success and patient was given refill of this medication. - fluticasone (FLONASE) 50 MCG/ACT nasal spray; Place 2 sprays into both nostrils daily.  Dispense: 16 g; Refill: 6  3. Chest wall pain Patient with complaint of pain in her left upper chest near her collarbone.  This pain is reproducible on examination with stretching of the pectoral muscles when patient is asked to do range of motion of her left arm.  Patient also has history of anemia which can sometimes contribute to chest pain.  Patient will have CBC in follow-up.  Patient also given prescription for ibuprofen 600 mg she can take 1 every 8 hours as needed for chest wall pain but should take after eating to help avoid stomach upset.  If patient has continued pain despite use of ibuprofen or worsening of pain, she should return to clinic for further evaluation.  Discussed obtaining chest x-ray and further imaging and possible EKG if chest pain continues or worsens but based on examination, the most likely etiology of patient's chest pain is musculoskeletal and not related to cardiac or pulmonary issues. - CBC with Differential - ibuprofen (ADVIL,MOTRIN) 600 MG tablet; Take 1 tablet (600 mg total) by mouth  every 8 (eight) hours as needed (for chest wall pain; take after eating).  Dispense: 30 tablet; Refill: 0  4. Anemia, unspecified type Patient with a history of anemia and CBC will be obtained to today's visit and patient will be notified if she needs to resume use of iron therapy.  Anemia is most likely related to chronic blood loss secondary to menses. - CBC with Differential  5. Need for immunization against influenza Patient was offered influenza immunization at today's visit which she agreed to have.  Patient was given educational handout regarding immunization vaccine as well as possible side effects - Flu Vaccine QUAD 36+ mos   An After Visit Summary was printed and given to the patient.  Return in about 6 months (around  10/21/2018) for headache.

## 2018-04-21 LAB — CBC WITH DIFFERENTIAL/PLATELET
Basophils Absolute: 0 x10E3/uL (ref 0.0–0.2)
Basos: 0 %
EOS (ABSOLUTE): 0.2 x10E3/uL (ref 0.0–0.4)
Eos: 2 %
Hematocrit: 35 % (ref 34.0–46.6)
Hemoglobin: 11.8 g/dL (ref 11.1–15.9)
Immature Grans (Abs): 0 x10E3/uL (ref 0.0–0.1)
Immature Granulocytes: 0 %
Lymphocytes Absolute: 3 x10E3/uL (ref 0.7–3.1)
Lymphs: 35 %
MCH: 30.3 pg (ref 26.6–33.0)
MCHC: 33.7 g/dL (ref 31.5–35.7)
MCV: 90 fL (ref 79–97)
Monocytes Absolute: 0.6 x10E3/uL (ref 0.1–0.9)
Monocytes: 8 %
Neutrophils Absolute: 4.7 x10E3/uL (ref 1.4–7.0)
Neutrophils: 55 %
Platelets: 388 x10E3/uL (ref 150–450)
RBC: 3.9 x10E6/uL (ref 3.77–5.28)
RDW: 16.8 % — ABNORMAL HIGH (ref 12.3–15.4)
WBC: 8.5 x10E3/uL (ref 3.4–10.8)

## 2018-04-22 ENCOUNTER — Telehealth: Payer: Self-pay

## 2018-04-22 NOTE — Telephone Encounter (Signed)
-----   Message from Cain Saupeammie [name], MD sent at 04/21/2018 12:07 PM EDT ----- Notify patient of normal CBC (as previously discussed)

## 2018-04-22 NOTE — Telephone Encounter (Signed)
[employer] used Angelica [id number]. Patient verified DOB, was informed and verbalized understanding of most recent lab results and  had no further questions.

## 2018-05-21 ENCOUNTER — Ambulatory Visit: Payer: Self-pay | Attending: Family Medicine | Admitting: Physician Assistant

## 2018-05-21 VITALS — BP 115/73 | HR 83 | Temp 98.3°F | Resp 18 | Ht 60.0 in | Wt 139.0 lb

## 2018-05-21 DIAGNOSIS — Z79899 Other long term (current) drug therapy: Secondary | ICD-10-CM | POA: Insufficient documentation

## 2018-05-21 DIAGNOSIS — K1379 Other lesions of oral mucosa: Secondary | ICD-10-CM | POA: Insufficient documentation

## 2018-05-21 DIAGNOSIS — Z789 Other specified health status: Secondary | ICD-10-CM

## 2018-05-21 DIAGNOSIS — S025XXB Fracture of tooth (traumatic), initial encounter for open fracture: Secondary | ICD-10-CM

## 2018-05-21 DIAGNOSIS — X58XXXA Exposure to other specified factors, initial encounter: Secondary | ICD-10-CM | POA: Insufficient documentation

## 2018-05-21 DIAGNOSIS — S025XXA Fracture of tooth (traumatic), initial encounter for closed fracture: Secondary | ICD-10-CM | POA: Insufficient documentation

## 2018-05-21 NOTE — Progress Notes (Signed)
Patient ID: Cheryl Espinoza, female   DOB: April 30, 1976, 42 y.o.   MRN: [id number]     Cheryl Espinoza, is a 42 y.o. female  [id number]  [id number]  DOB - [birth year]  Subjective:  Chief Complaint and HPI: Cheryl Espinoza is a 42 y.o. female here today Pain in L side of mouth/teeth for about 8 days after breaking part of a cap/filling off a tooth.  No fever/abscess/infection.  Some pain with hot and cold.    [name] with [employer] interpreters translating  ROS:   Constitutional:  No f/c, No night sweats, No unexplained weight loss. EENT:  No vision changes, No blurry vision, No hearing changes. No other mouth, throat, or ear problems.  Respiratory: No cough, No SOB Cardiac: No CP, no palpitations GI:  No abd pain, No N/V/D. GU: No Urinary s/sx Musculoskeletal: No joint pain Neuro: No headache, no dizziness, no motor weakness.  Skin: No rash Endocrine:  No polydipsia. No polyuria.  Psych: Denies SI/HI  No problems updated.  ALLERGIES: No Known Allergies  PAST MEDICAL HISTORY: History reviewed. No pertinent past medical history.  MEDICATIONS AT HOME: Prior to Admission medications   Medication Sig Start Date End Date Taking? Authorizing Provider  fluticasone (FLONASE) 50 MCG/ACT nasal spray Place 2 sprays into both nostrils daily. 04/20/18  Yes [name], [name], MD  ibuprofen (ADVIL,MOTRIN) 600 MG tablet Take 1 tablet (600 mg total) by mouth every 8 (eight) hours as needed (for chest wall pain; take after eating). 04/20/18  Yes [name], [name], MD  omeprazole (PRILOSEC) 20 MG capsule Take 1 capsule (20 mg total) by mouth daily. 05/26/17  Yes Hairston, [name], FNP  propranolol (INDERAL) 40 MG tablet Take 1 tablet (40 mg total) by mouth 2 (two) times daily. 05/26/17  Yes [name], FNP  SUMAtriptan (IMITREX) 25 MG tablet May repeat in 2 hours if headache persists or recurs. Do not exceed 4 doses in a 24 hour period 04/20/18  Yes [name], [name], MD    cetirizine (ZYRTEC) 10 MG tablet Take 1 tablet (10 mg total) by mouth daily. Patient not taking: Reported on 04/20/2018 11/19/17   [name], PA-C  ferrous sulfate 325 (65 FE) MG tablet Take 1 tablet (325 mg total) by mouth 2 (two) times daily with a meal. Patient not taking: Reported on 11/11/2017 06/02/17   [name], FNP     Objective:  EXAM:   Vitals:   05/21/18 1520  BP: 115/73  Pulse: 83  Resp: 18  Temp: 98.3 F (36.8 C)  TempSrc: Oral  SpO2: 99%  Weight: 139 lb (63 kg)  Height: 5' (1.524 m)    General appearance : A&OX3. NAD. Non-toxic-appearing HEENT: Atraumatic and Normocephalic.  PERRLA. EOM intact. Mouth-MMM, post pharynx WNL w/o erythema, No PND.  Poor dentition, broken cap L front lower #22 Neck: supple, no JVD. No cervical lymphadenopathy. No thyromegaly Chest/Lungs:  Breathing-non-labored, Good air entry bilaterally, breath sounds normal without rales, rhonchi, or wheezing  CVS: S1 S2 regular, no murmurs, gallops, rubs  Neurology:  CN II-XII grossly intact, Non focal.   Psych:  TP linear. J/I WNL. Normal speech. Appropriate eye contact and affect.  Skin:  No Rash  Data Review Lab Results  Component Value Date   HGBA1C 5.7 11/26/2016     Assessment & Plan   1. Open fracture of tooth, initial encounter No sign abscess/infection of surrounding gum/tissue - Ambulatory referral to Dentistry  2. Language barrier [employer] interpreters used and additional time performing  visit was required.    Patient have been counseled extensively about nutrition and exercise  Return if symptoms worsen or fail to improve, for Dr Jillyn Hidden.  The patient was given clear instructions to go to ER or return to medical center if symptoms don't improve, worsen or new problems develop. The patient verbalized understanding. The patient was told to call to get lab results if they haven't heard anything in the next week.     Georgian Co, PA-C [facility] [place], [place] [phone]   05/21/2018, 3:34 PM

## 2018-09-14 ENCOUNTER — Ambulatory Visit: Payer: Self-pay | Admitting: Family Medicine

## 2018-09-16 ENCOUNTER — Ambulatory Visit: Payer: Self-pay | Attending: Family Medicine | Admitting: Physician Assistant

## 2018-09-16 VITALS — BP 128/85 | HR 76 | Temp 98.5°F | Resp 16 | Wt 142.8 lb

## 2018-09-16 DIAGNOSIS — J309 Allergic rhinitis, unspecified: Secondary | ICD-10-CM

## 2018-09-16 DIAGNOSIS — G43709 Chronic migraine without aura, not intractable, without status migrainosus: Secondary | ICD-10-CM

## 2018-09-16 DIAGNOSIS — D649 Anemia, unspecified: Secondary | ICD-10-CM

## 2018-09-16 DIAGNOSIS — Z862 Personal history of diseases of the blood and blood-forming organs and certain disorders involving the immune mechanism: Secondary | ICD-10-CM

## 2018-09-16 DIAGNOSIS — Z789 Other specified health status: Secondary | ICD-10-CM

## 2018-09-16 MED ORDER — SUMATRIPTAN SUCCINATE 25 MG PO TABS: ORAL_TABLET | ORAL | 6 refills | Status: DC

## 2018-09-16 MED ORDER — FLUTICASONE PROPIONATE 50 MCG/ACT NA SUSP: 2.0000 | Freq: Every day | NASAL | 6 refills | Status: DC

## 2018-09-16 NOTE — Progress Notes (Signed)
Patient ID: Cheryl Espinoza, female   DOB: 1976/05/07, 43 y.o.   MRN: [id number]     Cheryl Espinoza, is a 43 y.o. female  [id number]  [id number]  DOB - October 16, 1975  Subjective:  Chief Complaint and HPI: Cheryl Espinoza is a 43 y.o. female here Headache about 2 days a week.  Eyes burn.  No vision changes.  Takes tylenol which helps some.  Out of imitrex. Needs RF.  No labs in a while.  No vomiting.  No abdominal pain.  No changes in HA.  She has had these for years.    Paula Compton with [employer] translating.    ROS:   Constitutional:  No f/c, No night sweats, No unexplained weight loss. EENT:  No vision changes, No blurry vision, No hearing changes. No mouth, throat, or ear problems.  Respiratory: No cough, No SOB Cardiac: No CP, no palpitations GI:  No abd pain, No N/V/D. GU: No Urinary s/sx Musculoskeletal: No joint pain Neuro: + headache, no dizziness, no motor weakness.  Skin: No rash Endocrine:  No polydipsia. No polyuria.  Psych: Denies SI/HI  No problems updated.  ALLERGIES: No Known Allergies  PAST MEDICAL HISTORY: No past medical history on file.  MEDICATIONS AT HOME: Prior to Admission medications   Medication Sig Start Date End Date Taking? Authorizing Provider  cetirizine (ZYRTEC) 10 MG tablet Take 1 tablet (10 mg total) by mouth daily. Patient not taking: Reported on 04/20/2018 11/19/17   [name], PA-C  ferrous sulfate 325 (65 FE) MG tablet Take 1 tablet (325 mg total) by mouth 2 (two) times daily with a meal. Patient not taking: Reported on 11/11/2017 06/02/17   [name], FNP  fluticasone (FLONASE) 50 MCG/ACT nasal spray Place 2 sprays into both nostrils daily. 09/16/18   [name], PA-C  ibuprofen (ADVIL,MOTRIN) 600 MG tablet Take 1 tablet (600 mg total) by mouth every 8 (eight) hours as needed (for chest wall pain; take after eating). 04/20/18   [name], [name], MD  omeprazole (PRILOSEC) 20 MG  capsule Take 1 capsule (20 mg total) by mouth daily. 05/26/17   [name], FNP  propranolol (INDERAL) 40 MG tablet Take 1 tablet (40 mg total) by mouth 2 (two) times daily. 05/26/17   [name], FNP  SUMAtriptan (IMITREX) 25 MG tablet May repeat in 2 hours if headache persists or recurs. Do not exceed 4 doses in a 24 hour period 09/16/18   [name], PA-C     Objective:  EXAM:   Vitals:   09/16/18 1525  BP: 128/85  Pulse: 76  Resp: 16  Temp: 98.5 F (36.9 C)  TempSrc: Oral  SpO2: 99%  Weight: 142 lb 12.8 oz (64.8 kg)    General appearance : A&OX3. NAD. Non-toxic-appearing HEENT: Atraumatic and Normocephalic.  PERRLA. EOM intact.  TM clear B. Mouth-MMM, post pharynx WNL w/o erythema, No PND. Neck: supple, no JVD. No cervical lymphadenopathy. No thyromegaly Chest/Lungs:  Breathing-non-labored, Good air entry bilaterally, breath sounds normal without rales, rhonchi, or wheezing  CVS: S1 S2 regular, no murmurs, gallops, rubs  Extremities: Bilateral Lower Ext shows no edema, both legs are warm to touch with = pulse throughout Neurology:  CN II-XII grossly intact, Non focal.   Psych:  TP linear. J/I WNL. Normal speech. Appropriate eye contact and affect.  Skin:  No Rash  Data Review Lab Results  Component Value Date   HGBA1C 5.7 11/26/2016     Assessment & Plan  1. Chronic migraine without aura without status migrainosus, not intractable No red falgs in history or exam - TSH - Vitamin D, 25-hydroxy - Comprehensive metabolic panel - SUMAtriptan (IMITREX) 25 MG tablet; May repeat in 2 hours if headache persists or recurs. Do not exceed 4 doses in a 24 hour period  Dispense: 10 tablet; Refill: 6  2. Allergic rhinitis, unspecified seasonality, unspecified trigger - fluticasone (FLONASE) 50 MCG/ACT nasal spray; Place 2 sprays into both nostrils daily.  Dispense: 16 g; Refill: 6  3. Language barrier [employer] interpreters used and additional time  performing visit was required.   4. Anemia, unspecified type - CBC with Differential/Platelet     Patient have been counseled extensively about nutrition and exercise  Return in about 6 months (around 03/17/2019) for Dr Jillyn HiddenFulp; HA and anemia.  The patient was given clear instructions to go to ER or return to medical center if symptoms don't improve, worsen or new problems develop. The patient verbalized understanding. The patient was told to call to get lab results if they haven't heard anything in the next week.     Georgian CoAngela McClung, PA-C [facility] [place], [place] [phone]   09/16/2018, 4:20 PM

## 2018-09-16 NOTE — Patient Instructions (Signed)
Make an appointment with [facility]ol[facility] for April or May 2020 for your pap smear

## 2018-09-17 ENCOUNTER — Telehealth: Payer: Self-pay | Admitting: Family Medicine

## 2018-09-17 NOTE — Telephone Encounter (Signed)
I LVM to Pt informed her, the bill from [facility] will be not cover under the [facility] [employer]FA,, since is a company that we are not working with the financial program...Marland KitchenMarland Kitchen

## 2018-09-21 ENCOUNTER — Ambulatory Visit: Payer: Self-pay | Attending: Family Medicine

## 2018-09-21 DIAGNOSIS — Z862 Personal history of diseases of the blood and blood-forming organs and certain disorders involving the immune mechanism: Secondary | ICD-10-CM | POA: Insufficient documentation

## 2018-09-21 DIAGNOSIS — G43709 Chronic migraine without aura, not intractable, without status migrainosus: Secondary | ICD-10-CM | POA: Insufficient documentation

## 2018-09-22 LAB — COMPREHENSIVE METABOLIC PANEL
A/G RATIO: 1.2 (ref 1.2–2.2)
ALK PHOS: 60 IU/L (ref 39–117)
ALT: 13 IU/L (ref 0–32)
AST: 17 IU/L (ref 0–40)
Albumin: 4.3 g/dL (ref 3.8–4.8)
BILIRUBIN TOTAL: 0.3 mg/dL (ref 0.0–1.2)
BUN/Creatinine Ratio: 17 (ref 9–23)
BUN: 11 mg/dL (ref 6–24)
CALCIUM: 9.2 mg/dL (ref 8.7–10.2)
CHLORIDE: 101 mmol/L (ref 96–106)
CO2: 20 mmol/L (ref 20–29)
Creatinine, Ser: 0.66 mg/dL (ref 0.57–1.00)
GFR calc Af Amer: 128 mL/min/{1.73_m2} (ref >59)
GFR, EST NON AFRICAN AMERICAN: 111 mL/min/{1.73_m2} (ref >59)
GLOBULIN, TOTAL: 3.5 g/dL (ref 1.5–4.5)
Glucose: 89 mg/dL (ref 65–99)
POTASSIUM: 4.5 mmol/L (ref 3.5–5.2)
SODIUM: 137 mmol/L (ref 134–144)
Total Protein: 7.8 g/dL (ref 6.0–8.5)

## 2018-09-22 LAB — CBC WITH DIFFERENTIAL/PLATELET
BASOS ABS: 0 10*3/uL (ref 0.0–0.2)
BASOS: 0 %
EOS (ABSOLUTE): 0.1 10*3/uL (ref 0.0–0.4)
Eos: 1 %
Hematocrit: 32 % — ABNORMAL LOW (ref 34.0–46.6)
Hemoglobin: 11 g/dL — ABNORMAL LOW (ref 11.1–15.9)
IMMATURE GRANULOCYTES: 0 %
Immature Grans (Abs): 0 10*3/uL (ref 0.0–0.1)
Lymphocytes Absolute: 2.2 10*3/uL (ref 0.7–3.1)
Lymphs: 30 %
MCH: 30.2 pg (ref 26.6–33.0)
MCHC: 34.4 g/dL (ref 31.5–35.7)
MCV: 88 fL (ref 79–97)
Monocytes Absolute: 0.7 10*3/uL (ref 0.1–0.9)
Monocytes: 10 %
NEUTROS PCT: 59 %
Neutrophils Absolute: 4.5 10*3/uL (ref 1.4–7.0)
PLATELETS: 326 10*3/uL (ref 150–450)
RBC: 3.64 x10E6/uL — ABNORMAL LOW (ref 3.77–5.28)
RDW: 15 % (ref 11.7–15.4)
WBC: 7.6 10*3/uL (ref 3.4–10.8)

## 2018-09-22 LAB — TSH: TSH: 0.87 u[IU]/mL (ref 0.450–4.500)

## 2018-09-22 LAB — VITAMIN D 25 HYDROXY (VIT D DEFICIENCY, FRACTURES): Vit D, 25-Hydroxy: 11.3 ng/mL — ABNORMAL LOW (ref 30.0–100.0)

## 2018-09-23 ENCOUNTER — Other Ambulatory Visit: Payer: Self-pay | Admitting: Physician Assistant

## 2018-09-23 MED ORDER — VITAMIN D (ERGOCALCIFEROL) 1.25 MG (50000 UNIT) PO CAPS
50000.0000 [IU] | ORAL_CAPSULE | ORAL | 0 refills | Status: DC
Start: 2018-09-23 — End: 2019-12-24

## 2018-09-23 MED FILL — VIT D2 1.25 MG (50,000 UNIT: 1.25 MG | 28 days supply | Qty: 4 | Fill #0

## 2018-09-28 ENCOUNTER — Telehealth: Payer: Self-pay

## 2018-09-28 NOTE — Telephone Encounter (Signed)
Patient was called and informed of lab results via interpreter([id number]). 

## 2018-09-28 NOTE — Telephone Encounter (Signed)
-----   Message from [name], [place] sent at 09/23/2018  9:49 AM EST ----- Please call patient and let them that their vitamin D is low.  This can contribute to muscle aches, anxiety, fatigue, and depression.  I have sent a prescription to the pharmacy for them to take once a week.  We will recheck this level in 3-4 months.  She is a little anemic and needs to continue/restart her Iron.  Other labs are normal.  Follow-up as planned.  Thanks, Georgian Co, PA-C

## 2018-10-19 ENCOUNTER — Encounter: Payer: Self-pay | Admitting: Family Medicine

## 2018-10-23 ENCOUNTER — Ambulatory Visit: Payer: Self-pay | Attending: Family Medicine

## 2018-11-23 ENCOUNTER — Encounter: Payer: Self-pay | Admitting: Family Medicine

## 2018-12-05 NOTE — Telephone Encounter (Signed)
error 

## 2018-12-28 ENCOUNTER — Encounter: Payer: Self-pay | Admitting: Family Medicine

## 2019-02-01 ENCOUNTER — Encounter: Payer: Self-pay | Admitting: Family Medicine

## 2019-03-03 ENCOUNTER — Other Ambulatory Visit (HOSPITAL_COMMUNITY): Payer: Self-pay | Admitting: *Deleted

## 2019-03-03 DIAGNOSIS — Z1231 Encounter for screening mammogram for malignant neoplasm of breast: Secondary | ICD-10-CM

## 2019-03-31 ENCOUNTER — Ambulatory Visit: Payer: Self-pay | Admitting: Family Medicine

## 2019-04-22 ENCOUNTER — Other Ambulatory Visit: Payer: Self-pay

## 2019-04-22 ENCOUNTER — Encounter: Payer: Self-pay | Admitting: Pharmacist

## 2019-04-22 ENCOUNTER — Encounter: Payer: Self-pay | Admitting: Family Medicine

## 2019-04-22 ENCOUNTER — Ambulatory Visit (HOSPITAL_BASED_OUTPATIENT_CLINIC_OR_DEPARTMENT_OTHER): Payer: Self-pay | Admitting: Pharmacist

## 2019-04-22 ENCOUNTER — Ambulatory Visit: Payer: Self-pay | Attending: Family Medicine | Admitting: Family Medicine

## 2019-04-22 VITALS — BP 112/76 | HR 90 | Temp 98.9°F | Resp 18 | Ht 60.0 in | Wt 143.0 lb

## 2019-04-22 DIAGNOSIS — K219 Gastro-esophageal reflux disease without esophagitis: Secondary | ICD-10-CM

## 2019-04-22 DIAGNOSIS — G44209 Tension-type headache, unspecified, not intractable: Secondary | ICD-10-CM

## 2019-04-22 DIAGNOSIS — Z124 Encounter for screening for malignant neoplasm of cervix: Secondary | ICD-10-CM

## 2019-04-22 DIAGNOSIS — Z113 Encounter for screening for infections with a predominantly sexual mode of transmission: Secondary | ICD-10-CM

## 2019-04-22 DIAGNOSIS — Z23 Encounter for immunization: Secondary | ICD-10-CM

## 2019-04-22 DIAGNOSIS — G43709 Chronic migraine without aura, not intractable, without status migrainosus: Secondary | ICD-10-CM

## 2019-04-22 MED ORDER — PROPRANOLOL HCL 40 MG PO TABS: 40.0000 mg | ORAL_TABLET | Freq: Two times a day (BID) | ORAL | 5 refills | Status: DC

## 2019-04-22 MED ORDER — SUMATRIPTAN SUCCINATE 25 MG PO TABS: ORAL_TABLET | ORAL | 5 refills | Status: DC

## 2019-04-22 MED ORDER — OMEPRAZOLE 20 MG PO CPDR: 20.0000 mg | DELAYED_RELEASE_CAPSULE | Freq: Every day | ORAL | 5 refills | Status: DC

## 2019-04-22 MED FILL — OMEPRAZOLE 20 MG CAP: 20 | 30 days supply | Qty: 30 | Fill #0

## 2019-04-22 MED FILL — PROPRANOLOL 40 MG TABLET: 40 | 30 days supply | Qty: 60 | Fill #0

## 2019-04-22 MED FILL — SUMATRIPTAN SUCC 25 MG TAB: 25 | 30 days supply | Qty: 10 | Fill #0

## 2019-04-22 NOTE — Progress Notes (Signed)
Established Patient Office Visit  Subjective:  Patient ID: Cheryl Espinoza, female    DOB: [birth year]  Age: 43 y.o. MRN: [id number]  CC:  Chief Complaint  Patient presents with  . Gynecologic Exam    HPI Cheryl Espinoza presents for Pap smear as a screening test for cervical cancer.  She has had an abnormal Pap smear in the past.  She believes that she was referred to GYN but she does not recall what treatment was done.  She denies any current issues with pelvic pain or vaginal pain.  She believes that she may have some vaginal discharge.  She is married and has been with the same partner.  She denies any unusual vaginal bleeding.       She also needs refills for medications for other chronic issues including possible migraine type headaches and for acid reflux.  She states that she needs a refill of Imitrex and Inderal for prevention and treatment of headaches.  She also these new prescription for omeprazole as this is helped with her acid reflux symptoms.        She denies any other concerns at today's visit.  She has had no dizziness or focal numbness/weakness, no chest pain or palpitations, no shortness of breath or cough, no abdominal pain-no nausea/vomiting/diarrhea or constipation.  No urinary frequency, urgency or dysuria.  She has found no abnormalities on self breast exam but has had prior mammogram.  Past Medical History:  Diagnosis Date  . GERD (gastroesophageal reflux disease)   . History of abnormal cervical Pap smear   . History of vitamin D deficiency   . Tension headache     Past Surgical History:  Procedure Laterality Date  . CESAREAN SECTION  2000, 2002     Family History  Problem Relation Age of Onset  . Hyperlipidemia Mother   . Diabetes Father   . Diabetes Sister   . Cancer Neg Hx   . Heart disease Neg Hx     Social History   Socioeconomic History  . Marital status: Single    Spouse name: Not on file  . Number of children:  3   . Years of education: 9   . Highest education level: Not on file  Occupational History  . Occupation: Chiropractor Prep   Social Needs  . Financial resource strain: Not on file  . Food insecurity    Worry: Not on file    Inability: Not on file  . Transportation needs    Medical: Not on file    Non-medical: Not on file  Tobacco Use  . Smoking status: Never Smoker  . Smokeless tobacco: Never Used  Substance and Sexual Activity  . Alcohol use: No    Alcohol/week: 0.0 standard drinks  . Drug use: No  . Sexual activity: Never    Birth control/protection: None  Lifestyle  . Physical activity    Days per week: 2 days    Minutes per session: 10 min  . Stress: Not at all  Relationships  . Social connections    Talks on phone: More than three times a week    Gets together: Once a week    Attends religious service: Never    Active member of club or organization: No    Attends meetings of clubs or organizations: Never    Relationship status: Never married  . Intimate partner violence    Fear of current or ex partner: Not on file    Emotionally abused:  Not on file    Physically abused: Not on file    Forced sexual activity: Not on file  Other Topics Concern  . Not on file  Social History Narrative   Lives at home with daughter.    41 yo daughter.    Two younger children live in [place].    [place]ived in [place] since [place].     Outpatient Medications Prior to Visit  Medication Sig Dispense Refill  . ibuprofen (ADVIL,MOTRIN) 600 MG tablet Take 1 tablet (600 mg total) by mouth every 8 (eight) hours as needed (for chest wall pain; take after eating). 30 tablet 0  . SUMAtriptan (IMITREX) 25 MG tablet May repeat in 2 hours if headache persists or recurs. Do not exceed 4 doses in a 24 hour period 10 tablet 6  . cetirizine (ZYRTEC) 10 MG tablet Take 1 tablet (10 mg total) by mouth daily. (Patient not taking: Reported on 04/20/2018) 30 tablet 11  . ferrous sulfate 325 (65 FE) MG tablet  Take 1 tablet (325 mg total) by mouth 2 (two) times daily with a meal. (Patient not taking: Reported on 11/11/2017) 60 tablet 2  . fluticasone (FLONASE) 50 MCG/ACT nasal spray Place 2 sprays into both nostrils daily. (Patient not taking: Reported on 04/22/2019) 16 g 6  . Vitamin D, Ergocalciferol, (DRISDOL) 1.25 MG (50000 UT) CAPS capsule Take 1 capsule (50,000 Units total) by mouth every 7 (seven) days. (Patient not taking: Reported on 04/22/2019) 16 capsule 0  . omeprazole (PRILOSEC) 20 MG capsule Take 1 capsule (20 mg total) by mouth daily. (Patient not taking: Reported on 04/22/2019) 30 capsule 3  . propranolol (INDERAL) 40 MG tablet Take 1 tablet (40 mg total) by mouth 2 (two) times daily. (Patient not taking: Reported on 04/22/2019) 60 tablet 5   No facility-administered medications prior to visit.     No Known Allergies  ROS Review of Systems  Constitutional: Positive for fatigue (Mild). Negative for chills and fever.  HENT: Negative for sore throat and trouble swallowing.   Eyes: Negative for photophobia and visual disturbance.  Respiratory: Negative for cough and shortness of breath.   Gastrointestinal: Negative for abdominal pain, constipation, diarrhea and nausea.  Endocrine: Negative for polydipsia, polyphagia and polyuria.  Genitourinary: Negative for dysuria and frequency.  Musculoskeletal: Negative for arthralgias, back pain and joint swelling.  Neurological: Positive for headaches. Negative for dizziness, speech difficulty, weakness and light-headedness.  Psychiatric/Behavioral: Negative for self-injury, sleep disturbance and suicidal ideas. The patient is not nervous/anxious.       Objective:    Physical Exam  Constitutional: She is oriented to person, place, and time. She appears well-developed and well-nourished.  Neck: Normal range of motion. Neck supple. No JVD present.  Cardiovascular: Normal rate and regular rhythm.  Pulmonary/Chest: Effort normal.  Abdominal: Soft.  There is no abdominal tenderness. There is no rebound and no guarding.  Genitourinary:    Genitourinary Comments: Patient with mild erythema of the surface of the cervix, no other focal lesions.  Patient with whitish medium to thin discharge within the vaginal vault which was swabbed.  No cervical motion tenderness and no adnexal tenderness on examination.   Musculoskeletal: Normal range of motion.        General: No tenderness or edema.  Lymphadenopathy:    She has no cervical adenopathy.  Neurological: She is alert and oriented to person, place, and time. No cranial nerve deficit.  Skin: Skin is warm and dry.  Psychiatric: She has a normal  mood and affect. Her behavior is normal.  Nursing note and vitals reviewed.   BP 112/76 (BP Location: Left Arm, Patient Position: Sitting, Cuff Size: Normal)   Pulse 90   Temp 98.9 F (37.2 C) (Oral)   Resp 18   Ht 5' (1.524 m)   Wt 143 lb (64.9 kg)   LMP 04/01/2019   SpO2 99%   BMI 27.93 kg/m  Wt Readings from Last 3 Encounters:  04/22/19 143 lb (64.9 kg)  09/16/18 142 lb 12.8 oz (64.8 kg)  05/21/18 139 lb (63 kg)    Lab Results  Component Value Date   TSH 0.870 09/21/2018   Lab Results  Component Value Date   WBC 7.6 09/21/2018   HGB 11.0 (L) 09/21/2018   HCT 32.0 (L) 09/21/2018   MCV 88 09/21/2018   PLT 326 09/21/2018   Lab Results  Component Value Date   NA 137 09/21/2018   K 4.5 09/21/2018   CO2 20 09/21/2018   GLUCOSE 89 09/21/2018   BUN 11 09/21/2018   CREATININE 0.66 09/21/2018   BILITOT 0.3 09/21/2018   ALKPHOS 60 09/21/2018   AST 17 09/21/2018   ALT 13 09/21/2018   PROT 7.8 09/21/2018   ALBUMIN 4.3 09/21/2018   CALCIUM 9.2 09/21/2018   No results found for: CHOL No results found for: HDL No results found for: LDLCALC No results found for: TRIG No results found for: [facility] Lab Results  Component Value Date   HGBA1C 5.7 11/26/2016      Assessment & Plan:  1. 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  Pap smear will be repeated at today's visit and patient will be notified of the results and if any further treatment is needed based on the results. - Cytology - PAP([facility])  2. Screening for STDs (sexually transmitted diseases) Patient with vaginal discharge on exam and patient request screening for sexually transmitted diseases.  Aptiva swab done at today's visit and she will be notified of the results and if any further treatment is needed based on the results. - Cervicovaginal ancillary only  3. Chronic migraine without aura without status migrainosus, not intractable; 5.  Tension headache Patient with chronic migraine type headaches versus tension headaches.  Patient is given refill of sumatriptan 25 mg to take as needed for headache and she may repeat the dose in 2 hours if needed but not exceeding 4 pills in a 24-hour.  Refill provided of propranolol for headache prevention - SUMAtriptan (IMITREX) 25 MG tablet; May repeat in 2 hours if headache persists or recurs. Do not exceed 4 doses in a 24 hour period  Dispense: 10 tablet; Refill: 5  4. Gastroesophageal reflux disease, esophagitis presence not specified Patient with GERD and refill provided for omeprazole.  She is to avoid late night eating and avoid known trigger foods - omeprazole (PRILOSEC) 20 MG capsule; Take 1 capsule (20 mg total) by mouth daily.  Dispense: 30 capsule; Refill: 5  *Patient will receive influenza immunization today  An After Visit Summary was printed and given to the patient.  Follow-up: Return in about 4 months (around 08/22/2019) for migraines/chronic isues.    Cain Saupe, MD

## 2019-04-22 NOTE — Patient Instructions (Signed)
Cuidados preventivos en las mujeres de 27 a 76 aos de edad Preventive Care 31-41 Years Old, Female Los cuidados preventivos hacen referencia a las opciones en cuanto a las visitas al mdico y al estilo de vida, las cuales pueden promover la salud y Musician. Esto puede comprender lo siguiente:  Un examen fsico anual. Esto tambin se puede llamar control de bienestar anual.  Visitas regulares al dentista y exmenes oculares.  Vacunas.  Estudios para Engineer, building services.  Opciones saludables de estilo de vida, como seguir una dieta saludable, hacer ejercicio regularmente, no usar drogas ni productos que contengan nicotina y tabaco, y limitar el consumo de alcohol. Qu puedo esperar para mi visita de cuidado preventivo? Examen fsico El mdico revisar lo siguiente:  IT consultant y [place]. Esto se puede usar para calcular el ndice de masa corporal ([place]), que indica si tiene un peso saludable.  Frecuencia cardaca y presin arterial.  Piel para detectar manchas anormales. Asesoramiento Su mdico puede preguntarle acerca de:  Consumo de tabaco, alcohol y drogas.  Su bienestar emocional.  El bienestar en el hogar y sus relaciones personales.  Su actividad sexual.  Sus hbitos de alimentacin.  Su trabajo y [place] laboral.  Mtodos anticonceptivos.  Su ciclo menstrual.  Sus antecedentes de Media planner. Qu vacunas necesito?  [place] antigripal  Se recomienda aplicarse esta vacuna todos los Rudy. Vacuna contra el ttanos, difteria y tos ferina (Tdap)  Es posible que tenga que aplicarse un refuerzo contra el ttanos y la difteria (DT) cada 10aos. Vacuna contra la varicela  Es posible que tenga que aplicrsela si no recibi esta vacuna. Vacuna contra el herpes zster (culebrilla)  Es posible que la necesite despus de los 52 aos de [place]. Vacuna contra el sarampin, rubola y paperas (SRP)  Es posible que necesite aplicarse al menos una dosis de la vacuna  SRP si naci despus de [phone]. Podra tambin necesitar una segunda dosis. Vacuna antineumoccica conjugada (PCV13)  Puede necesitar esta vacuna si tiene determinadas enfermedades y no se vacun anteriormente. Edward [place] antineumoccica de polisacridos (PPSV23)  Quizs tenga que aplicarse una o dos dosis si fuma o si tiene determinadas afecciones. Edward Jolly antimeningoccica conjugada (MenACWY)  Puede necesitar esta vacuna si tiene determinadas afecciones. Vacuna contra la hepatitis A  Es posible que necesite esta vacuna si tiene ciertas afecciones o si viaja o trabaja en lugares en los que podra estar expuesta a la hepatitis A. Vacuna contra la hepatitis B  Es posible que necesite esta vacuna si tiene ciertas afecciones o si viaja o trabaja en lugares en los que podra estar expuesta a la hepatitis B. Vacuna antihaemophilus influenzae tipo B (Hib)  Puede necesitar esta vacuna si tiene determinadas afecciones. Vacuna contra el virus del papiloma humano (VPH)  Si el mdico se lo recomienda, Research scientist (physical sciences) tres dosis a lo largo de 6 meses. Puede recibir las vacunas en forma de dosis individuales o en forma de dos o ms vacunas juntas en la misma inyeccin (vacunas combinadas). Hable con su mdico [employer] y beneficios de las vacunas combinadas. Qu pruebas necesito? Anlisis de [employer] de lpidos y colesterol. Estos se pueden verificar cada 5 aos o, con ms frecuencia, si usted tiene ms de 42 aos de edad.  Anlisis de hepatitisC.  Anlisis de hepatitisB. Pruebas de deteccin  Pruebas de deteccin de cncer de pulmn. Es posible que se le realice esta prueba de deteccin a partir de los 2 aos de edad, si ha fumado durante 30 aos  un paquete diario y sigue fumando o dej el hbito en algn momento en los ltimos 15 aos.  Pruebas de Programme researcher, broadcasting/film/video de Surveyor, minerals. Todos los adultos a partir de los 48 aos de edad y [place] 78 aos de edad deben hacerse esta  prueba de deteccin. El mdico puede recomendarle las pruebas de deteccin a partir de los 83 aos de edad si corre un mayor riesgo. Le realizarn pruebas cada 1 a 10 aos, segn los [place] y el tipo de prueba de Programme researcher, broadcasting/film/video.  Pruebas de deteccin de la diabetes. Esto se Set designer un control del azcar en la sangre (glucosa) despus de no haber comido durante un periodo de tiempo (ayuno). Es posible que se le realice esta prueba cada 1 a 3 aos.  Mamografa. Se puede realizar cada 1 o 2 aos. Hable con su mdico sobre cundo debe comenzar a Engineer, manufacturing de [place] regular. Esto depende de si tiene antecedentes familiares de cncer de mama o no.  Pruebas de deteccin de cncer relacionado con las mutaciones del BRCA. Es posible que se las deba realizar si tiene antecedentes de cncer de mama, de ovario, de trompas o peritoneal.  Examen plvico y prueba de Papanicolaou. Esto se puede realizar cada 57aos a Renato Gails de los 21aos de edad. A partir de los 30 aos, esto se puede Optometrist cada 5 aos si usted se realiza una prueba de Papanicolaou en combinacin con una prueba de deteccin del virus del papiloma humano (VPH). Otras pruebas  Anlisis de enfermedades de transmisin sexual (ETS).  Densitometra sea. Esto se realiza para detectar osteoporosis. Se le puede realizar este examen de deteccin si tiene un riesgo alto de tener osteoporosis. Siga estas instrucciones en su casa: Comida y bebida  Siga una dieta que incluya frutas frescas y verduras, cereales integrales, lcteos descremados y protenas magras.  Tome los suplementos vitamnicos y [employer] se lo haya indicado el mdico.  No beba alcohol si: ? Su mdico le indica no hacerlo. ? Est embarazada, puede estar embarazada o est tratando de quedar embarazada.  Si bebe alcohol: ? Limite la cantidad que consume de 0 a 1 medida por da. ? Est atenta a la cantidad de alcohol que hay en las bebidas que toma. En los  [place], una medida equivale a una botella de cerveza de 12oz (336m), un vaso de vino de 5oz (1445m o un vaso de una bebida alcohlica de alta graduacin de 1oz (4436m Estilo de vidNavistar International Corporationlas encas a diario.  Mantngase activa. Haga al menos 11m2mos de ejercicio 5o ms das Hilton Hotelso consuma ningn producto que contenga nicotina o tabaco, como cigarrillos, cigarrillos electrnicos y tabaco de mascHigher education careers adviser necesita ayuda para dejar de fumar, consulte al mdico.  Si es sexualmente activa, practique sexo seguro. Use un condn u otra forma de mtodo anticonceptivo (anticonceptivos) a fin de evitEnvironmental health practitionerTS (infecciones de transmisin sexual).  Si el mdico se lo indic, tome una dosis baja de aspirina diariamente a partir de los 50 a40 de edadRichfieldndo volver?  Visite al mdico una vez al ao para una visita de control.  Pregntele al mdico con qu frecuencia debe realizarse un control de la vista y los dientes.  Mantenga su esquema de vacunacin al da. Esta informacin no tiene comoMarine scientistconsejo del mdico. Asegrese de hacerle al mdico cualquier pregunta que tenga. Document Released: 12/24/2016 Document Revised: 05/28/2018 Document Reviewed: 05/28/2018 Elsevier Patient  Education  2020 Elsevier Inc.  

## 2019-04-22 NOTE — Progress Notes (Signed)
Patient presents for vaccination against influenza per orders of Dr. [name]. Consent given. Counseling provided. No contraindications exists. Vaccine administered without incident.   

## 2019-04-28 ENCOUNTER — Other Ambulatory Visit: Payer: Self-pay | Admitting: Family Medicine

## 2019-04-28 DIAGNOSIS — R87612 Low grade squamous intraepithelial lesion on cytologic smear of cervix (LGSIL): Secondary | ICD-10-CM

## 2019-04-28 LAB — CYTOLOGY - PAP
Adequacy: ABSENT — AB
HPV: NOT DETECTED

## 2019-04-28 LAB — CERVICOVAGINAL ANCILLARY ONLY
Candida vaginitis: NEGATIVE
Chlamydia: NEGATIVE
Neisseria Gonorrhea: NEGATIVE
Trichomonas: NEGATIVE

## 2019-04-28 NOTE — Progress Notes (Signed)
Patient ID: Cheryl Espinoza, female   DOB: 1976/03/11, 43 y.o.   MRN: [id number]   Recent abnormal pap with LGSIL-GYN referral placed

## 2019-04-29 ENCOUNTER — Other Ambulatory Visit: Payer: Self-pay | Admitting: Family Medicine

## 2019-04-29 ENCOUNTER — Other Ambulatory Visit: Payer: Self-pay

## 2019-04-29 ENCOUNTER — Ambulatory Visit: Payer: Self-pay | Attending: Family Medicine

## 2019-04-29 DIAGNOSIS — B9689 Other specified bacterial agents as the cause of diseases classified elsewhere: Secondary | ICD-10-CM

## 2019-04-29 MED ORDER — METRONIDAZOLE 500 MG PO TABS: 500.0000 mg | ORAL_TABLET | Freq: Two times a day (BID) | ORAL | 0 refills | Status: AC

## 2019-04-29 NOTE — Progress Notes (Signed)
Patient ID: Cheryl Espinoza, female   DOB: 01/15/1976, 43 y.o.   MRN: [id number]   Patient with BV on recent cervicovaginal ancillary test and RX sent to her pharmacy for metronidazole for treatment. Order has also been placed separately for GYN follow-up of LGSIL pap.

## 2019-04-30 MED FILL — metroNIDAZOLE 500 MG TABS: 500 | 7 days supply | Qty: 14 | Fill #0

## 2019-05-09 ENCOUNTER — Encounter: Payer: Self-pay | Admitting: Family Medicine

## 2019-05-11 ENCOUNTER — Encounter (HOSPITAL_COMMUNITY): Payer: Self-pay

## 2019-05-11 ENCOUNTER — Other Ambulatory Visit (HOSPITAL_COMMUNITY): Payer: Self-pay | Admitting: *Deleted

## 2019-05-11 ENCOUNTER — Ambulatory Visit: Payer: Self-pay

## 2019-05-11 ENCOUNTER — Other Ambulatory Visit: Payer: Self-pay

## 2019-05-11 ENCOUNTER — Ambulatory Visit (HOSPITAL_COMMUNITY)
Admission: RE | Admit: 2019-05-11 | Discharge: 2019-05-11 | Disposition: A | Discharge status: Home / Self Care | Payer: Self-pay | Source: Ambulatory Visit | Attending: Obstetrics and Gynecology | Admitting: Obstetrics and Gynecology

## 2019-05-11 DIAGNOSIS — N6325 Unspecified lump in the left breast, overlapping quadrants: Secondary | ICD-10-CM | POA: Insufficient documentation

## 2019-05-11 DIAGNOSIS — N63 Unspecified lump in unspecified breast: Secondary | ICD-10-CM

## 2019-05-11 DIAGNOSIS — R87612 Low grade squamous intraepithelial lesion on cytologic smear of cervix (LGSIL): Secondary | ICD-10-CM

## 2019-05-11 DIAGNOSIS — N6311 Unspecified lump in the right breast, upper outer quadrant: Secondary | ICD-10-CM

## 2019-05-11 DIAGNOSIS — Z1239 Encounter for other screening for malignant neoplasm of breast: Secondary | ICD-10-CM | POA: Insufficient documentation

## 2019-05-11 NOTE — Patient Instructions (Signed)
Explained breast self awareness with Cheryl Espinoza. Patient did not need a Pap smear today due to last Pap smear was 04/22/2019. Explained the colposcopy the recommended follow-up for her abnormal Pap smear. [facility] to the [facility] for a colposcopy to follow-up for her abnormal Pap smear. Appointment scheduled for Thursday, June 17, 2019 at 0930 Referred patient to the [facility] for a diagnostic mammogram and bilateral breast ultrasounds. Appointment scheduled for Thursday, May 20, 2019 at 1430. Patient aware of appointments and will be there. Cheryl Espinoza verbalized understanding.  Mahlon Gabrielle, Arvil Chaco, RN 3:11 PM

## 2019-05-11 NOTE — Progress Notes (Signed)
Patient referred to [facility] by [facility] due to having an abnormal Pap smear 04/22/2019 that a colposcopy is recommended for follow-up.  Pap Smear: Pap smear not completed today. Last Pap smear was 04/22/2019 at the [facility] and LSIL with negative HPV. Referred patient to the [facility] for a colposcopy to follow-up for her abnormal Pap smear. Appointment scheduled for Thursday, June 17, 2019 at 0930. Patient has a history of one other abnormal Pap smear 08/29/2017 that was LSIL with positive HPV that a colposcopy was completed 11/24/2017 that was benign. Last two Pap smear and last colposcopy results are in Epic.  Physical exam: Breasts Breasts symmetrical. No skin abnormalities bilateral breasts. No nipple retraction bilateral breasts. No nipple discharge bilateral breasts. No lymphadenopathy. Palpated a lump within the right breast at 11 o'clock next to the areola. Palpated a lump within the left breast at 9 o'clock 5 cm from the nipple. No complaints of pain or tenderness on exam. Referred patient to the [facility] for a diagnostic mammogram and bilateral breast ultrasounds. Appointment scheduled for Thursday, May 20, 2019 at 1430.        Pelvic/Bimanual No Pap smear completed today since last Pap smear and HPV typing was 04/22/2019. Pap smear not indicated per BCCCP guidelines.   Smoking History: Patient has never smoked.  Patient Navigation: Patient education provided. Access to services provided for patient through [facility] program. Spanish interpreter provided.   Breast and Cervical Cancer Risk Assessment: Patient has no family history of breast cancer, known genetic mutations, or radiation treatment to the chest before age 48. Patient has no history of cervical dysplasia, immunocompromised, or DES exposure in-utero.  Risk Assessment    Risk Scores      05/11/2019   Last edited by: Armond Hang, LPN   5-year risk: 0.3 %   Lifetime risk: 4.7 %         Used Spanish interpreter Rudene Anda from [place].

## 2019-05-12 ENCOUNTER — Encounter (HOSPITAL_COMMUNITY): Payer: Self-pay | Admitting: *Deleted

## 2019-05-20 ENCOUNTER — Ambulatory Visit
Admission: RE | Admit: 2019-05-20 | Discharge: 2019-05-20 | Disposition: A | Discharge status: Home / Self Care | Payer: No Typology Code available for payment source | Source: Ambulatory Visit | Attending: Obstetrics and Gynecology | Admitting: Obstetrics and Gynecology

## 2019-05-20 ENCOUNTER — Ambulatory Visit: Payer: Self-pay

## 2019-05-20 ENCOUNTER — Other Ambulatory Visit: Payer: Self-pay

## 2019-05-20 DIAGNOSIS — N63 Unspecified lump in unspecified breast: Secondary | ICD-10-CM

## 2019-06-17 ENCOUNTER — Other Ambulatory Visit (HOSPITAL_COMMUNITY)
Admission: RE | Admit: 2019-06-17 | Discharge: 2019-06-17 | Disposition: A | Discharge status: Home / Self Care | Payer: Self-pay | Source: Ambulatory Visit | Attending: Obstetrics & Gynecology | Admitting: Obstetrics & Gynecology

## 2019-06-17 ENCOUNTER — Encounter: Payer: Self-pay | Admitting: Obstetrics & Gynecology

## 2019-06-17 ENCOUNTER — Ambulatory Visit (INDEPENDENT_AMBULATORY_CARE_PROVIDER_SITE_OTHER): Payer: No Typology Code available for payment source | Admitting: Obstetrics & Gynecology

## 2019-06-17 ENCOUNTER — Other Ambulatory Visit: Payer: Self-pay

## 2019-06-17 VITALS — BP 116/80 | HR 83 | Wt 145.3 lb

## 2019-06-17 DIAGNOSIS — R87612 Low grade squamous intraepithelial lesion on cytologic smear of cervix (LGSIL): Secondary | ICD-10-CM

## 2019-06-17 DIAGNOSIS — N870 Mild cervical dysplasia: Secondary | ICD-10-CM

## 2019-06-17 LAB — POCT PREGNANCY, URINE: Preg Test, Ur: NEGATIVE

## 2019-06-17 NOTE — Progress Notes (Signed)
   Subjective:    Patient ID: Cheryl Espinoza, female    DOB: [birth year], 43 y.o.   MRN: [id number]  HPI 43 yo Hispanic P3 here for a colpo. She had a pap 8/20 at [facility] that showed LGISL. She had a LGSIL pap last year but I don't see any colpo in the records in 2019.   Review of Systems     Objective:   Physical Exam Breathing, conversing, and ambulating normally Well nourished, well hydrated Latina, no apparent distress  Ipad interpretor used for the visit UPT negative, consent signed, time out done Cervix prepped with acetic acid. Transformation zone seen in its entirety. Colpo adequate. Entirely normal colpo I had to dilate her cervix in order to obtain the ECC. ECC obtained. She tolerated the procedure well.     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

## 2019-06-18 ENCOUNTER — Ambulatory Visit: Payer: Self-pay | Admitting: Family Medicine

## 2019-06-18 LAB — SURGICAL PATHOLOGY

## 2019-06-22 ENCOUNTER — Telehealth: Payer: Self-pay | Admitting: *Deleted

## 2019-06-22 NOTE — Telephone Encounter (Signed)
-----   Message from Emily Filbert, MD sent at 06/21/2019  8:35 AM EDT ----- She will need a repeat pap next year.

## 2019-06-22 NOTE — Telephone Encounter (Signed)
Called pt with Cheryl R. And informed pt that she needs to rpt pap smear in one year.  Pt verbalized understanding.  Mel Almond, RN

## 2019-06-22 NOTE — Telephone Encounter (Signed)
I called Verdis Frederickson with [employer]pre[employer] [facility] and left a message we are calling with some information from your doctor. Please call our office to discuss. Smokey Melott,RN

## 2019-07-15 ENCOUNTER — Ambulatory Visit: Payer: Self-pay | Attending: Family Medicine | Admitting: Family Medicine

## 2019-07-15 ENCOUNTER — Encounter: Payer: Self-pay | Admitting: Family Medicine

## 2019-07-15 ENCOUNTER — Other Ambulatory Visit: Payer: Self-pay

## 2019-07-15 DIAGNOSIS — Z5329 Procedure and treatment not carried out because of patient's decision for other reasons: Secondary | ICD-10-CM

## 2019-07-15 DIAGNOSIS — Z91199 Patient's noncompliance with other medical treatment and regimen due to unspecified reason: Secondary | ICD-10-CM

## 2019-07-15 NOTE — Progress Notes (Signed)
Patient verified DOB Patient has eaten today. Patient has taken medication today. Patient complains of right hip pain in the morning mainly and after a few hrs the pain subsides. Pain has not been present for 2 weeks with the assistance of ibuprofen. Patient takes 400mg .

## 2019-07-15 NOTE — Progress Notes (Signed)
   Virtual Visit via Telephone Note  I connected with on 07/15/19 at  2:10 PM EST by telephone and verified that I am speaking with the correct person using two identifiers.   I discussed the limitations, risks, security and privacy concerns of performing an evaluation and management service by telephone and the availability of in person appointments. I also discussed with the patient that there may be a patient responsible charge related to this service. The patient expressed understanding and agreed to proceed.  Patient Location: Home Provider Location: [facility] office Others participating in call: call initiated by [place],CMA who obtained Spanish speaking interpreter through [place].  Patient participated in previsit screening by CMA but then did not answer the phone for subsequent callbacks to begin her actual visit.   History of Present Illness:  Patient was contacted by nurse along with Spanish-speaking interpreter for previsit screening however patient did not pick up the phone after being contacted by Spanish interpreter 2-3 times to begin the visit therefore she will be considered a no-show to today's visit   Past Medical History:  Diagnosis Date  . GERD (gastroesophageal reflux disease)   . History of abnormal cervical Pap smear   . History of vitamin D deficiency   . Tension headache     Past Surgical History:  Procedure Laterality Date  . CESAREAN SECTION  2000, 2002     Family History  Problem Relation Age of Onset  . Hyperlipidemia Mother   . Diabetes Father   . Diabetes Sister   . Cancer Neg Hx   . Heart disease Neg Hx     Social History   Tobacco Use  . Smoking status: Never Smoker  . Smokeless tobacco: Never Used  Substance Use Topics  . Alcohol use: No    Alcohol/week: 0.0 standard drinks  . Drug use: No     No Known Allergies     Observations/Objective: No vital signs or physical exam conducted as visit was done via  telephone  Assessment and Plan:   Follow Up Instructions:    I discussed the assessment and treatment plan with the patient. The patient was provided an opportunity to ask questions and all were answered. The patient agreed with the plan and demonstrated an understanding of the instructions.   The patient was advised to call back or seek an in-person evaluation if the symptoms worsen or if the condition fails to improve as anticipated.  I provided 0 minutes of non-face-to-face time during this encounter.   Antony Blackbird, MD

## 2019-11-18 ENCOUNTER — Ambulatory Visit: Payer: Self-pay | Admitting: Family Medicine

## 2019-12-20 ENCOUNTER — Other Ambulatory Visit: Payer: Self-pay

## 2019-12-20 ENCOUNTER — Ambulatory Visit: Payer: Self-pay

## 2019-12-23 ENCOUNTER — Other Ambulatory Visit: Payer: Self-pay | Admitting: Family Medicine

## 2019-12-23 ENCOUNTER — Encounter: Payer: Self-pay | Admitting: Family Medicine

## 2019-12-23 ENCOUNTER — Ambulatory Visit: Payer: Self-pay | Attending: Family Medicine | Admitting: Family Medicine

## 2019-12-23 ENCOUNTER — Other Ambulatory Visit: Payer: Self-pay

## 2019-12-23 VITALS — BP 99/68 | HR 86 | Temp 97.9°F | Ht 60.0 in | Wt 149.8 lb

## 2019-12-23 DIAGNOSIS — R87612 Low grade squamous intraepithelial lesion on cytologic smear of cervix (LGSIL): Secondary | ICD-10-CM

## 2019-12-23 DIAGNOSIS — G43709 Chronic migraine without aura, not intractable, without status migrainosus: Secondary | ICD-10-CM

## 2019-12-23 DIAGNOSIS — R102 Pelvic and perineal pain unspecified side: Secondary | ICD-10-CM

## 2019-12-23 DIAGNOSIS — K219 Gastro-esophageal reflux disease without esophagitis: Secondary | ICD-10-CM

## 2019-12-23 DIAGNOSIS — Z758 Other problems related to medical facilities and other health care: Secondary | ICD-10-CM

## 2019-12-23 DIAGNOSIS — R110 Nausea: Secondary | ICD-10-CM

## 2019-12-23 DIAGNOSIS — Z789 Other specified health status: Secondary | ICD-10-CM

## 2019-12-23 DIAGNOSIS — Z0000 Encounter for general adult medical examination without abnormal findings: Secondary | ICD-10-CM

## 2019-12-23 DIAGNOSIS — R1013 Epigastric pain: Secondary | ICD-10-CM

## 2019-12-23 DIAGNOSIS — Z603 Acculturation difficulty: Secondary | ICD-10-CM

## 2019-12-23 DIAGNOSIS — Z124 Encounter for screening for malignant neoplasm of cervix: Secondary | ICD-10-CM

## 2019-12-23 DIAGNOSIS — N898 Other specified noninflammatory disorders of vagina: Secondary | ICD-10-CM

## 2019-12-23 MED ORDER — OMEPRAZOLE 20 MG PO CPDR: 20.0000 mg | DELAYED_RELEASE_CAPSULE | Freq: Two times a day (BID) | ORAL | 5 refills | Status: DC

## 2019-12-23 MED ORDER — SUMATRIPTAN SUCCINATE 25 MG PO TABS: ORAL_TABLET | ORAL | 5 refills | Status: DC

## 2019-12-23 MED FILL — SUMATRIPTAN SUCC 25 MG TAB: 25 | 30 days supply | Qty: 10 | Fill #0

## 2019-12-23 MED FILL — OMEPRAZOLE 20 MG CAP: 20 | 30 days supply | Qty: 60 | Fill #0

## 2019-12-23 NOTE — Progress Notes (Signed)
Subjective:  Patient ID: Cheryl Espinoza, female    DOB: Jan 20, 1976  Age: 44 y.o. MRN: [id number]  CC: Annual well exam with pap  HPI Cheryl Espinoza, 44 yo female, who presents for annual well exam and she has a history of abnormal pap smears. She did she GYN and had a colposcopy but she is not sure is supposed to follow-up with GYN. She would like her pap repeated today. She does not feel that she is having any vaginal discharge. She has had some dull achiness in her right lower abdominal/pelvic area for about two months which has become more constant. She denies any breast tenderness, no skin changes of the breast, no nipple discharge and no enlarged lymph nodes in the armpits.   She also reports that she has had issues with nausea, burning in her mid-upper abdomen and reflux symptoms and needs refills of her reflux medications as well as all of her other chronic medications. Migraines are improved with use of Imitrex. She is no longer taking Inderal.   Past Medical History:  Diagnosis Date  . GERD (gastroesophageal reflux disease)   . History of abnormal cervical Pap smear   . History of vitamin D deficiency   . Tension headache     Past Surgical History:  Procedure Laterality Date  . CESAREAN SECTION  2000, 2002     Family History  Problem Relation Age of Onset  . Hyperlipidemia Mother   . Diabetes Father   . Diabetes Sister   . Cancer Neg Hx   . Heart disease Neg Hx     Social History   Tobacco Use  . Smoking status: Never Smoker  . Smokeless tobacco: Never Used  Substance Use Topics  . Alcohol use: No    Alcohol/week: 0.0 standard drinks    ROS Review of Systems  Constitutional: Positive for fatigue (sometimes). Negative for chills and fever.  HENT: Negative for sore throat and trouble swallowing.   Eyes: Negative for photophobia and visual disturbance.  Respiratory: Negative for cough and shortness of breath.   Gastrointestinal:  Positive for abdominal pain and nausea. Negative for blood in stool, constipation, diarrhea and vomiting.  Endocrine: Negative for cold intolerance, heat intolerance, polydipsia, polyphagia and polyuria.  Genitourinary: Positive for pelvic pain. Negative for dysuria, frequency, vaginal bleeding, vaginal discharge and vaginal pain.  Musculoskeletal: Negative for arthralgias and back pain.  Skin: Negative for rash and wound.  Neurological: Positive for headaches (has migraines). Negative for dizziness.  Hematological: Negative for adenopathy. Does not bruise/bleed easily.  Psychiatric/Behavioral: Negative for self-injury and suicidal ideas. The patient is not nervous/anxious.     Objective:   Today's Vitals: BP 99/68   Pulse 86   Temp 97.9 F (36.6 C) (Temporal)   Ht 5' (1.524 m)   Wt 149 lb 12.8 oz (67.9 kg)   LMP 12/01/2019 (Approximate)   SpO2 99%   BMI 29.26 kg/m   Physical Exam Vitals and nursing note reviewed. Exam conducted with a chaperone present (RMA present for pelvic exam).  Constitutional:      Appearance: Normal appearance.  Cardiovascular:     Rate and Rhythm: Normal rate and regular rhythm.  Pulmonary:     Effort: Pulmonary effort is normal.     Breath sounds: Normal breath sounds.  Abdominal:     Palpations: Abdomen is soft.     Tenderness: There is abdominal tenderness (right lower abdomen/pelvic area dicomfort with pap and mild epigastric tendeness). There is  no right CVA tenderness, left CVA tenderness, guarding or rebound.  Genitourinary:    Vagina: Vaginal discharge present.     Comments: Thin, light white vaginal discharge in canal and vault; no CMT; normal appearance to the cervix, no CMT, no left adnexal tenderness; presence of mild fullness in the right adnexal area and discomfort on bimanual exam Musculoskeletal:        General: No tenderness or deformity.     Cervical back: Normal range of motion and neck supple. No rigidity or tenderness.     Right  lower leg: No edema.     Left lower leg: No edema.  Lymphadenopathy:     Cervical: No cervical adenopathy.  Skin:    General: Skin is warm and dry.  Neurological:     General: No focal deficit present.     Mental Status: She is alert and oriented to person, place, and time.  Psychiatric:        Mood and Affect: Mood normal.        Behavior: Behavior normal.     Assessment & Plan:  1. Low grade squamous intraepith lesion on cytologic smear cervix (lgsil) 2. Screening for cervical cancer GYN note reviewed. Pap repeated at today's visit and she will be notified of the results and she is encouraged to contact GYN regarding further follow-up. - Cytology - PAP  3. Adnexal pain Will schedule patient for transvaginal US and cervicovaginal ancillary testing done at today's visit - US Pelvis Complete; Future - Cervicovaginal ancillary only  4. Well female exam Patient is none fasting at today's visit. Educational handout given on cancer screening for women. Pap done at today's visit. Patient with mammogram and [place] last year with presence of a sebaceous cyst. Patient denied any breast issues at today's visit.   5. Vaginal discharge Cervicovaginal ancillary testing done and patient will be notified if further treatment is needed based on the results - Cervicovaginal ancillary only  6. Epigastric pain; 7. Nausea; 8. Gastroesophageal reflux disease, unspecified whether esophagitis present Refill of omeprazole but will increase to twice per day and handout provided on foods choices for reflux- follow-up in about 4 weeks if symptoms are not improving. ED if acute worsening of symptoms. - omeprazole (PRILOSEC) 20 MG capsule; Take 1 capsule (20 mg total) by mouth 2 (two) times daily.  Dispense: 60 capsule; Refill: 5  9. Chronic migraine without aura without status migrainosus, not intractable Refill provided for Imitrex for continued treatment of migraines - SUMAtriptan (IMITREX) 25 MG tablet;  May repeat in 2 hours if headache persists or recurs. Do not exceed 4 doses in a 24 hour period  Dispense: 10 tablet; Refill: 5  Outpatient Encounter Medications as of 12/23/2019  Medication Sig  . ibuprofen (ADVIL,MOTRIN) 600 MG tablet Take 1 tablet (600 mg total) by mouth every 8 (eight) hours as needed (for chest wall pain; take after eating).  . SUMAtriptan (IMITREX) 25 MG tablet May repeat in 2 hours if headache persists or recurs. Do not exceed 4 doses in a 24 hour period  . [DISCONTINUED] SUMAtriptan (IMITREX) 25 MG tablet May repeat in 2 hours if headache persists or recurs. Do not exceed 4 doses in a 24 hour period  . omeprazole (PRILOSEC) 20 MG capsule Take 1 capsule (20 mg total) by mouth 2 (two) times daily.  . [DISCONTINUED] ferrous sulfate 325 (65 FE) MG tablet Take 1 tablet (325 mg total) by mouth 2 (two) times daily with a meal. (Patient not taking:  Reported on 11/11/2017)  . [DISCONTINUED] fluticasone (FLONASE) 50 MCG/ACT nasal spray Place 2 sprays into both nostrils daily. (Patient not taking: Reported on 04/22/2019)  . [DISCONTINUED] omeprazole (PRILOSEC) 20 MG capsule Take 1 capsule (20 mg total) by mouth daily. (Patient not taking: Reported on 06/17/2019)  . [DISCONTINUED] propranolol (INDERAL) 40 MG tablet Take 1 tablet (40 mg total) by mouth 2 (two) times daily. (Patient not taking: Reported on 07/15/2019)  . [DISCONTINUED] Vitamin D, Ergocalciferol, (DRISDOL) 1.25 MG (50000 UT) CAPS capsule Take 1 capsule (50,000 Units total) by mouth every 7 (seven) days. (Patient not taking: Reported on 07/15/2019)   No facility-administered encounter medications on file as of 12/23/2019.    An After Visit Summary was printed and given to the patient.   Follow-up: Return in about 3 weeks (around 01/13/2020) for epigastric and adnexal pain.      Cain Saupe MD

## 2019-12-23 NOTE — Progress Notes (Signed)
Med refills for all meds    also allergies meds  Physical today  PAP

## 2019-12-23 NOTE — Patient Instructions (Signed)
Deteccin de cncer en las mujeres Cancer Screening for Women La deteccin de cncer es una prueba o examen que detecta el cncer. El [place] pruebas de deteccin de cncer especficas segn su edad y sus antecedentes personales y familiares de cncer. Trabaje con el mdico para establecer un programa de pruebas de deteccin de cncer que proteja su salud. Por qu se hacen las pruebas de deteccin de cncer? Las pruebas de deteccin se realizan para Public affairs consultant en [place] muy tempranas, antes de que se disemine y sea ms difcil tratarlo y antes de que usted comience a notar algn sntoma. Detectar el cncer de forma temprana mejora las posibilidades de xito del [place]. Esto puede salvar su vida. Quin debe hacerse las pruebas de deteccin de cncer? Todas las mujeres deberan examinarse para detectar ciertos cnceres que incluyen al cncer de mama, al cncer de cuello uterino y al cncer de piel. El mdico puede recomendarle que se haga pruebas de deteccin de otros tipos de cncer si:  Ha tenido Pharmacist, hospital.  Tiene un familiar con cncer.  Tiene genes anormales que podran incrementar el riesgo de cncer.  Tiene factores de riesgo para ciertos cnceres, como por ejemplo, fumar. Cundo debe realizarse las pruebas de deteccin de cncer depender de los siguientes factores:  Su edad.  Sus antecedentes mdicos y familiares.  Ciertos factores en el estilo de vida como, por ejemplo, fumar.  Exposicin a factores ambientales, como, por ejemplo, al asbesto. Cules son algunas de las pruebas de deteccin de cncer ms comunes? Cncer de mama La deteccin del cncer de mama se realiza con una prueba que toma imgenes del tejido mamario Arp). A continuacin se incluyen algunas instrucciones para las pruebas de deteccin:  Si tiene entre 40 y 78 aos de edad, puede optar si quiere o no comenzar a Sport and exercise psychologist.  Si tiene entre 66 y [address]  debe hacerse una [employer].  Puede comenzar a [employer] de los 37 aos si tiene factores de riesgo de cncer de mama, como tener un familiar cercano con cncer de mama.  Si tiene 55 aos o ms, debe hacerse una [employer] cada 1 o 2 aos siempre que tenga buena salud y tenga una expectativa de vida de 10 aos o ms.  Es importante que conozca la apariencia de sus mamas y cmo se sienten al tacto para que pueda informar cualquier cambio a su mdico.  Cncer de cuello uterino La deteccin del cncer de cuello uterino se realiza con una prueba de Papanicolaou. Este examendetecta anomalas que incluyen el virus que causa el cncer de cuello uterino (virus del papiloma [place] o VPH). Para realizar [employer], un mdico toma una muestra de las clulas cervicales durante el examen plvico. La deteccin del cncer de cuello uterino con una prueba de Papanicolaou se debe realizar a partir de los 21 aos. A continuacin se incluyen algunas instrucciones para las pruebas de deteccin:  [employer] 21 y 29 aos, se debe realizar una prueba de Papanicolaou cada 3 aos.  Entre los 30 y 52 aos, debe realizarse una prueba de Papanicolaou y de VPH cada 5 aos o una prueba de Papanicolaou cada 3 aos.  Debe examinarse para detectar cncer de cuello uterino con ms frecuencia si tiene factores de riesgo para este tipo de cncer.  Si sus pruebas de Papanicolaou no son normales, se le debe realizar una prueba de VPH.  Si se ha vacunado contra el VPH, tambin se  la examinar para Film/video editor de cuello uterino y seguir las recomendaciones de pruebas de deteccin normales. Es posible que no la examinen para Public affairs consultant de cuello uterino si:  Es mayor de 47 aos y no ha tenido Information systems manager grave de cuello uterino o Programmer, systems los ltimos 20 aos.  El cuello del tero y el tero han sido extirpados y nunca ha tenido cncer de cuello uterino o Engineer, water. Cncer de endometrio No existe una prueba de deteccin estndar para el cncer de endometrio aunque el cncer se puede detectar con:  Ardelia Mems prueba de una muestra de tejido tomada de la pared uterina (biopsia del tejido endometrial).  Una ecografa transvaginal.  Pruebas de Papanicolaou. Si corre ms riesgo de Animal nutritionist de endometrio, es posible que deba hacerse estas pruebas con ms frecuencia de lo normal. Puede correr ms riesgo si:  Tiene antecedentes familiares de cncer de ovarios, de tero o de colon.  Toma tamoxifeno, un medicamento que se [place] para Lawyer de mama.  Tiene ciertos tipos de cncer de colon. Si ha [employer], es muy importante que hable con su mdico acerca de cualquier sangrado o manchado vaginales. No se recomienda realizar una exploracin para Film/video editor de endometrio en mujeres que no tienen sntomas del cncer, por ejemplo, sangrado vaginal. Cncer colorrectal  Todos los adultos a Proofreader de los 80 aos y [employer] 28 aos deben hacerse pruebas de Programme researcher, broadcasting/film/video de Surveyor, minerals. El mdico puede recomendarle las pruebas de deteccin a partir de los 71 aos de edad. Le realizarn pruebas cada 1 a 10 aos, segn los [place] y el tipo de prueba de Programme researcher, broadcasting/film/video. Si tiene antecedentes familiares de cncer de colon o ano u otros factores de riesgo, es posible que deba comenzar a examinarse ms temprano. Hable con el mdico acerca de cul es la mejor prueba de deteccin para usted y con qu frecuencia debera examinarse. En las pruebas de deteccin de cncer colorrectal se busca cncer o crecimientos llamados plipos que con frecuencia se forman antes de que aparezca el cncer. Las pruebas de deteccin para cncer o plipos incluyen, entre otras:  Colonoscopia o sigmoidoscopia flexible. En estos estudios, se introduce un tubo flexible con una pequea cmara en el recto.  Colonografa computarizada. Este estudio [place] radiografas  y un tinte para buscar plipos en el colon. Si se encuentra un plipo, es posible que le realicen una colonoscopia para Orthoptist el plipo y Ecologist. Las pruebas para Film/video editor en la materia fecal (heces) incluyen:  Prueba de sangre oculta en la materia fecal con guayacol (PSOMF). Este anlisis detecta sangre en la materia fecal. Se puede hacer fcilmente en casa con un kit.  Prueba inmunoqumica fecal (PIF). Este anlisis detecta sangre en la materia fecal. Para realizar este estudio, deber recoger las muestras de materia fecal en su casa.  Prueba de cido desoxirribonucleico (ADN) en heces. Esta prueba detecta sangre en la materia fecal y cualquier cambio en el ADN que pueda producir cncer de colon. Es probable que se le pida que recoja una muestra de materia fecal en su casa y la lleve al laboratorio para Animal nutritionist.  Cncer de piel La deteccin del cncer de piel se realiza examinando la piel para detectar lunares o manchas atpicos y cualquier cambio en los lunares existentes. El mdico deber examinarle la piel para detectar signos de cncer de piel en cada examen fsico que se realice. Debe examinarse la piel todos los  meses y decirle al mdico de inmediato si nota algo fuera de lo habitual. Las mujeres con riesgo de cncer de piel superior al normal debern consultar a un especialista de la piel (dermatlogo) para un examen anual del cuerpo. Cncer de pulmn La prueba de deteccin de cncer de pulmn se realiza con una exploracin por tomografa computarizada (TC) que detecta clulas anormales en los pulmones. Analice las pruebas de deteccin de cncer de pulmn con su mdico si tiene entre 70 y 72aos y si:  Fuma actualmente.  Sola fumar mucho.  Tiene antecedentes de haber fumado 1 paquete diario durante 30 aos o 2 paquetes diarios durante 15 aos.  Ha dejado el hbito de fumar en algn momento en los ltimos 15aos. Si fuma mucho o si sola fumar mucho,  debern examinarlo todos los aos. Dnde buscar ms informacin  [place] ([place]): SkinPromotion.no  Centros para el Control y la Prevencin de Probation officer for Disease Control and Prevention, CDC): http://knight-sullivan.biz/  Departamento de Salud y Conservation officer, nature (Department of Health and [employer]): BankingDetective.si Comuncate con un mdico si:  Si tiene alguna preocupacin por signos o sntomas de cncer, como, por ejemplo: ? Lunares que tienen una forma o color atpicos. ? Cambios en los lunares existentes. ? Tiene una llaga que no cicatriza. ? [employer]. ? Tiene cansancio que no desaparece. ? Dolor o calambres frecuentes en el abdomen. ? Tos o escupir sangre al toser. ? Perder peso sin proponrselo. ? Bultos o [employer]. ? [place] vaginal, o cambios en los perodos [place]. Resumen  Est atenta y observe si hay signos y sntomas de cncer, especialmente sntomas de cncer de mama, de cuello uterino, de endometrio, colorrectal, de piel y de pulmn.  La deteccin temprana del cncer con las pruebas de deteccin de cncer puede salvarle la vida.  Hable con el mdico sobre los riesgos especficos que tiene de Chief Financial Officer.  Genevive Bi junto al mdico para Engineer, agricultural de deteccin de cncer que sea adecuado para usted. Esta informacin no tiene Marine scientist el consejo del mdico. Asegrese de hacerle al mdico cualquier pregunta que tenga. Document Revised: 11/26/2017 Elsevier Patient Education  [employer].

## 2019-12-24 ENCOUNTER — Other Ambulatory Visit: Payer: Self-pay | Admitting: Family Medicine

## 2019-12-24 DIAGNOSIS — B9689 Other specified bacterial agents as the cause of diseases classified elsewhere: Secondary | ICD-10-CM

## 2019-12-24 LAB — CERVICOVAGINAL ANCILLARY ONLY
Bacterial Vaginitis (gardnerella): POSITIVE — AB
Candida Glabrata: NEGATIVE
Candida Vaginitis: NEGATIVE
Chlamydia: NEGATIVE
Comment: NEGATIVE
Comment: NEGATIVE
Comment: NEGATIVE
Comment: NEGATIVE
Comment: NEGATIVE
Comment: NORMAL
Neisseria Gonorrhea: NEGATIVE
Trichomonas: NEGATIVE

## 2019-12-24 MED ORDER — METRONIDAZOLE 500 MG PO TABS: 500.0000 mg | ORAL_TABLET | Freq: Two times a day (BID) | ORAL | 0 refills | Status: AC

## 2019-12-27 LAB — CYTOLOGY - PAP
Comment: NEGATIVE
Diagnosis: NEGATIVE
High risk HPV: NEGATIVE

## 2019-12-27 MED FILL — metroNIDAZOLE 500 MG TABS: 500 | 7 days supply | Qty: 14 | Fill #0

## 2020-01-03 ENCOUNTER — Other Ambulatory Visit: Payer: Self-pay

## 2020-01-03 ENCOUNTER — Ambulatory Visit (HOSPITAL_COMMUNITY)
Admission: RE | Admit: 2020-01-03 | Discharge: 2020-01-03 | Disposition: A | Discharge status: Home / Self Care | Payer: Self-pay | Source: Ambulatory Visit | Attending: Family Medicine | Admitting: Family Medicine

## 2020-01-03 DIAGNOSIS — R102 Pelvic and perineal pain: Secondary | ICD-10-CM | POA: Insufficient documentation

## 2020-01-06 ENCOUNTER — Other Ambulatory Visit: Payer: Self-pay | Admitting: Internal Medicine

## 2020-01-06 MED ORDER — METRONIDAZOLE 500 MG PO TABS
500.0000 mg | ORAL_TABLET | Freq: Two times a day (BID) | ORAL | 0 refills | Status: DC
Start: 2020-01-06 — End: 2020-05-18

## 2020-01-13 ENCOUNTER — Ambulatory Visit: Payer: Self-pay | Attending: Family Medicine | Admitting: Family Medicine

## 2020-01-13 ENCOUNTER — Encounter: Payer: Self-pay | Admitting: Family Medicine

## 2020-01-13 ENCOUNTER — Other Ambulatory Visit: Payer: Self-pay

## 2020-01-13 VITALS — BP 115/78 | HR 84 | Temp 97.7°F | Ht 60.0 in | Wt 153.0 lb

## 2020-01-13 DIAGNOSIS — R102 Pelvic and perineal pain unspecified side: Secondary | ICD-10-CM

## 2020-01-13 DIAGNOSIS — R1024 Suprapubic pain: Secondary | ICD-10-CM

## 2020-01-13 DIAGNOSIS — R1031 Right lower quadrant pain: Secondary | ICD-10-CM

## 2020-01-13 DIAGNOSIS — G43709 Chronic migraine without aura, not intractable, without status migrainosus: Secondary | ICD-10-CM

## 2020-01-13 DIAGNOSIS — Z87898 Personal history of other specified conditions: Secondary | ICD-10-CM

## 2020-01-13 DIAGNOSIS — K219 Gastro-esophageal reflux disease without esophagitis: Secondary | ICD-10-CM

## 2020-01-13 DIAGNOSIS — M545 Low back pain, unspecified: Secondary | ICD-10-CM

## 2020-01-13 DIAGNOSIS — Z758 Other problems related to medical facilities and other health care: Secondary | ICD-10-CM

## 2020-01-13 DIAGNOSIS — Z603 Acculturation difficulty: Secondary | ICD-10-CM

## 2020-01-13 DIAGNOSIS — Z789 Other specified health status: Secondary | ICD-10-CM

## 2020-01-13 DIAGNOSIS — R519 Headache, unspecified: Secondary | ICD-10-CM

## 2020-01-13 LAB — POCT URINALYSIS DIP (CLINITEK)
Bilirubin, UA: NEGATIVE
Blood, UA: NEGATIVE
Glucose, UA: NEGATIVE mg/dL
Ketones, POC UA: NEGATIVE mg/dL
Leukocytes, UA: NEGATIVE
Nitrite, UA: NEGATIVE
POC PROTEIN,UA: NEGATIVE
Spec Grav, UA: 1.02
Urobilinogen, UA: 0.2 U/dL
pH, UA: 8.5 — AB

## 2020-01-13 LAB — POCT GLYCOSYLATED HEMOGLOBIN (HGB A1C): Hemoglobin A1C: 5.5 % (ref 4.0–5.6)

## 2020-01-13 NOTE — Progress Notes (Signed)
Per pt he stomach pain is still there. Per pt its worse at night. Patient would like to know what to do next about this stomach pain  Per pt she states that he had a headache last Thursday and Friday but did not take the medicine that was prescribed and went to sleep.

## 2020-01-13 NOTE — Progress Notes (Signed)
Established Patient Office Visit  Subjective:  Patient ID: Cheryl Espinoza, female    DOB: 1975/10/13  Age: 44 y.o. MRN: [id number]  CC: Follow-up of right lower quadrant pain/adnexal pain-Hersh Minney MD  Due to language barrier, [employer] video interpretation system used at today's visit  HPI Cheryl Espinoza, 44 year old Hispanic female, who is seen in follow-up of recent visit on 12/23/2019 at which time patient had complaint of right lower quadrant/adnexal pain and was scheduled for transvaginal pelvic ultrasound which showed no abnormalities to explain patient's discomfort/pain.  At today's visit, she continues to complain of pain in the right lower quadrant/adnexal area.  Pain comes and goes but can be sharp/stabbing when it occurs and ranges from about a 4-6 on a 0-to-10 scale.  There is nothing that she can do such as taking medication, changing position that will cause the pain to go away any sooner.  She does not believe that the pain is related to having a bowel movement or urinating.  She believes there is some association with pain occurring prior to onset of menses. Reflux symptoms improved with use of omeprazole.         She also has complaint of pain on the left side of her scalp which radiates from the back of her scalp and when it occurs her scalp feels sore to touch. She denies nausea with her headaches.  She feels as if lights are too bright/she has to squint her eyes during headaches. She does not feel as if the medication that she takes for migraines is helping with her current headaches. She has had a recent increase in mid to low back pain- dull aching and sometimes sharp with certain movements. Possible mild increase in the frequency of urination but no burning with urination.   Past Medical History:  Diagnosis Date  . GERD (gastroesophageal reflux disease)   . History of abnormal cervical Pap smear   . History of vitamin D deficiency   . Tension  headache     Past Surgical History:  Procedure Laterality Date  . CESAREAN SECTION  2000, 2002     Family History  Problem Relation Age of Onset  . Hyperlipidemia Mother   . Diabetes Father   . Diabetes Sister   . Cancer Neg Hx   . Heart disease Neg Hx     Social History   Socioeconomic History  . Marital status: Single    Spouse name: Not on file  . Number of children: 3   . Years of education: 9   . Highest education level: 9th grade  Occupational History  . Occupation: Restaurant [employer]   Tobacco Use  . Smoking status: Never Smoker  . Smokeless tobacco: Never Used  Substance and Sexual Activity  . Alcohol use: No    Alcohol/week: 0.0 standard drinks  . Drug use: No  . Sexual activity: Not Currently    Birth control/protection: None  Other Topics Concern  . Not on file  Social History Narrative   Lives at home with daughter.    38 yo daughter.    Two younger children live in [place].    Lived in [place] since 2004.    Social Determinants of Health   Financial Resource Strain:   . Difficulty of Paying Living Expenses:   Food Insecurity:   . Worried About Charity fundraiser in the Last Year:   . Arboriculturist in the Last Year:   [employer]  Needs: No Transportation Needs  . Lack of Transportation (Medical): No  . Lack of Transportation (Non-Medical): No  Physical Activity:   . Days of Exercise per Week:   . Minutes of Exercise per Session:   Stress:   . Feeling of Stress :   Social Connections:   . Frequency of Communication with Friends and Family:   . Frequency of Social Gatherings with Friends and Family:   . Attends Religious Services:   . Active Member of Clubs or Organizations:   . Attends Banker Meetings:   Marland Kitchen Marital Status:   Intimate Partner Violence:   . Fear of Current or Ex-Partner:   . Emotionally Abused:   Marland Kitchen Physically Abused:   . Sexually Abused:     Outpatient Medications Prior to Visit  Medication Sig Dispense  Refill  . ibuprofen (ADVIL,MOTRIN) 600 MG tablet Take 1 tablet (600 mg total) by mouth every 8 (eight) hours as needed (for chest wall pain; take after eating). 30 tablet 0  . metroNIDAZOLE (FLAGYL) 500 MG tablet Take 1 tablet (500 mg total) by mouth 2 (two) times daily. 14 tablet 0  . omeprazole (PRILOSEC) 20 MG capsule Take 1 capsule (20 mg total) by mouth 2 (two) times daily. 60 capsule 5  . SUMAtriptan (IMITREX) 25 MG tablet May repeat in 2 hours if headache persists or recurs. Do not exceed 4 doses in a 24 hour period 10 tablet 5   No facility-administered medications prior to visit.    No Known Allergies  ROS Review of Systems  Constitutional: Positive for fatigue. Negative for chills and fever.  HENT: Negative for sore throat and trouble swallowing.   Eyes: Positive for photophobia (during headaches) and visual disturbance (during headaches).  Respiratory: Negative for cough and shortness of breath.   Cardiovascular: Negative for chest pain and palpitations.  Gastrointestinal: Positive for abdominal pain. Negative for blood in stool, constipation, diarrhea, nausea and vomiting.       Reflux symptoms  Endocrine: Negative for cold intolerance, heat intolerance, polydipsia, polyphagia and polyuria.  Genitourinary: Negative for dysuria and frequency.  Musculoskeletal: Positive for back pain. Negative for arthralgias.  Neurological: Positive for headaches. Negative for dizziness.       Scalp feels sore to touch which can occur with or without headaches  Hematological: Negative for adenopathy. Does not bruise/bleed easily.      Objective:    Physical Exam  Constitutional: She is oriented to person, place, and time. She appears well-developed and well-nourished.  HENT:  Head: Normocephalic and atraumatic.  Patient with complaint of discomfort when left side of scalp touched  Eyes: Conjunctivae and EOM are normal.  Neck: No JVD present. No thyromegaly present.  Cardiovascular:  Normal rate and regular rhythm.  Pulmonary/Chest: Effort normal and breath sounds normal.  Abdominal: Soft. There is abdominal tenderness. There is no rebound and no guarding.  Mild suprapubic tenderness on exam. When patient places her hand on the area of greatest pain on her right lower abdomen, the area seems to be more so across the superior lateral top of pelvic bone  Musculoskeletal:        General: Tenderness present. No edema.     Comments: Mid-line and right lower lateral back pain to palpation. No radiation of pain with supine leg raise. Back discomfort with internal and external right hip rotation.   Lymphadenopathy:    She has no cervical adenopathy.  Neurological: She is alert and oriented to person, place, and time. No  cranial nerve deficit.  Skin: Skin is warm and dry. No rash noted.  Psychiatric: Her behavior is normal.  She appears anxious at times as well as slightly flattened affect versus fatigue.   Nursing note and vitals reviewed.   BP 115/78   Pulse 84   Temp 97.7 F (36.5 C) (Temporal)   Ht 5' (1.524 m)   Wt 153 lb (69.4 kg)   SpO2 100%   BMI 29.88 kg/m  Wt Readings from Last 3 Encounters:  01/13/20 153 lb (69.4 kg)  12/23/19 149 lb 12.8 oz (67.9 kg)  06/17/19 145 lb 4.8 oz (65.9 kg)     Lab Results  Component Value Date   TSH 0.870 09/21/2018   Lab Results  Component Value Date   WBC 7.6 09/21/2018   HGB 11.0 (L) 09/21/2018   HCT 32.0 (L) 09/21/2018   MCV 88 09/21/2018   PLT 326 09/21/2018   Lab Results  Component Value Date   NA 137 09/21/2018   K 4.5 09/21/2018   CO2 20 09/21/2018   GLUCOSE 89 09/21/2018   BUN 11 09/21/2018   CREATININE 0.66 09/21/2018   BILITOT 0.3 09/21/2018   ALKPHOS 60 09/21/2018   AST 17 09/21/2018   ALT 13 09/21/2018   PROT 7.8 09/21/2018   ALBUMIN 4.3 09/21/2018   CALCIUM 9.2 09/21/2018   No results found for: CHOL No results found for: HDL No results found for: LDLCALC No results found for: TRIG No  results found for: [facility] Lab Results  Component Value Date   HGBA1C 5.5 01/13/2020      Assessment & Plan:  1. Right lower quadrant abdominal pain Will perform UA in follow-up of right lower abdominal and suprapubic tenderness on exam. Patient's right lower quadrant pain seems to actually be on the bony area at the top of the right pelvis and order placed for patient to have an x-ray of her right hip/pelvis. GYN referral in follow-up of adnexal pain.  - POCT URINALYSIS DIP (CLINITEK) - DG HIP UNILAT W OR W/O PELVIS 1V RIGHT; Future  2. Adnexal pain GYN referral in follow-up of adnexal pain complaints. Recent pelvic US was normal. Recent pap smear was also normal. Patient is s/p treatment with metronidazole for bacterial vaginitis.  - Ambulatory referral to Gynecology  3. Gastroesophageal reflux disease, unspecified whether esophagitis present Continue use of omeprazole 20 mg twice per day as well as avoidance of known trigger foods and avoidance of late night eating.  4. Chronic migraine without aura without status migrainosus, not intractable 8. Scalp pain Patient with known migraines as well as tension headaches and she reports no relief of headaches with current medication, Imitrex, as well as recent new onset of scalp tenderness. She will be referred to Neurology for further evaluation and treatment.  - Ambulatory referral to Neurology  5. History of prediabetes She has had a prior Hgb A1c of 5.7 on 11/26/16 and A1c will be repeated today to see if prediabetes is currently an issue.  - HgB A1c  6. Suprapubic pain UA and urine culture as patient with suprapubic discomfort on exam.  - Urine Culture  7. Midline low back pain, unspecified chronicity, unspecified whether sciatica present Likely musculoskeletal. As she is having some right lower abdominal pain that appears to be over the top of the pelvis will have patient obtain X-rays of her lumbar spine and right hip/pelvis. She  may take over the counter medications as needed for pain and use warm moist heat  to the affected low back area.  - DG Lumbar Spine 2-3 Views; Future - DG HIP UNILAT W OR W/O PELVIS 1V RIGHT; Future  9. Language barrier   Video interpretation system used at today's visit to help with communication of health information at today's visit  An After Visit Summary was printed and given to the patient.   Follow-up: Return in about 8 weeks (around 03/09/2020) for chronic issues- sooner if not improved.   50 or more minutes was spent in face to face time with the patient at today's visit in part due to the need for interpretation services-time was spent obtaining her current symptoms, performing examination , discussing findings, assessment and treatment plans as well as an additional 14 minutes for chart review, pre and post visit, and completion of today's visit note.   Cain Saupe, MD

## 2020-01-15 LAB — URINE CULTURE: Organism ID, Bacteria: NO GROWTH

## 2020-01-18 ENCOUNTER — Encounter: Payer: Self-pay | Admitting: *Deleted

## 2020-03-09 ENCOUNTER — Ambulatory Visit: Payer: Self-pay | Admitting: Family Medicine

## 2020-03-23 ENCOUNTER — Ambulatory Visit: Payer: Self-pay | Attending: Family Medicine | Admitting: Physician Assistant

## 2020-03-23 ENCOUNTER — Encounter: Payer: Self-pay | Admitting: Physician Assistant

## 2020-03-23 ENCOUNTER — Other Ambulatory Visit: Payer: Self-pay

## 2020-03-23 DIAGNOSIS — N390 Urinary tract infection, site not specified: Secondary | ICD-10-CM

## 2020-03-23 DIAGNOSIS — R319 Hematuria, unspecified: Secondary | ICD-10-CM

## 2020-03-23 DIAGNOSIS — Z789 Other specified health status: Secondary | ICD-10-CM

## 2020-03-23 LAB — POCT URINALYSIS DIP (CLINITEK)
Bilirubin, UA: NEGATIVE
Blood, UA: NEGATIVE
Glucose, UA: NEGATIVE mg/dL
Ketones, POC UA: NEGATIVE mg/dL
Leukocytes, UA: NEGATIVE
Nitrite, UA: NEGATIVE
POC PROTEIN,UA: NEGATIVE
Spec Grav, UA: 1.025 (ref 1.010–1.025)
Urobilinogen, UA: 0.2 E.U./dL
pH, UA: 5.5 (ref 5.0–8.0)

## 2020-03-23 MED ORDER — FLUCONAZOLE 150 MG PO TABS: 150.0000 mg | ORAL_TABLET | Freq: Once | ORAL | 0 refills | Status: AC

## 2020-03-23 MED ORDER — SULFAMETHOXAZOLE-TRIMETHOPRIM 800-160 MG PO TABS: 1.0000 | ORAL_TABLET | Freq: Two times a day (BID) | ORAL | 0 refills | Status: DC

## 2020-03-23 MED FILL — FLUCONAZOLE 150 MG TABLET: 150 | 1 days supply | Qty: 1 | Fill #0

## 2020-03-23 MED FILL — ?SULFAMETHOXAZOLE-TMP DS TB: 800-160 | 5 days supply | Qty: 10 | Fill #0

## 2020-03-23 NOTE — Progress Notes (Signed)
Virtual Visit via Telephone Note  I connected with [name] on 03/23/20 at  1:30 PM EDT by telephone and verified that I am speaking with the correct person using two identifiers.   I discussed the limitations, risks, security and privacy concerns of performing an evaluation and management service by telephone and the availability of in person appointments. I also discussed with the patient that there may be a patient responsible charge related to this service. The patient expressed understanding and agreed to proceed.   PATIENT visit by telephone virtually in the context of Covid-19 pandemic. Patient location:  home My Location:  [facility] office Persons on the call:  Me, Carlos(interpreter) and the patient  History of Present Illness:   On and off dysuria for about 3 days. Feels like "hot vapors" when she pees.  No vaginal discharge.  No abdominal pain.  No fever.  No N/V/D.  She can drop off a UA today.  Originally she had scheduled the appt for something else that is already resolved.  No flank pain.  Appetite is good.     Observations/Objective:  NAD   Assessment and Plan: 1. Urinary tract infection with hematuria, site unspecified Increase water intake - POCT URINALYSIS DIP (CLINITEK) - Urine Culture - sulfamethoxazole-trimethoprim (BACTRIM DS) 800-160 MG tablet; Take 1 tablet by mouth 2 (two) times daily.  Dispense: 10 tablet; Refill: 0 - fluconazole (DIFLUCAN) 150 MG tablet; Take 1 tablet (150 mg total) by mouth once for 1 dose.  Dispense: 1 tablet; Refill: 0  2. Language barrier pacific interpreters used and additional time performing visit was required.     Follow Up Instructions: prn   I discussed the assessment and treatment plan with the patient. The patient was provided an opportunity to ask questions and all were answered. The patient agreed with the plan and demonstrated an understanding of the instructions.   The patient was advised to call back or  seek an in-person evaluation if the symptoms worsen or if the condition fails to improve as anticipated.  I provided 11 minutes of non-face-to-face time during this encounter.   Georgian Co, PA-C  Patient ID: Nalini Alcaraz, female   DOB: 1976-04-06, 44 y.o.   MRN: [id number]

## 2020-03-25 LAB — URINE CULTURE

## 2020-05-10 ENCOUNTER — Ambulatory Visit (INDEPENDENT_AMBULATORY_CARE_PROVIDER_SITE_OTHER): Payer: Self-pay | Admitting: Obstetrics and Gynecology

## 2020-05-10 ENCOUNTER — Other Ambulatory Visit: Payer: Self-pay

## 2020-05-10 DIAGNOSIS — G44209 Tension-type headache, unspecified, not intractable: Secondary | ICD-10-CM

## 2020-05-11 NOTE — Progress Notes (Signed)
Pt scheduled in error for gyn visit.  Primary complaint was headaches.

## 2020-05-16 ENCOUNTER — Telehealth: Payer: Self-pay | Admitting: Family Medicine

## 2020-05-16 NOTE — Telephone Encounter (Signed)
RC to pt via [facility], [phone]) to give info for mobile bus, pt unable to write down and requested paper copy of info be left at front desk for pick-up tomorrow.

## 2020-05-16 NOTE — Telephone Encounter (Signed)
Returned call and informed patient that she is already scheduled for 06/08/20 and there is nothing sooner. Please f/u   Copied from CRM [id number]. Topic: General - Other >> May 16, 2020 12:22 PM Jaquita Rector A wrote: Reason for CRM: Patient called to inform Dr Jillyn Hidden that she is having lots of pain in her eyes and head. Patient say that the pain will be so great at times that she may become dizzy and the bright lights will even burn her eyes. States that the medication is not helping like it once did and would like to be seen ASAP. Please call Ph# [phone]

## 2020-05-17 MED FILL — SUMATRIPTAN SUCC 25 MG TAB: 25 | 30 days supply | Qty: 10 | Fill #1

## 2020-05-18 ENCOUNTER — Ambulatory Visit: Payer: Self-pay | Admitting: Physician Assistant

## 2020-05-18 ENCOUNTER — Other Ambulatory Visit: Payer: Self-pay

## 2020-05-18 VITALS — BP 126/86 | HR 84 | Temp 97.8°F | Resp 18 | Ht 62.0 in | Wt 163.0 lb

## 2020-05-18 DIAGNOSIS — G44209 Tension-type headache, unspecified, not intractable: Secondary | ICD-10-CM

## 2020-05-18 DIAGNOSIS — D649 Anemia, unspecified: Secondary | ICD-10-CM

## 2020-05-18 DIAGNOSIS — E559 Vitamin D deficiency, unspecified: Secondary | ICD-10-CM

## 2020-05-18 MED FILL — SUMATRIPTAN SUCC 25 MG TAB: 25 | 30 days supply | Qty: 10 | Fill #2

## 2020-05-18 NOTE — Patient Instructions (Addendum)
Please take zyrtec and use "dry eye" eye drops daily as directed on box.  Please take Vit D 2,000 international units each day as well.  Make sure to drink plenty of water and get plenty of rest.  Please return to the [facility] Medicine Unit on Tuesday to discuss lab results and further review.  I hope that you feel better soon  Roney Jaffe, PA-C Physician Assistant [facility] [email or web address]   Artificial Tears eye solution Qu es este medicamento? [employer] ARTIFICIALES alivian la irritacin y la molestia ocasionadas por la sequedad de los ojos. Este medicamento puede ser utilizado para otros usos; si tiene alguna pregunta consulte con su proveedor de atencin mdica o con su Social research officer, government. MARCAS COMUNES: Advanced Eye Relief, Akwa Tears, Akwa Tears Renewed, Artificial Tears, Bion Tears, Blink Tears, Clear eyes, Clear eyes Outdoor Dry Eye Protection, FreshKote, GenTeal Mild, GenTeal Moderate, GenTeal PF, Gonak, Goniosoft, Hypo Tears, Isopto Tears, LiquiTears, Lubricating Plus, Moisture Eyes, Moisture Eyes Preservative Free, Murine, Natural Balance Tears, Nature's Tears, Opti-Free, Puralube Tears, Refresh, Refresh Celluvisc, Refresh Contacts Comfort, Refresh Endura, Refresh Optive, Refresh Optive Sensitive, Refresh Plus, Refresh RELIEVA, Refresh Tears, Retaine CMC, Systane Balance, Systane Complete, Teargen, Tears Naturale Forte, Tears Naturale II, Tears Renewed, TheraTears, Visine Advanced, Visine Dry Eye Relief, Visine Pure Tears, Visine Tears, Visine Tired Eye Relief, Viva Qu le debo informar a mi profesional de la salud antes de tomar este medicamento? Necesita saber si usted presenta alguno de los siguientes problemas o situaciones:  cambios en la visin  infeccin o traumatismo ocular  si [place] lentes de contacto  una reaccin alrgica o inusual a las lgrimas artificiales, a otros medicamentos, alimentos,  colorantes o conservantes  si est embarazada o buscando quedar embarazada  si est amamantando a un beb Cmo debo utilizar este medicamento? Este medicamento es para Chemical engineer [employer] ojos. No lo tome por va oral. Siga las instrucciones de la etiqueta del medicamento. Lvese las manos antes y despus de usarlo. Incline la cabeza ligeramente hacia atrs y lleve el prpado inferior hacia abajo con su dedo ndice para formar [employer]. Trate de que la punta del gotero no toque el ojo, las yemas de los dedos o cualquier otra superficie. Aplique la cantidad de gotas recetada (generalmente de una a [employer]ée [employer]uder) dentro de la bolsa. Cierre los ojos suavemente durante unos instantes para permitir que las gotas entren en contacto con el ojo. Utilice sus dosis a intervalos regulares. No utilice el medicamento con una frecuencia mayor a la indicada. Hable con su pediatra para informarse acerca del uso de este medicamento en nios. Aunque este medicamento ha sido recetado a nios tan menores como de 6 aos de edad para condiciones selectivas, las precauciones se aplican. Sobredosis: Pngase en contacto inmediatamente con un centro toxicolgico o una sala de urgencia si usted cree que haya tomado demasiado medicamento. ATENCIN: [employer] es solo para usted. No comparta este medicamento con nadie. Qu sucede si me olvido de una dosis? Si olvida una dosis, aplquela lo antes posible. Si es casi la hora de la prxima dosis, aplique slo esa dosis. No use dosis adicionales o dobles. Qu puede interactuar con este medicamento? No se esperan interacciones. Si est utilizando otras gotas oculares junto con este medicamento, separe las aplicaciones de cada gota ocular por intervalos de aproximadamente 5 minutos. De esta manera se asegura de que ninguna de las gotas interfiera con la otra. Si est utilizando gotas oculares y [employer]  ocular, utilice las gotas 10 minutos antes de aplicarse la pomada a fin  de que esta ltima no interfiera con la accin de las primeras. Puede ser que esta lista no menciona todas las posibles interacciones. Informe a su profesional de [employer] de [employer] productos a base de hierbas, medicamentos de [place] o suplementos nutritivos que est tomando. Si usted fuma, consume bebidas alcohlicas o si utiliza drogas ilegales, indqueselo tambin a su profesional de [employer]. Algunas sustancias pueden interactuar con su medicamento. A qu debo estar atento al usar PPL Corporation? Si experimenta dolor ocular, cambios en la visin, enrojecimiento o irritacin continua de los ojos o si su problema ocular empeora o persiste durante ms de 72 horas, suspenda el uso y comunquese con su profesional de Radiographer, therapeutic. Para evitar la contaminacin del producto, evite que la punta del envase entre en contacto con cualquier superficie. No comparta este medicamento con nadie. Si este producto cambia de color, no lo utilice. Si [place] lentes de contacto, debe quitrselos antes de [employer] en sus ojos. Espere por lo menos 15 minutos despus de aplicar las gotas antes de volver a colocarse sus lentes de contacto. Qu efectos secundarios puedo tener al [employer] este medicamento? Efectos secundarios que debe informar a su mdico o a Producer, television/film/video de la salud tan pronto como sea posible:  Therapist, art como erupcin cutnea, picazn o urticarias, hinchazn de la cara, labios o lengua  cambios en la visin  irritacin o enrojecimiento de los ojos que empeora o que dura ms de 72 horas  dolor en los ojos Efectos secundarios que, por lo general, no requieren Psychologist, prison and probation services (debe informarlos a su mdico o a su profesional de la salud si persisten o si son molestos):  visin borrosa o escozor temporal al [employer] las gotas oculares Puede ser que esta lista no menciona todos los posibles efectos secundarios. Comunquese a su mdico por asesoramiento mdico [employer]ands. Usted puede informar los efectos secundarios a la FDA por telfono al 1-800-FDA-1088. Dnde debo guardar mi medicina? Mantngala fuera del alcance de los nios. Gurdela a San[employer]I, entre 15 y 30 grados C (26 y 39 grados F). No la congele. Deseche todo el medicamento que no haya utilizado, despus de la fecha de vencimiento. Una vez abierto el producto, la mayora de expertos recomiendan descartarlo despues de [address]. ATENCIN: Este folleto es un resumen. Puede ser que no cubra toda la posible informacin. Si usted tiene preguntas acerca de esta medicina, consulte con su mdico, su farmacutico o su profesional de Radiographer, therapeutic.  2020 Elsevier/Gold Standard (2014-10-04 00:00:00)   General Headache Without Cause A headache is pain or discomfort that is felt around the head or neck area. There are many causes and types of headaches. In some cases, the cause may not be found. Follow these instructions at home: Watch your condition for any changes. Let your doctor know about them. Take these steps to help with your condition: Managing pain      Take over-the-counter and prescription medicines only as told by your doctor.  Lie down in a dark, quiet room when you have a headache.  If told, put ice on your head and neck area: ? Put ice in a plastic bag. ? Place a towel between your skin and the bag. ? Leave the ice on for 20 minutes, 2-3 times per day.  If told, put heat on the affected area. Use the heat source that your  doctor recommends, such as a moist heat pack or a heating pad. ? Place a towel between your skin and the heat source. ? Leave the heat on for 20-30 minutes. ? Remove the heat if your skin turns bright red. This is very important if you are unable to feel pain, heat, or cold. You may have a greater risk of getting burned.  Keep lights dim if bright lights bother you or make your headaches worse. Eating and drinking  Eat meals on a regular  schedule.  If you drink alcohol: ? Limit how much you use to:  0-1 drink a day for women.  0-2 drinks a day for men. ? Be aware of how much alcohol is in your drink. In the [place] one drink equals one 12 oz bottle of beer (355 mL), one 5 oz glass of wine (148 mL), or one 1 oz glass of hard liquor (44 mL).  Stop drinking caffeine, or reduce how much caffeine you drink. General instructions   Keep a journal to find out if certain things bring on headaches. For example, write down: ? What you eat and drink. ? How much sleep you get. ? Any change to your diet or medicines.  Get a massage or try other ways to relax.  Limit stress.  Sit up straight. Do not tighten (tense) your muscles.  Do not use any products that contain nicotine or tobacco. This includes cigarettes, e-cigarettes, and chewing tobacco. If you need help quitting, ask your doctor.  Exercise regularly as told by your doctor.  Get enough sleep. This often means 7-9 hours of sleep each night.  Keep all follow-up visits as told by your doctor. This is important. Contact a doctor if:  Your symptoms are not helped by medicine.  You have a headache that feels different than the other headaches.  You feel sick to your stomach (nauseous) or you throw up (vomit).  You have a fever. Get help right away if:  Your headache gets very bad quickly.  Your headache gets worse after a lot of physical activity.  You keep throwing up.  You have a stiff neck.  You have trouble seeing.  You have trouble speaking.  You have pain in the eye or ear.  Your muscles are weak or you lose muscle control.  You lose your balance or have trouble walking.  You feel like you will pass out (faint) or you pass out.  You are mixed up (confused).  You have a seizure. Summary  A headache is pain or discomfort that is felt around the head or neck area.  There are many causes and types of headaches. In some cases, the cause may  not be found.  Keep a journal to help find out what causes your headaches. Watch your condition for any changes. Let your doctor know about them.  Contact a doctor if you have a headache that is different from usual, or if your headache is not helped by medicine.  Get help right away if your headache gets very bad, you throw up, you have trouble seeing, you lose your balance, or you have a seizure. This information is not intended to replace advice given to you by your health care provider. Make sure you discuss any questions you have with your health care provider. Document Revised: 03/02/2018 Document Reviewed: 03/02/2018 Elsevier Patient Education  2020 [employer].

## 2020-05-18 NOTE — Progress Notes (Signed)
Established Patient Office Visit  Subjective:  Patient ID: Cheryl Espinoza, female    DOB: 1976/03/28  Age: 44 y.o. MRN: [id number]  CC:  Chief Complaint  Patient presents with  . Eye Pain    HPI [name] reports that she has been having discomfort in both eyes, states that they feel burning  For the past 8 days, worse when she gets a headache.  States that she has tried OTC camomile eyedrops without relief, and Imitrex for the headache with some relief of headaches  Denies change in vision or blurry vision.  Denies any new eye make-ups, face products.  Reports sleep is good, stress level is low.  Due to language barrier, an interpreter was present during the history-taking and subsequent discussion (and for part of the physical exam) with this patient.    Past Medical History:  Diagnosis Date  . GERD (gastroesophageal reflux disease)   . History of abnormal cervical Pap smear   . History of vitamin D deficiency   . Tension headache     Past Surgical History:  Procedure Laterality Date  . CESAREAN SECTION  2000, 2002     Family History  Problem Relation Age of Onset  . Hyperlipidemia Mother   . Diabetes Father   . Diabetes Sister   . Cancer Neg Hx   . Heart disease Neg Hx     Social History   Socioeconomic History  . Marital status: Single    Spouse name: Not on file  . Number of children: 3   . Years of education: 9   . Highest education level: 9th grade  Occupational History  . Occupation: Restaurant Prep   Tobacco Use  . Smoking status: Never Smoker  . Smokeless tobacco: Never Used  Vaping Use  . Vaping Use: Never used  Substance and Sexual Activity  . Alcohol use: No    Alcohol/week: 0.0 standard drinks  . Drug use: No  . Sexual activity: Not Currently    Birth control/protection: None  Other Topics Concern  . Not on file  Social History Narrative   Lives at home with daughter.    53 yo daughter.    Two  younger children [place] in [place]    Lived in [place] since 2004.    Social Determinants of Health   Financial Resource Strain:   . Difficulty of Paying Living Expenses: Not on file  Food Insecurity:   . Worried About Programme researcher, broadcasting/film/video in the Last Year: Not on file  . Ran Out of Food in the Last Year: Not on file  Transportation Needs:   . Lack of Transportation (Medical): Not on file  . Lack of Transportation (Non-Medical): Not on file  Physical Activity:   . Days of Exercise per Week: Not on file  . Minutes of Exercise per Session: Not on file  Stress:   . Feeling of Stress : Not on file  Social Connections:   . Frequency of Communication with Friends and Family: Not on file  . Frequency of Social Gatherings with Friends and Family: Not on file  . Attends Religious Services: Not on file  . Active Member of Clubs or Organizations: Not on file  . Attends Banker Meetings: Not on file  . Marital Status: Not on file  Intimate Partner Violence:   . Fear of Current or Ex-Partner: Not on file  . Emotionally Abused: Not on file  . Physically Abused: Not  on file  . Sexually Abused: Not on file    Outpatient Medications Prior to Visit  Medication Sig Dispense Refill  . ibuprofen (ADVIL,MOTRIN) 600 MG tablet Take 1 tablet (600 mg total) by mouth every 8 (eight) hours as needed (for chest wall pain; take after eating). 30 tablet 0  . SUMAtriptan (IMITREX) 25 MG tablet May repeat in 2 hours if headache persists or recurs. Do not exceed 4 doses in a 24 hour period 10 tablet 5  . metroNIDAZOLE (FLAGYL) 500 MG tablet Take 1 tablet (500 mg total) by mouth 2 (two) times daily. 14 tablet 0  . omeprazole (PRILOSEC) 20 MG capsule Take 1 capsule (20 mg total) by mouth 2 (two) times daily. 60 capsule 5  . sulfamethoxazole-trimethoprim (BACTRIM DS) 800-160 MG tablet Take 1 tablet by mouth 2 (two) times daily. 10 tablet 0   No facility-administered medications prior to visit.    No  Known Allergies  ROS Review of Systems  Constitutional: Negative for chills, fatigue and fever.  HENT: Negative.   Eyes: Negative for photophobia, pain, discharge, redness, itching and visual disturbance.  Respiratory: Negative.   Cardiovascular: Negative.   Gastrointestinal: Negative.   Endocrine: Negative.   Genitourinary: Negative.   Musculoskeletal: Negative.   Skin: Negative.   Allergic/Immunologic: Negative.   Neurological: Positive for headaches.  Hematological: Negative.   Psychiatric/Behavioral: Negative.       Objective:    Physical Exam Vitals and nursing note reviewed.  Constitutional:      Appearance: Normal appearance. She is normal weight.  HENT:     Head: Normocephalic and atraumatic.     Right Ear: Tympanic membrane, ear canal and external ear normal.     Left Ear: Tympanic membrane, ear canal and external ear normal.     Nose: Nose normal.     Mouth/Throat:     Mouth: Mucous membranes are moist.     Pharynx: Oropharynx is clear.  Eyes:     General: Lids are normal.        Right eye: No foreign body or discharge.        Left eye: No foreign body or discharge.     Extraocular Movements: Extraocular movements intact.     Right eye: Normal extraocular motion.     Left eye: Normal extraocular motion.     Conjunctiva/sclera:     Right eye: Right conjunctiva is not injected.     Left eye: Left conjunctiva is not injected.     Pupils: Pupils are equal, round, and reactive to light.  Cardiovascular:     Rate and Rhythm: Normal rate and regular rhythm.     Pulses: Normal pulses.     Heart sounds: Normal heart sounds.  Pulmonary:     Effort: Pulmonary effort is normal.     Breath sounds: Normal breath sounds.  Musculoskeletal:        General: Normal range of motion.     Cervical back: Normal range of motion and neck supple.  Skin:    General: Skin is warm and dry.  Neurological:     General: No focal deficit present.     Mental Status: She is alert  and oriented to person, place, and time.  Psychiatric:        Mood and Affect: Mood normal.        Behavior: Behavior normal.        Thought Content: Thought content normal.        Judgment: Judgment normal.  BP 126/86 (BP Location: Left Arm, Patient Position: Sitting, Cuff Size: Normal)   Pulse 84   Temp 97.8 F (36.6 C) (Oral)   Resp 18   Ht 5\' 2"  (1.575 m)   Wt 163 lb (73.9 kg)   SpO2 97%   BMI 29.81 kg/m  Wt Readings from Last 3 Encounters:  05/18/20 163 lb (73.9 kg)  01/13/20 153 lb (69.4 kg)  12/23/19 149 lb 12.8 oz (67.9 kg)     Health Maintenance Due  Topic Date Due  . Hepatitis C Screening  Never done  . COVID-19 Vaccine (1) Never done  . INFLUENZA VACCINE  03/26/2020    There are no preventive care reminders to display for this patient.  Lab Results  Component Value Date   TSH 0.870 09/21/2018   Lab Results  Component Value Date   WBC 7.6 09/21/2018   HGB 11.0 (L) 09/21/2018   HCT 32.0 (L) 09/21/2018   MCV 88 09/21/2018   PLT 326 09/21/2018   Lab Results  Component Value Date   NA 137 09/21/2018   K 4.5 09/21/2018   CO2 20 09/21/2018   GLUCOSE 89 09/21/2018   BUN 11 09/21/2018   CREATININE 0.66 09/21/2018   BILITOT 0.3 09/21/2018   ALKPHOS 60 09/21/2018   AST 17 09/21/2018   ALT 13 09/21/2018   PROT 7.8 09/21/2018   ALBUMIN 4.3 09/21/2018   CALCIUM 9.2 09/21/2018   No results found for: CHOL No results found for: HDL No results found for: LDLCALC No results found for: TRIG No results found for: CHOLHDL Lab Results  Component Value Date   HGBA1C 5.5 01/13/2020      Assessment & Plan:   Problem List Items Addressed This Visit      Other   Tension headache - Primary (Chronic)   Relevant Orders   Comp. Metabolic Panel (12)   TSH   Vitamin D deficiency   Relevant Orders   Vitamin D, 25-hydroxy   Anemia   Relevant Orders   CBC with Differential/Platelet   Iron, TIBC and Ferritin Panel    1. Tension  headache  Encouraged patient to continue current regimen, trial Zyrtec, over-the-counter dry eye drops, vitamin D 2000 units once daily, increase water intake.  Red flags given for prompt evaluation at emergency department.  Patient to return to mobile medicine unit on Tuesday for further review and lab results - Comp. Metabolic Panel (12) - TSH  2. Vitamin D deficiency  - Vitamin D, 25-hydroxy  3. Anemia, unspecified type Patient states that she previously took iron, states that she was told that this was resolved - CBC with Differential/Platelet - Iron, TIBC and Ferritin Panel   I have reviewed the patient's medical history (PMH, PSH, Social History, Family History, Medications, and allergies) , and have been updated if relevant. I spent 20 minutes reviewing chart and  face to face time with patient.     No orders of the defined types were placed in this encounter.   Follow-up: Return in about 5 days (around 05/23/2020).    05/25/2020 [place], PA-C

## 2020-05-19 LAB — COMP. METABOLIC PANEL (12)
AST: 18 IU/L (ref 0–40)
Albumin/Globulin Ratio: 1.3 (ref 1.2–2.2)
Albumin: 4.4 g/dL (ref 3.8–4.8)
Alkaline Phosphatase: 89 IU/L (ref 44–121)
BUN/Creatinine Ratio: 22 (ref 9–23)
BUN: 11 mg/dL (ref 6–24)
Bilirubin Total: <0.2 mg/dL (ref 0.0–1.2)
Calcium: 9.1 mg/dL (ref 8.7–10.2)
Chloride: 103 mmol/L (ref 96–106)
Creatinine, Ser: 0.5 mg/dL — ABNORMAL LOW (ref 0.57–1.00)
GFR calc Af Amer: 138 mL/min/{1.73_m2} (ref >59)
GFR calc non Af Amer: 120 mL/min/{1.73_m2} (ref >59)
Globulin, Total: 3.5 g/dL (ref 1.5–4.5)
Glucose: 96 mg/dL (ref 65–99)
Potassium: 4.2 mmol/L (ref 3.5–5.2)
Sodium: 136 mmol/L (ref 134–144)
Total Protein: 7.9 g/dL (ref 6.0–8.5)

## 2020-05-19 LAB — CBC WITH DIFFERENTIAL/PLATELET
Basophils Absolute: 0 10*3/uL (ref 0.0–0.2)
Basos: 0 %
EOS (ABSOLUTE): 0.2 10*3/uL (ref 0.0–0.4)
Eos: 2 %
Hematocrit: 29.4 % — ABNORMAL LOW (ref 34.0–46.6)
Hemoglobin: 9.4 g/dL — ABNORMAL LOW (ref 11.1–15.9)
Immature Grans (Abs): 0 10*3/uL (ref 0.0–0.1)
Immature Granulocytes: 0 %
Lymphocytes Absolute: 2.9 10*3/uL (ref 0.7–3.1)
Lymphs: 31 %
MCH: 26.3 pg — ABNORMAL LOW (ref 26.6–33.0)
MCHC: 32 g/dL (ref 31.5–35.7)
MCV: 82 fL (ref 79–97)
Monocytes Absolute: 0.5 10*3/uL (ref 0.1–0.9)
Monocytes: 6 %
Neutrophils Absolute: 5.7 10*3/uL (ref 1.4–7.0)
Neutrophils: 61 %
Platelets: 414 10*3/uL (ref 150–450)
RBC: 3.58 x10E6/uL — ABNORMAL LOW (ref 3.77–5.28)
RDW: 15.6 % — ABNORMAL HIGH (ref 11.7–15.4)
WBC: 9.3 10*3/uL (ref 3.4–10.8)

## 2020-05-19 LAB — IRON,TIBC AND FERRITIN PANEL
Ferritin: 6 ng/mL — ABNORMAL LOW (ref 15–150)
Iron Saturation: 3 % — CL (ref 15–55)
Iron: 17 ug/dL — ABNORMAL LOW (ref 27–159)
Total Iron Binding Capacity: 530 ug/dL — ABNORMAL HIGH (ref 250–450)
UIBC: 513 ug/dL — ABNORMAL HIGH (ref 131–425)

## 2020-05-19 LAB — TSH: TSH: 1.51 u[IU]/mL (ref 0.450–4.500)

## 2020-05-19 LAB — VITAMIN D 25 HYDROXY (VIT D DEFICIENCY, FRACTURES): Vit D, 25-Hydroxy: 13.1 ng/mL — ABNORMAL LOW (ref 30.0–100.0)

## 2020-05-22 ENCOUNTER — Telehealth: Payer: Self-pay

## 2020-05-22 NOTE — Telephone Encounter (Signed)
Patient appointment rescheduled over the phone with Spanish interpreter

## 2020-05-23 ENCOUNTER — Ambulatory Visit: Payer: Self-pay

## 2020-05-23 ENCOUNTER — Telehealth: Payer: Self-pay | Admitting: *Deleted

## 2020-05-23 NOTE — Telephone Encounter (Signed)
Patient verified DOB Interpreter Name:Jessica Interpreter #: [id number].  Patient is aware of needing to pick up vitamin d 50,000 units and iron 325 OTC. Patient is scheduled for Thursday at 2:20 for FU.

## 2020-05-23 NOTE — Telephone Encounter (Signed)
-----   Message from Heidi Dach, CMA sent at 05/23/2020  9:02 AM EDT -----  ----- Message ----- From: Roney Jaffe, PA-C Sent: 05/22/2020  11:26 AM EDT To: Heidi Dach, CMA  Please call patient and let her know that her vitamin D is low, she needs to take vitamin D 50,000 units once a week for the next 12 weeks.  She is also severely low on iron, she needs to begin ferrous sulfate 325 mgm once daily, if tolerated she needs to increase this dose to 2-3 tabs a day over the next couple of weeks.  She was due to f/u in the [facility] on Tuesday  9/28; please have her f/u at [facility] om Wednesday 9/29 but encourage her to go ahead and start the iron and vit d

## 2020-05-25 ENCOUNTER — Ambulatory Visit: Payer: Self-pay

## 2020-05-30 ENCOUNTER — Telehealth: Payer: Self-pay | Admitting: Family Medicine

## 2020-05-30 NOTE — Telephone Encounter (Signed)
Copied from CRM [id number]. Topic: General - Inquiry >> May 29, 2020 12:38 PM Adrian Prince D wrote: Reason for CRM: Vernona Rieger from [facility] called and would like for you to resend the fax for the patient, the end was cut of. She asked that this message go to [facility] the Artist. The fax number is [phone]. Please advise

## 2020-06-05 MED FILL — SUMATRIPTAN SUCC 25 MG TAB: 25 | 10 days supply | Qty: 10 | Fill #3

## 2020-06-08 ENCOUNTER — Other Ambulatory Visit: Payer: Self-pay

## 2020-06-08 ENCOUNTER — Ambulatory Visit: Payer: Self-pay | Attending: Family | Admitting: Family

## 2020-06-08 ENCOUNTER — Encounter (HOSPITAL_COMMUNITY): Payer: Self-pay | Admitting: Emergency Medicine

## 2020-06-08 ENCOUNTER — Other Ambulatory Visit: Payer: Self-pay | Admitting: Family

## 2020-06-08 ENCOUNTER — Emergency Department (HOSPITAL_COMMUNITY)
Admission: EM | Admit: 2020-06-08 | Discharge: 2020-06-09 | Disposition: A | Discharge status: Home / Self Care | Payer: Self-pay | Attending: Emergency Medicine | Admitting: Emergency Medicine

## 2020-06-08 DIAGNOSIS — Z5321 Procedure and treatment not carried out due to patient leaving prior to being seen by health care provider: Secondary | ICD-10-CM | POA: Insufficient documentation

## 2020-06-08 DIAGNOSIS — Z789 Other specified health status: Secondary | ICD-10-CM

## 2020-06-08 DIAGNOSIS — G43709 Chronic migraine without aura, not intractable, without status migrainosus: Secondary | ICD-10-CM

## 2020-06-08 DIAGNOSIS — R519 Headache, unspecified: Secondary | ICD-10-CM

## 2020-06-08 LAB — COMPREHENSIVE METABOLIC PANEL
ALT: 28 U/L (ref 0–44)
AST: 35 U/L (ref 15–41)
Albumin: 4 g/dL (ref 3.5–5.0)
Alkaline Phosphatase: 68 U/L (ref 38–126)
Anion gap: 11 (ref 5–15)
BUN: 9 mg/dL (ref 6–20)
CO2: 20 mmol/L — ABNORMAL LOW (ref 22–32)
Calcium: 9.2 mg/dL (ref 8.9–10.3)
Chloride: 105 mmol/L (ref 98–111)
Creatinine, Ser: 0.66 mg/dL (ref 0.44–1.00)
GFR, Estimated: >60 mL/min (ref >60)
Glucose, Bld: 99 mg/dL (ref 70–99)
Potassium: 4 mmol/L (ref 3.5–5.1)
Sodium: 136 mmol/L (ref 135–145)
Total Bilirubin: 0.3 mg/dL (ref 0.3–1.2)
Total Protein: 8 g/dL (ref 6.5–8.1)

## 2020-06-08 LAB — CBC WITH DIFFERENTIAL/PLATELET
Abs Immature Granulocytes: 0.04 10*3/uL (ref 0.00–0.07)
Basophils Absolute: 0 10*3/uL (ref 0.0–0.1)
Basophils Relative: 0 %
Eosinophils Absolute: 0.1 10*3/uL (ref 0.0–0.5)
Eosinophils Relative: 2 %
HCT: 34.1 % — ABNORMAL LOW (ref 36.0–46.0)
Hemoglobin: 10.1 g/dL — ABNORMAL LOW (ref 12.0–15.0)
Immature Granulocytes: 0 %
Lymphocytes Relative: 30 %
Lymphs Abs: 2.9 10*3/uL (ref 0.7–4.0)
MCH: 26.2 pg (ref 26.0–34.0)
MCHC: 29.6 g/dL — ABNORMAL LOW (ref 30.0–36.0)
MCV: 88.3 fL (ref 80.0–100.0)
Monocytes Absolute: 0.6 10*3/uL (ref 0.1–1.0)
Monocytes Relative: 6 %
Neutro Abs: 5.9 10*3/uL (ref 1.7–7.7)
Neutrophils Relative %: 62 %
Platelets: 377 10*3/uL (ref 150–400)
RBC: 3.86 MIL/uL — ABNORMAL LOW (ref 3.87–5.11)
RDW: 18.2 % — ABNORMAL HIGH (ref 11.5–15.5)
WBC: 9.6 10*3/uL (ref 4.0–10.5)
nRBC: 0 % (ref 0.0–0.2)

## 2020-06-08 LAB — I-STAT BETA HCG BLOOD, ED (MC, WL, AP ONLY): I-stat hCG, quantitative: <5 m[IU]/mL (ref <5)

## 2020-06-08 MED ORDER — SUMATRIPTAN SUCCINATE 50 MG PO TABS: ORAL_TABLET | ORAL | 2 refills | Status: DC

## 2020-06-08 NOTE — ED Notes (Signed)
NA x4 

## 2020-06-08 NOTE — ED Notes (Signed)
This RN never visualized pt before elopement was noted.

## 2020-06-08 NOTE — Progress Notes (Signed)
Virtual Visit via Telephone Note  I connected with Cheryl Espinoza, on 06/08/2020 at 3:01 PM by telephone due to the COVID-19 pandemic and verified that I am speaking with the correct person using two identifiers.  Due to current restrictions/limitations of in-office visits due to the COVID-19 pandemic, this scheduled clinical appointment was converted to a telehealth visit.   Consent: I discussed the limitations, risks, security and privacy concerns of performing an evaluation and management service by telephone and the availability of in person appointments. I also discussed with the patient that there may be a patient responsible charge related to this service. The patient expressed understanding and agreed to proceed.  Location of Patient: Home  Location of Provider: [facility]  Persons participating in Telemedicine visit: Cheryl Carrender, NP Cheryl Espinoza, CMA [employer], Interpreter Name: [name], [place]: [id number]  History of Present Illness: Cheryl Espinoza. Cheryl Espinoza is a 44 year-old female with history of  GERD, tension headache, vitamin D deficiency, and anemia who presents for headaches.   1. HEADACHE:  01/13/2020: Visit with Dr. Jillyn Hidden. During that encounter chronic migraine and scalp pain. No relief with current medication Imitrex and recent new onset of scalp tenderness. Referred to Neurology.  06/08/2020: Onset: 3 days ago became worse Duration: headache has worsened over the last 1 month,  Location: entire head Quality: stabbing pain and feels as if head is going to explode, cannot brush hair or touch head without pain, eyes hurt, head hurt when laying on a pillow in bed Frequency: sometimes headache lasts 3 days  Associated Symptoms Nausea/vomiting: no  Photophobia/phonophobia: yes, photophobia  Tearing of eyes: no Sinus pain/pressure: no  Personal stressors: no   Red Flags Fever: no  Neck  pain/stiffness: no  Vision/speech/swallow/hearing difficulty: no  Focal weakness/numbness: no  Altered mental status: no  Trauma: no  New type of headache: yes, worst headache of life   Comments: States Neurosurgery never called her for an appointment.   Past Medical History:  Diagnosis Date  . GERD (gastroesophageal reflux disease)   . History of abnormal cervical Pap smear   . History of vitamin D deficiency   . Tension headache    No Known Allergies  Current Outpatient Medications on File Prior to Visit  Medication Sig Dispense Refill  . ibuprofen (ADVIL,MOTRIN) 600 MG tablet Take 1 tablet (600 mg total) by mouth every 8 (eight) hours as needed (for chest wall pain; take after eating). 30 tablet 0  . SUMAtriptan (IMITREX) 25 MG tablet May repeat in 2 hours if headache persists or recurs. Do not exceed 4 doses in a 24 hour period 10 tablet 5   No current facility-administered medications on file prior to visit.    Observations/Objective: Alert and oriented x 3. Not in acute distress. Physical examination not completed as this is a telemedicine visit.  Assessment and Plan: 1. Chronic migraine without aura without status migrainosus, not intractable: - Increase Sumatriptan from 25 mg to 50 mg and continue as prescribed. - Discussed management of migraines with abortive therapy. Advised to take Sumatriptan at the very start of a headache. -  Advised to avoid triggers including eating chocolate. - Encourage healthy eating, adequate rest and try to stay active. - Referral to Neurology for further evaluation and management.  - Follow-up with primary physician in 4 to 6 weeks or sooner if needed. - Patient was given clear instructions to go to Emergency Department or return to medical center  if symptoms don't improve, worsen, or new problems develop.The patient verbalized understanding. - SUMAtriptan (IMITREX) 50 MG tablet; 50 mg (1 tab) by mouth at the start of the headache. May  repeat in 2 hours x 1 if headache persists. Max of 2 tabs/24 hours  Dispense: 10 tablet; Refill: 2 - Ambulatory referral to Neurology  2. Worst headache of life: - Patient with worst headache of life which began about 1 month ago.  - Will get MRI of brain.  - Referral to Neurology for further evaluation and management.  - Follow-up with primary physician in 4 to  6 weeks or sooner if needed.  - Patient was given clear instructions to go to Emergency Department or return to medical center if symptoms don't improve, worsen, or new problems develop.The patient verbalized understanding. - Ambulatory referral to Neurology - MR Brain W Wo Contrast; Future  3. Language barrier: Merchant navy officer participated during today's visit. - Interpreter Name: [name]: [id number]  Follow Up Instructions: Follow-up with primary physician in 4 to 6 weeks or sooner if needed.   Patient was given clear instructions to go to Emergency Department or return to medical center if symptoms don't improve, worsen, or new problems develop.The patient verbalized understanding.  I discussed the assessment and treatment plan with the patient. The patient was provided an opportunity to ask questions and all were answered. The patient agreed with the plan and demonstrated an understanding of the instructions.   The patient was advised to call back or seek an in-person evaluation if the symptoms worsen or if the condition fails to improve as anticipated.   I provided 31 minutes total of non-face-to-face time during this encounter including median intraservice time, reviewing previous notes, labs, imaging, medications, management and patient verbalized understanding.    Rema Fendt, NP  [facility] [place], [place] [phone]   06/08/2020, 8:47 AM

## 2020-06-08 NOTE — ED Triage Notes (Signed)
Spanish speaker pt send here by PCP for c/o HA for a month not getting relief with prescribed medication. Here for further evaluation and MRI.

## 2020-06-08 NOTE — ED Provider Notes (Cosign Needed)
Patient eloped from the emergency department before provider evaluation. I did not see or evaluate this patient. Triage note states patient presenting with headache x 1 month.    Sherene Sires, PA-C 06/08/20 2050

## 2020-06-08 NOTE — Patient Instructions (Signed)
Sumatriptan for migraines. MRI of brain. Referral to Neurology. Follow-up with primary physician in 4 to 6 weeks.   Sumatriptn para migraas. Resonancia magntica del cerebro. Remisin a Neurologa. Seguimiento con el mdico de cabecera en 4 a 6 semanas.  Cefalea migraosa Migraine Headache Una cefalea migraosa es un dolor muy intenso y punzante en uno o ambos lados de la cabeza. Este tipo de dolor de cabeza tambin puede causar otros sntomas. Puede durar desde 4horas hasta 3das. Hable con su mdico sobre las cosas que pueden causar (desencadenar) esta afeccin. Cules son las causas? Se desconoce la causa exacta de esta afeccin. Esta afeccin puede desencadenarse o ser causada por lo siguiente:  Consumo de alcohol.  Consumo de cigarrillos.  Tomar medicamentos como por ejemplo: ? Medicamentos para Engineer, materials torcico (nitroglicerina). ? Anticonceptivos orales. ? Estrgeno. ? Algunos medicamentos para la presin arterial.  Comer o beber ciertos productos.  Hacer actividad fsica. Otros factores que pueden provocar cefalea migraosa son los siguientes:  Tener el perodo menstrual.  Vanetta Mulders.  Hambre.  Estrs.  No dormir lo suficiente o dormir demasiado.  Cambios climticos.  Cansancio (fatiga). Qu incrementa el riesgo?  Tener entre 25 y 55aos de edad.  Ser mujer.  Tener antecedentes familiares de cefalea migraosa.  Ser de Engineer, manufacturing.  Tener depresin o ansiedad.  Tener mucho sobrepeso. Cules son los signos o los sntomas?  Un dolor punzante. Este dolor puede [employer] siguientes caractersticas: ? Software engineer en cualquier regin de la cabeza, tanto de un lado como de [place]. ? Puede dificultar las actividades cotidianas. ? Puede empeorar con la actividad fsica. ? Puede empeorar con las luces brillantes o los ruidos fuertes.  Otros sntomas pueden incluir: ? Ganas de vomitar (nuseas). ? Vmitos. ? Mareos. ? Sensibilidad a las  luces brillantes, los ruidos fuertes o [employer].  Antes de tener una cefalea migraosa, puede recibir seales de advertencia (aura). Un aura puede incluir: ? Ver luces intermitentes o tener puntos ciegos. ? Ver puntos brillantes, halos o lneas en zigzag. ? Tener una visin en tnel o visin borrosa. ? Sentir entumecimiento u hormigueo. ? Tener dificultad para hablar. ? Tener msculos dbiles.  Algunas personas tienen sntomas despus de una cefalea migraosa (fase posdromal), como los siguientes: ? Cansancio. ? Dificultad para pensar (concentrarse). Cmo se trata?  Tomar medicamentos para: ? Engineer, materials. ? Aliviar la sensacin de Programme researcher, broadcasting/film/video. ? Prevenir las Soil scientist.  El tratamiento tambin puede incluir lo siguiente: ? Tomar sesiones de acupuntura. ? Evitar los alimentos que provocan las cefaleas migraosas. ? Aprender Duaine Dredge de controlar las funciones corporales (biorretroalimentacin). ? Terapia para ayudarlo a Solicitor y lidiar con los pensamientos negativos (terapia cognitivo conductual). Siga estas instrucciones en su casa: Medicamentos  [employer] de venta libre y los recetados solamente como se lo haya indicado el mdico.  Consulte a su mdico si el medicamento que le recetaron: ? Hace que sea necesario que evite conducir o usar maquinaria pesada. ? Puede causarle dificultad para defecar (estreimiento). Es posible que deba tomar estas medidas para prevenir o tratar los problemas para defecar:  Product manager suficiente lquido para Radio producer pis (la orina) de color amarillo plido.  Tomar medicamentos recetados o de [employer].  Comer alimentos ricos en fibra. Entre ellos, frijoles, cereales integrales y frutas y verduras frescas.  Limitar los alimentos con alto contenido de grasa y International aid/development worker. Estos incluyen alimentos fritos o dulces. Estilo de vida  No beba alcohol.  No consuma ningn  producto que contenga nicotina o tabaco, como  cigarrillos, cigarrillos electrnicos y tabaco de Theatre manager. Si necesita ayuda para dejar de fumar, consulte al mdico.  Duerma como mnimo 8horas todas las noches.  Limite el estrs y [place]. Indicaciones generales      Lleve un registro diario para Financial risk analyst lo que Advice worker. Registre, por ejemplo, lo siguiente: ? Lo que usted come y bebe. ? El tiempo que duerme. ? Algn cambio en lo que come o bebe. ? Algn cambio en sus medicamentos.  Si tiene una cefalea migraosa: ? Evite los factores que [employer] sntomas, como las luces brillantes. ? Resulta til acostarse en una habitacin oscura y silenciosa. ? No conduzca vehculos ni opere maquinaria pesada. ? Pregntele al mdico qu actividades son seguras para usted.  Concurra a todas las visitas de [address] se lo haya indicado el mdico. Esto es importante. Comunquese con un mdico si:  Tiene una cefalea migraosa que es diferente o peor que otras que ha tenido.  Tiene ms de [address] de cefalea por mes. Solicite ayuda inmediatamente si:  La cefalea migraosa [employer].  La cefalea migraosa dura ms de 72 horas.  Tiene fiebre.  Presenta rigidez en el cuello.  Tiene dificultad para ver.  Siente debilidad en los msculos o que no puede controlarlos.  Comienza a perder el equilibrio continuamente.  Comienza a tener dificultad para caminar.  Pierde el conocimiento (se desmaya).  Tiene una convulsin. Resumen  [place] cefalea migraosa es un dolor muy intenso y punzante en uno o ambos lados de la cabeza. Estos dolores de [place] tambin pueden causar otros sntomas.  Esta afeccin puede tratarse con medicamentos y cambios en el estilo de vida.  Lleve un registro diario para Financial risk analyst lo que Advice worker.  Comunquese con un mdico si tiene una cefalea migraosa que es diferente o peor que otras que ha tenido.  Comunquese con el mdico si tiene ms  de 15 das de cefalea en un mes. Esta informacin no tiene Theme park manager el consejo del mdico. Asegrese de hacerle al mdico cualquier pregunta que tenga. Document Revised: 10/23/2018 Document Reviewed: 10/23/2018 Elsevier Patient Education  2020 [employer].

## 2020-06-22 ENCOUNTER — Other Ambulatory Visit: Payer: Self-pay

## 2020-06-22 DIAGNOSIS — Z1231 Encounter for screening mammogram for malignant neoplasm of breast: Secondary | ICD-10-CM

## 2020-07-06 ENCOUNTER — Other Ambulatory Visit: Payer: Self-pay

## 2020-07-06 ENCOUNTER — Ambulatory Visit: Payer: Self-pay | Attending: Family Medicine

## 2020-07-06 MED FILL — SUMATRIPTAN SUCC 50 MG TAB: 50 | 30 days supply | Qty: 10 | Fill #0

## 2020-07-24 ENCOUNTER — Encounter: Payer: Self-pay | Admitting: Family

## 2020-07-24 ENCOUNTER — Other Ambulatory Visit: Payer: Self-pay

## 2020-07-24 ENCOUNTER — Other Ambulatory Visit: Payer: Self-pay | Admitting: Family

## 2020-07-24 ENCOUNTER — Ambulatory Visit: Payer: Self-pay | Attending: Family | Admitting: Family

## 2020-07-24 VITALS — BP 118/73 | HR 82 | Temp 98.4°F | Resp 20 | Wt 159.0 lb

## 2020-07-24 DIAGNOSIS — Z23 Encounter for immunization: Secondary | ICD-10-CM

## 2020-07-24 DIAGNOSIS — J302 Other seasonal allergic rhinitis: Secondary | ICD-10-CM

## 2020-07-24 DIAGNOSIS — Z789 Other specified health status: Secondary | ICD-10-CM

## 2020-07-24 MED ORDER — OLOPATADINE HCL 0.1 % OP SOLN: 1.0000 [drp] | Freq: Two times a day (BID) | OPHTHALMIC | 0 refills | Status: DC

## 2020-07-24 MED ORDER — CETIRIZINE HCL 10 MG PO TABS: 10.0000 mg | ORAL_TABLET | Freq: Every day | ORAL | 1 refills | Status: DC

## 2020-07-24 MED ORDER — FLUTICASONE PROPIONATE 50 MCG/ACT NA SUSP: 2.0000 | Freq: Every day | NASAL | 0 refills | Status: DC

## 2020-07-24 MED FILL — OLOPATADINE HCL 0.1 % SOLN: 0.1 | 37 days supply | Qty: 5 | Fill #0

## 2020-07-24 MED FILL — FLUTICASONE PROP 50 MCG SPR: 50 | 30 days supply | Qty: 16 | Fill #0

## 2020-07-24 NOTE — Progress Notes (Signed)
Has sore throat in the morning and eyes itching.  Tried OTC medication from her country

## 2020-07-24 NOTE — Patient Instructions (Addendum)
   Eye drops for itching eyes.  Fluticasone for stuffy nose.    Cetirizine for allergy symptoms.   Follow-up in 4 to 6 weeks or sooner if needed.   Flu vaccine today.    Gotas para los ojos para la [employer] ojos.  Fluticasona para la congestin nasal.  Cetirizina para los sntomas de [place].  Seguimiento en 4 a 6 semanas o antes si es necesario.  Vacuna contra la gripe hoy.  Alergias en los adultos Allergies, Adult Una alergia implica la reaccin del organismo ante algo que lo molesta (alrgeno). No se trata de una reaccin normal. Puede ocurrir por algo que usted:  Comi.  Inhal.  Toc. Se pueden tener Environmental consultant (ser alrgico) a:  Cosas que estn al [employer], por ejemplo: ? Polen. ? Csped. ? Hierbas.  Cosas que estn en interiores, por ejemplo: ? Polvo. ? Humo. ? Caspa de las [employer]  Alimentos.  Medicamentos.  Cosas que provocan molestias en la piel, por ejemplo: ? Detergentes. ? Productos qumicos. ? Ltex.  Perfume.  Insectos. Las alergias no se propagan de Neomia Dear persona a Theodoro Clock (no son contagiosas). Siga estas indicaciones en su casa:         Si conoce los factores desencadenantes de su alergia, mantngase alejado de ellos.  Si tiene Environmental consultant a cosas que hay en el aire, lvese la [employer]. Hgalo con cualquiera de estas opciones: ? Un atomizador de agua salada (solucin salina). ? Un envase (lavado nasal).  Tome los medicamentos de venta libre y los recetados solamente como se lo haya indicado el mdico.  Oceanographer a todas las visitas de control como se lo haya indicado el mdico. Esto es importante.  Si tiene riesgo de sufrir una reaccin alrgica muy grave (anafilaxia), tenga a Paediatric nurse en todo momento. Esto se llama inyeccin de epinefrina. ? Es un medicamento medido previamente que viene con [place]. Puede colocrselo en la piel usted mismo. ? Despus de Runner, broadcasting/film/video muy grave, usted o alguna  otra persona debe aplicar el medicamento en menos de unos minutos. Esto es Radio broadcast assistant.  Si alguna vez ha tenido Runner, broadcasting/film/video muy grave, debe usar un collar o brazalete de Secondary school teacher. En el collar o el brazalete, debe estar anotada la alergia que usted tiene. Comunquese con un mdico si:  Los sntomas no mejoran con Scientist, research (medical). Solicite ayuda de inmediato si:  Tiene sntomas de una reaccin alrgica muy grave. Estos incluyen lo siguiente: ? Boca, lengua o garganta hinchadas. ? Dolor u opresin en el pecho. ? Problemas para respirar. ? Falta de aire. ? Mareos. ? Desmayos. ? Tiene dolor muy intenso en el vientre (abdomen). ? Vmitos. ? Deposiciones lquidas (diarrea). Resumen  Una alergia implica la reaccin del organismo ante algo que lo molesta (alrgeno). No se trata de una reaccin normal.  Mantngase alejado de los factores desencadenantes de su alergia.  [employer] medicamentos de venta libre y los recetados solamente como se lo haya indicado el mdico.  Si tiene riesgo de sufrir una reaccin alrgica muy grave, tenga a mano un autoinyector en todo momento (inyeccin de epinefrina). Adems, use un collar o brazalete de alerta mdica, para que todos sepan que usted es Best boy. Esta informacin no tiene Theme park manager el consejo del mdico. Asegrese de hacerle al mdico cualquier pregunta que tenga. Document Revised: 11/05/2017 Document Reviewed: 02/04/2017 Elsevier Patient Education  2020 [employer].

## 2020-07-24 NOTE — Progress Notes (Signed)
Patient ID: Cheryl Espinoza, female    DOB: May 03, 1976  MRN: [id number]  CC: Allergies  Subjective: Cheryl Espinoza is a 44 y.o. 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  1. ALLERGIES: Duration: began 2 days ago and has had similar issues before especially when the seasons turn cold, usually worse in the mornings Runny nose: yes "clear Nasal congestion: no Nasal itching: yes Sneezing: yes Eye swelling, itching or discharge: yes itching, denies changes in vision Post nasal drip: no Cough: no Sinus pressure: no  Ear pain: no  Ear pressure: no  Fever: no  Symptoms occur seasonally: yes Symptoms occur perenially: no Allergist evaluation in past: no Recurrent sinus infections: no ENT evaluation in past: no Known environmental allergy: no Indoor pets: no History of asthma: no Current allergy medications: reports taking medication from her home country  Patient Active Problem List   Diagnosis Date Noted  . Screening breast examination 05/11/2019  . LGSIL on Pap smear of cervix 05/11/2019  . Breast lump on right side at 11 o'clock position 05/11/2019  . Breast lump on left side at 9 o'clock position 05/11/2019  . Low grade squamous intraepith lesion on cytologic smear cervix (lgsil) 09/03/2017  . Family history of diabetes mellitus in father 02/25/2017  . Anemia 11/28/2016  . Pilonidal cyst without abscess 05/03/2016  . Vitamin D deficiency 03/16/2015  . Allergic rhinitis 03/15/2015  . Tinea pedis 03/15/2015  . Thickened endometrium 09/22/2014  . Dysmenorrhea 07/14/2014  . Tension headache 07/14/2014  . GERD (gastroesophageal reflux disease) 07/14/2014     Current Outpatient Medications on File Prior to Visit  Medication Sig Dispense Refill  .  ibuprofen (ADVIL,MOTRIN) 600 MG tablet Take 1 tablet (600 mg total) by mouth every 8 (eight) hours as needed (for chest wall pain; take after eating). 30 tablet 0  . SUMAtriptan (IMITREX) 50 MG tablet 50 mg (1 tab) by mouth at the start of the headache. May repeat in 2 hours x 1 if headache persists. Max of 2 tabs/24 hours 10 tablet 2   No current facility-administered medications on file prior to visit.    No Known Allergies  Social History   Socioeconomic History  . Marital status: Single    Spouse name: Not on file  . Number of children: 3   . Years of education: 9   . Highest education level: 9th grade  Occupational History  . Occupation: Restaurant Prep   Tobacco Use  . Smoking status: Never Smoker  . Smokeless tobacco: Never Used  Vaping Use  . Vaping Use: Never used  Substance and Sexual Activity  . Alcohol use: No    Alcohol/week: 0.0 standard drinks  . Drug use: No  . Sexual activity: Not Currently    Birth control/protection: None  Other Topics Concern  . Not on file  Social History Narrative   Lives at home with daughter.    47 yo daughter.    Two younger children [place] in [place]    Lived in [place] since 2004.    Social Determinants of Health   Financial Resource Strain:   . Difficulty of Paying Living Expenses: Not on file  Food Insecurity:   . Worried About Programme researcher, broadcasting/film/video in the Last Year: Not on file  . Ran Out of Food  in the Last Year: Not on file  Transportation Needs:   . Lack of Transportation (Medical): Not on file  . Lack of Transportation (Non-Medical): Not on file  Physical Activity:   . Days of Exercise per Week: Not on file  . Minutes of Exercise per Session: Not on file  Stress:   . Feeling of Stress : Not on file  Social Connections:   . Frequency of Communication with Friends and Family: Not on file  . Frequency of Social Gatherings with Friends and Family: Not on file  . Attends Religious Services: Not on file  . Active Member  of Clubs or Organizations: Not on file  . Attends Banker Meetings: Not on file  . Marital Status: Not on file  Intimate Partner Violence:   . Fear of Current or Ex-Partner: Not on file  . Emotionally Abused: Not on file  . Physically Abused: Not on file  . Sexually Abused: Not on file    Family History  Problem Relation Age of Onset  . Hyperlipidemia Mother   . Diabetes Father   . Diabetes Sister   . Cancer Neg Hx   . Heart disease Neg Hx     Past Surgical History:  Procedure Laterality Date  . CESAREAN SECTION  2000, 2002     ROS: Review of Systems Negative except as stated above  PHYSICAL EXAM: BP 118/73   Pulse 82   Temp 98.4 F (36.9 C)   Resp 20   Wt 159 lb (72.1 kg)   LMP 07/05/2020 (Approximate)   SpO2 99%   BMI 29.08 kg/m   Wt Readings from Last 3 Encounters:  07/24/20 159 lb (72.1 kg)  06/08/20 162 lb 14.7 oz (73.9 kg)  05/18/20 163 lb (73.9 kg)   Physical Exam Constitutional:      Appearance: Normal appearance.  HENT:     Head: Normocephalic.     Right Ear: Tympanic membrane, ear canal and external ear normal.     Left Ear: Tympanic membrane, ear canal and external ear normal.     Nose: Nose normal.     Mouth/Throat:     Mouth: Mucous membranes are moist.  Eyes:     Extraocular Movements: Extraocular movements intact.     Conjunctiva/sclera: Conjunctivae normal.     Pupils: Pupils are equal, round, and reactive to light.     Comments: Skin underneath bilateral eyes mildly pink with appearance of irritation from rubbing itching eyes.  Cardiovascular:     Rate and Rhythm: Normal rate and regular rhythm.     Pulses: Normal pulses.     Heart sounds: Normal heart sounds.  Pulmonary:     Effort: Pulmonary effort is normal.     Breath sounds: Normal breath sounds.  Musculoskeletal:     Cervical back: Normal range of motion and neck supple.  Neurological:     General: No focal deficit present.     Mental Status: She is alert and  oriented to person, place, and time.  Psychiatric:        Mood and Affect: Mood normal.        Behavior: Behavior normal.    ASSESSMENT AND PLAN: 1. Seasonal allergies: - Chronic as patient has this issue for several years and seems aggravated by cold temperatures/changes in the seasons. - Cetirizine for allergy related symptoms. - Fluticasone for nasal congestion.  - Olopatadine for bilateral pruritic eyes. - Follow-up with primary physician in 4 to 6 weeks or sooner  if needed . - cetirizine (ZYRTEC) 10 MG tablet; Take 1 tablet (10 mg total) by mouth daily.  Dispense: 30 tablet; Refill: 1 - fluticasone (FLONASE) 50 MCG/ACT nasal spray; Place 2 sprays into both nostrils daily.  Dispense: 16 g; Refill: 0 - olopatadine (PATANOL) 0.1 % ophthalmic solution; Place 1 drop into both eyes 2 (two) times daily.  Dispense: 5 mL; Refill: 0  2. Need for immunization against influenza: - Influenza vaccine administered today in clinic.  - Flu Vaccine QUAD 36+ mos IM  3. Language barrier: - [employer] participated during today's visit.  - Interpreter Name: Madaline Guthrie, ID#: [id number].   Patient was given the opportunity to ask questions.  Patient verbalized understanding of the plan and was able to repeat key elements of the plan. Patient was given clear instructions to go to Emergency Department or return to medical center if symptoms don't improve, worsen, or new problems develop.The patient verbalized understanding.   Orders Placed This Encounter  Procedures  . Flu Vaccine QUAD 36+ mos IM     Requested Prescriptions   Signed Prescriptions Disp Refills  . cetirizine (ZYRTEC) 10 MG tablet 30 tablet 1    Sig: Take 1 tablet (10 mg total) by mouth daily.  . fluticasone (FLONASE) 50 MCG/ACT nasal spray 16 g 0    Sig: Place 2 sprays into both nostrils daily.  Marland Kitchen olopatadine (PATANOL) 0.1 % ophthalmic solution 5 mL 0    Sig: Place 1 drop into both eyes 2 (two) times daily.    No  follow-ups on file.  Rema Fendt, NP

## 2020-07-26 MED FILL — ?CETIRIZINE HCL 10 MG TABLE: 10 | 30 days supply | Qty: 30 | Fill #0

## 2020-08-10 MED FILL — SUMATRIPTAN SUCC 50 MG TAB: 50 | 30 days supply | Qty: 10 | Fill #0

## 2020-08-31 ENCOUNTER — Other Ambulatory Visit: Payer: Self-pay

## 2020-08-31 ENCOUNTER — Ambulatory Visit: Payer: Self-pay | Admitting: *Deleted

## 2020-08-31 ENCOUNTER — Ambulatory Visit: Payer: Self-pay

## 2020-08-31 VITALS — BP 120/72 | Wt 161.5 lb

## 2020-08-31 DIAGNOSIS — N6311 Unspecified lump in the right breast, upper outer quadrant: Secondary | ICD-10-CM

## 2020-08-31 DIAGNOSIS — Z1239 Encounter for other screening for malignant neoplasm of breast: Secondary | ICD-10-CM

## 2020-08-31 DIAGNOSIS — N6310 Unspecified lump in the right breast, unspecified quadrant: Secondary | ICD-10-CM

## 2020-08-31 NOTE — Patient Instructions (Addendum)
Explained breast self awareness with [name]. Patient did not need a Pap smear today due to last Pap smear was 12/23/2019. Let patient know that her next Pap smear is due in April 2022 due to her history of two abnormal Pap smears prior to her last Pap smear. [facility] to [facility] for a diagnostic mammogram. Appointment scheduled Tuesday, June 12, 2021 at 0940. [name] verbalized understanding.  [place], Kathaleen Maser, RN 10:13 AM

## 2020-08-31 NOTE — Progress Notes (Addendum)
[name] is a 45 y.o. female who presents to [facility] clinic today with no complaints.    Pap Smear: Pap smear not completed today. Last Pap smear was 12/23/2019 at [facility] and was normal with negative HPV. Patients has a history of two abnormal Pap smears 04/22/2019 that was LSIL with negative HPV that a colposcopy was completed for follow-up 06/17/2019 that showed CIN-I and 08/29/2017 that was LSIL with positive HPV that a colposcopy was completed 11/24/2017 that was benign. Last three Pap smear and two colposcopy results are in Epic.   Physical exam: Breasts Breasts symmetrical. No skin abnormalities bilateral breasts. No nipple retraction bilateral breasts. No nipple discharge bilateral breasts. No lymphadenopathy. No lumps palpated left breast. Palpated a lump within the right breast between 9 o'clock and 11 o'clock 1 cm from the nipple. No complaints of pain or tenderness on exam.   Pelvic/Bimanual Pap is not indicated today per BCCCP guidelines.   Smoking History: Patient has never smoked.   Patient Navigation: Patient education provided. Access to services provided for patient through [place] program. Spanish interpreter Cheryl Espinoza from [place] provided.   Breast and Cervical Cancer Risk Assessment: Patient does not have family history of breast cancer, known genetic mutations, or radiation treatment to the chest before age 85. Patient does not have history of cervical dysplasia, immunocompromised, or DES exposure in-utero.  Risk Assessment    Risk Scores      08/31/2020 05/11/2019   Last edited by: Cheryl Rutherford, LPN Cheryl Bang H, LPN   5-year risk: 0.3 % 0.3 %   Lifetime risk: 4.6 % 4.7 %          A: BCCCP exam without pap smear No complaints.  P: Referred patient to the [facility] for a diagnostic mammogram. Appointment scheduled Tuesday, June 12, 2021 at 0940.  Cheryl Heidelberg,  RN 08/31/2020 10:13 AM

## 2020-08-31 NOTE — Addendum Note (Signed)
Addended by: Narda Rutherford on: 08/31/2020 01:48 PM   Modules accepted: Orders

## 2020-09-12 ENCOUNTER — Other Ambulatory Visit: Payer: Self-pay

## 2020-09-13 ENCOUNTER — Ambulatory Visit: Payer: Self-pay | Admitting: Physician Assistant

## 2020-09-14 ENCOUNTER — Encounter: Payer: Self-pay | Admitting: Physician Assistant

## 2020-09-14 ENCOUNTER — Other Ambulatory Visit: Payer: Self-pay

## 2020-09-14 ENCOUNTER — Ambulatory Visit: Payer: Self-pay | Attending: Physician Assistant | Admitting: Physician Assistant

## 2020-09-14 ENCOUNTER — Other Ambulatory Visit: Payer: Self-pay | Admitting: Physician Assistant

## 2020-09-14 VITALS — BP 117/77 | HR 91 | Temp 98.4°F | Ht 62.0 in | Wt 161.6 lb

## 2020-09-14 DIAGNOSIS — Z789 Other specified health status: Secondary | ICD-10-CM

## 2020-09-14 DIAGNOSIS — M79671 Pain in right foot: Secondary | ICD-10-CM

## 2020-09-14 DIAGNOSIS — M722 Plantar fascial fibromatosis: Secondary | ICD-10-CM

## 2020-09-14 DIAGNOSIS — M79672 Pain in left foot: Secondary | ICD-10-CM

## 2020-09-14 DIAGNOSIS — Z758 Other problems related to medical facilities and other health care: Secondary | ICD-10-CM

## 2020-09-14 MED ORDER — MELOXICAM 15 MG PO TABS: 15.0000 mg | ORAL_TABLET | Freq: Every day | ORAL | 2 refills | Status: DC

## 2020-09-14 MED FILL — MELOXICAM 15 MG TABLET: 15 | 30 days supply | Qty: 30 | Fill #0

## 2020-09-14 NOTE — Patient Instructions (Signed)
Fascitis plantar Plantar Fasciitis  La fascitis plantar es una afeccin dolorosa que se produce en el taln. Ocurre cuando la banda de tejido que [employer] dedos con el hueso del taln (fascia plantar) se irrita. Esto puede ocurrir por Academic librarian ejercicio u otras actividades repetitivas (lesin por uso excesivo). La fascitis plantar puede causar desde una leve irritacin hasta dolor intenso que dificulta que la persona camine o se [place]. Por lo general, el dolor es peor a la maana despus de dormir, o despus de Personal assistant sentado o acostado durante un perodo de [place]. El dolor tambin puede empeorar despus de caminar o estar de pie por [employer]. Cules son las causas? Esta afeccin puede ser causada por lo siguiente:  Estar de pie durante largos perodos.  Usar zapatos que no tengan un buen soporte para el arco.  Realizar actividades que implican esfuerzo para las articulaciones (actividades de alto impacto). Esto incluye el ballet y la actividad fsica que hace que el corazn lata ms rpido (ejercicio aerbico), Licensed conveyancer.  Tener sobrepeso.  Tener una forma de caminar (andar) anormal.  Presentar rigidez muscular en la parte posterior de la parte inferior de la pierna (pantorrilla).  Arcos Google o pies planos.  Comenzar una nueva actividad fsica. Cules son los signos o sntomas? El sntoma principal de esta afeccin es el dolor en el taln. El dolor puede empeorar despus de lo siguiente:  Con los primeros pasos luego de estar en reposo, especialmente por la maana despus de dormir o de haber estado sentado o acostado durante un [place].  Largos perodos de Personal assistant de pie. El dolor puede disminuir despus de 30 a 45 minutos de [place], como caminar apaciblemente. Cmo se diagnostica? Esta afeccin se puede diagnosticar en funcin de los antecedentes mdicos, un examen fsico y los sntomas. El mdico controlar lo siguiente:  Un rea dolorida en  la parte inferior del pie.  Arco alto en el pie o pies planos.  Dolor al Doctor, general practice.  Dificultad para mover el pie. Pueden realizarle estudios de diagnstico por imagen para confirmar el diagnstico, por ejemplo:  Radiografas.  Ecografa.  Resonancia magntica (RM). Cmo se trata? El tratamiento de la fascitis plantar depende de la gravedad de su afeccin. El tratamiento puede incluir:  Reposo, hielo, presin (compresin) y Lexicographer (elevar) el pie afectado. Esto se denomina tratamiento de RHCE (reposo, hielo, compresin, elevacin). El mdico puede recomendarle terapia de RHCE junto con medicamentos de venta libre para Engineer, materials.  Ejercicios para estirar las pantorrillas y la fascia plantar.  Una frula que [employer] estirado y [place] mientras usted duerme (frula nocturna).  Fisioterapia para [employer] sntomas y Physiological scientist en el futuro.  Inyecciones de medicamentos con corticoesteroides (cortisona) para Engineer, materials y la inflamacin.  Estimular su fascia plantar lesionada con impulsos elctricos (tratamiento con ondas de choque extracorpreas). Esto generalmente es la ltima opcin de tratamiento antes de la [place].  [place], si los otros tratamientos no han funcionado despus de . Siga estas instrucciones en su casa: Control del dolor, la rigidez y la hinchazn  Si se lo indican, aplique hielo sobre la zona dolorida. Para hacer esto: ? Ponga el hielo en una bolsa de plstico o use una botella de [place]. ? Coloque una [employer] piel y la bolsa de hielo o la botella. ? Frote la parte inferior del pie sobre la bolsa o la botella. ? Haga esto durante , de 2a  3veces al da.  Use calzado deportivo con amortiguacin de aire o gel, o pruebe usar plantillas blandas diseadas para la fascitis plantar.  Cuando est sentado o acostado, eleve el pie por encima del nivel del corazn.   Actividad  Evite las  actividades que le causan dolor. Pregntele al mdico qu actividades son seguras para usted.  Haga los ejercicios de fisioterapia y estiramiento como se lo haya indicado el mdico.  Intente hacer actividades y tipos de ejercicio que sean ms suaves para las articulaciones (de bajo impacto). Por ejemplo, nadar, hacer ejercicios aerbicos en el agua, y andar en bicicleta. Instrucciones generales  Use los medicamentos de venta libre y los recetados solamente como se lo haya indicado el mdico.  Si el mdico se lo indica, use una frula nocturna para dormir. Afloje la frula si los dedos de los pies se le entumecen, siente hormigueos o se le enfran y se tornan de color azul.  Mantenga un peso saludable, o colabore con su mdico para perder peso.  Cumpla con todas las visitas de seguimiento. Esto es importante. Comunquese con un mdico si tiene:  Sntomas que no desaparecen con el tratamiento en casa.  Dolor que empeora.  Dolor que afecta su capacidad de moverse o de realizar sus actividades diarias. Resumen  La fascitis plantar es una afeccin dolorosa que se produce en el taln. Ocurre cuando la banda de tejido que conecta los dedos con el hueso del taln (fascia plantar) se irrita.  El dolor en el taln es el sntoma principal de esta afeccin. Puede empeorar despus de hacer demasiado ejercicio o de permanecer quieto de pie durante mucho tiempo.  El tratamiento vara, pero normalmente comienza con el reposo, la aplicacin de hielo, la aplicacin de presin (compresin) y la elevacin del pie afectado. Esto se denomina tratamiento de RHCE (reposo, hielo, compresin, elevacin). Tambin se pueden usar analgsicos de venta libre para controlar el dolor. Esta informacin no tiene como fin reemplazar el consejo del mdico. Asegrese de hacerle al mdico cualquier pregunta que tenga. Document Revised: 02/14/2020 Document Reviewed: 02/14/2020 Elsevier Patient Education  2021 Elsevier  Inc.  

## 2020-09-14 NOTE — Progress Notes (Signed)
Patient ID: Cheryl Espinoza, female   DOB: September 14, 1975, 45 y.o.   MRN: [id number]   Cheryl Espinoza, is a 45 y.o. female  [id number]:[id number]  [id number]  DOB - 1976/04/14  Subjective:  Chief Complaint and HPI: Cheryl Espinoza is a 45 y.o. female here today fo B foot pain for 1 month.  Pain is worse first thing in the morning and after standing long periods of time-esp at work.  No new job or exercises.    ROS:   Constitutional:  No f/c, No night sweats, No unexplained weight loss. EENT:  No vision changes, No blurry vision, No hearing changes. No mouth, throat, or ear problems.  Respiratory: No cough, No SOB Cardiac: No CP, no palpitations GI:  No abd pain, No N/V/D. GU: No Urinary s/sx Musculoskeletal: see above Neuro: No headache, no dizziness, no motor weakness.  Skin: No rash Endocrine:  No polydipsia. No polyuria.  Psych: Denies SI/HI  No problems updated.  ALLERGIES: No Known Allergies  PAST MEDICAL HISTORY: Past Medical History:  Diagnosis Date  . GERD (gastroesophageal reflux disease)   . History of abnormal cervical Pap smear   . History of vitamin D deficiency   . Tension headache     MEDICATIONS AT HOME: Prior to Admission medications   Medication Sig Start Date End Date Taking? Authorizing Provider  cetirizine (ZYRTEC) 10 MG tablet Take 1 tablet (10 mg total) by mouth daily. 07/24/20  Yes Zonia Kief, Amy J, NP  fluticasone (FLONASE) 50 MCG/ACT nasal spray Place 2 sprays into both nostrils daily. 07/24/20  Yes Zonia Kief, Amy J, NP  ibuprofen (ADVIL,MOTRIN) 600 MG tablet Take 1 tablet (600 mg total) by mouth every 8 (eight) hours as needed (for chest wall pain; take after eating). 04/20/18  Yes [name], [name], MD  meloxicam (MOBIC) 15 MG tablet Take 1 tablet (15 mg total) by mouth daily. X 10 days then prn pain 09/14/20  Yes [name], PA-C  SUMAtriptan (IMITREX) 50 MG tablet 50 mg (1 tab) by mouth at the start of the headache. May  repeat in 2 hours x 1 if headache persists. Max of 2 tabs/24 hours 06/08/20  Yes Zonia Kief, Amy J, NP  olopatadine (PATANOL) 0.1 % ophthalmic solution Place 1 drop into both eyes 2 (two) times daily. Patient not taking: Reported on 09/14/2020 07/24/20   Rema Fendt, NP     Objective:  EXAM:   Vitals:   09/14/20 0923  BP: 117/77  Pulse: 91  Temp: 98.4 F (36.9 C)  TempSrc: Oral  SpO2: 91%  Weight: 161 lb 9.6 oz (73.3 kg)  Height: 5\' 2"  (1.575 m)    General appearance : A&OX3. NAD. Non-toxic-appearing HEENT: Atraumatic and Normocephalic.  PERRLA. EOM intact.  Chest/Lungs:  Breathing-non-labored, Good air entry bilaterally, breath sounds normal without rales, rhonchi, or wheezing  CVS: S1 S2 regular, no murmurs, gallops, rubs  Extremities: Bilateral Lower Ext shows no edema, both legs are warm to touch with = pulse throughout.  B feet examined-no erythema or swelling.  TTP B along plantar fascia. Neurology:  CN II-XII grossly intact, Non focal.   Psych:  TP linear. J/I WNL. Normal speech. Appropriate eye contact and affect.  Skin:  No Rash  Data Review Lab Results  Component Value Date   HGBA1C 5.5 01/13/2020   HGBA1C 5.7 11/26/2016     Assessment & Plan   1. Pain in both feet - Ambulatory referral to Podiatry - meloxicam (MOBIC) 15 MG tablet; Take  1 tablet (15 mg total) by mouth daily. X 10 days then prn pain  Dispense: 30 tablet; Refill: 2  2. Plantar fasciitis Stretches reviewed and patient education provided - Ambulatory referral to Podiatry - meloxicam (MOBIC) 15 MG tablet; Take 1 tablet (15 mg total) by mouth daily. X 10 days then prn pain  Dispense: 30 tablet; Refill: 2  3. Language barrier [employer]) interpreters used and additional time performing visit was required.   Patient have been counseled extensively about nutrition and exercise  Return if symptoms worsen or fail to improve-assign PCP please.  The patient was given clear instructions to go to  ER or return to medical center if symptoms don't improve, worsen or new problems develop. The patient verbalized understanding. The patient was told to call to get lab results if they haven't heard anything in the next week.     Georgian Co, PA-C [facility] [place], [place] [phone]   09/14/2020, 9:54 AM

## 2020-09-22 ENCOUNTER — Ambulatory Visit
Admission: RE | Admit: 2020-09-22 | Discharge: 2020-09-22 | Disposition: A | Discharge status: Home / Self Care | Payer: No Typology Code available for payment source | Source: Ambulatory Visit | Attending: Obstetrics and Gynecology | Admitting: Obstetrics and Gynecology

## 2020-09-22 ENCOUNTER — Other Ambulatory Visit: Payer: Self-pay

## 2020-09-22 DIAGNOSIS — N6310 Unspecified lump in the right breast, unspecified quadrant: Secondary | ICD-10-CM

## 2020-09-25 ENCOUNTER — Encounter: Payer: Self-pay | Admitting: Podiatry

## 2020-09-25 ENCOUNTER — Ambulatory Visit (INDEPENDENT_AMBULATORY_CARE_PROVIDER_SITE_OTHER): Payer: No Typology Code available for payment source | Admitting: Podiatry

## 2020-09-25 ENCOUNTER — Other Ambulatory Visit: Payer: Self-pay | Admitting: Podiatry

## 2020-09-25 ENCOUNTER — Other Ambulatory Visit: Payer: Self-pay

## 2020-09-25 ENCOUNTER — Ambulatory Visit (INDEPENDENT_AMBULATORY_CARE_PROVIDER_SITE_OTHER): Payer: No Typology Code available for payment source

## 2020-09-25 DIAGNOSIS — M79671 Pain in right foot: Secondary | ICD-10-CM

## 2020-09-25 DIAGNOSIS — M79672 Pain in left foot: Secondary | ICD-10-CM

## 2020-09-25 DIAGNOSIS — M722 Plantar fascial fibromatosis: Secondary | ICD-10-CM

## 2020-09-25 MED ORDER — DEXAMETHASONE SODIUM PHOSPHATE 4 MG/ML IJ SOLN
8.0000 mg | Freq: Once | INTRAMUSCULAR | Status: AC
  Administered 2020-09-25: 8 mg

## 2020-09-25 MED ORDER — MELOXICAM 15 MG PO TABS: 15.0000 mg | ORAL_TABLET | Freq: Every day | ORAL | 2 refills | Status: DC

## 2020-09-25 MED ORDER — TRIAMCINOLONE ACETONIDE 40 MG/ML IJ SUSP
20.0000 mg | Freq: Once | INTRAMUSCULAR | Status: AC
  Administered 2020-09-25: 20 mg

## 2020-09-25 NOTE — Progress Notes (Signed)
  Subjective:  Patient ID: Cheryl Espinoza, female    DOB: [birth year],  MRN: [id number]  Chief Complaint  Patient presents with  . Foot Pain    Bilateral foot pain. PT stated that she has a lot of pain on the heel and lateral side of her foot. She stated that it is like a burning sensation.    45 y.o. female presents with the above complaint. History confirmed with patient. Translation provided in person from [place] to [place].  Objective:  Physical Exam: warm, good capillary refill, no trophic changes or ulcerative lesions, normal DP and PT pulses and normal sensory exam. Left Foot: point tenderness over the heel pad  Right Foot: point tenderness over the heel pad   No images are attached to the encounter.  Radiographs: X-ray of both feet: no fracture, dislocation, swelling or degenerative changes noted and plantar calcaneal spur Assessment:   1. Plantar fasciitis, bilateral   2. Pain in both feet   3. Plantar fasciitis      Plan:  Patient was evaluated and treated and all questions answered.  Discussed the etiology and treatment options for plantar fasciitis including stretching, formal physical therapy, supportive shoegears such as a running shoe or sneaker, pre fabricated orthoses, injection therapy, and oral medications. We also discussed the role of surgical treatment of this for patients who do not improve after exhausting non-surgical treatment options.   Plantar Fasciitis -XR reviewed with patient -Educated patient on stretching and icing of the affected limb -Injection delivered to the plantar fascia of both feet. -Rx for meloxicam. Educated on use, risks and benefits of the medication  After sterile prep with povidone-iodine solution and alcohol, the bilateral heel was injected with 1cc 0.5% marcaine plain, 10mg  triamcinolone acetonide, and 4mg  dexamethasone was injected along  the plantar fascia at the insertion on the plantar calcaneus. The patient  tolerated the procedure well without complication.  Return in about 1 year (around 09/25/2021) for recheck plantar fasciitis.

## 2020-09-25 NOTE — Patient Instructions (Addendum)
Fascitis plantar Plantar Fasciitis  La fascitis plantar es una afeccin dolorosa que se produce en el taln. Ocurre cuando la banda de tejido que [employer] dedos con el hueso del taln (fascia plantar) se irrita. Esto puede ocurrir por Academic librarian ejercicio u otras actividades repetitivas (lesin por uso excesivo). La fascitis plantar puede causar desde una leve irritacin hasta dolor intenso que dificulta que la persona camine o se [place]. Por lo general, el dolor es peor a la maana despus de dormir, o despus de Personal assistant sentado o acostado durante un perodo de [place]. El dolor tambin puede empeorar despus de caminar o estar de pie por [employer]. Cules son las causas? Esta afeccin puede ser causada por lo siguiente:  Estar de pie durante largos perodos.  Usar zapatos que no tengan un buen soporte para el arco.  Realizar actividades que implican esfuerzo para las articulaciones (actividades de alto impacto). Esto incluye el ballet y la actividad fsica que hace que el corazn lata ms rpido (ejercicio aerbico), Licensed conveyancer.  Tener sobrepeso.  Tener una forma de caminar (andar) anormal.  Presentar rigidez muscular en la parte posterior de la parte inferior de la pierna (pantorrilla).  Arcos Google o pies planos.  Comenzar una nueva actividad fsica. Cules son los signos o sntomas? El sntoma principal de esta afeccin es el dolor en el taln. El dolor puede empeorar despus de lo siguiente:  Con los primeros pasos luego de estar en reposo, especialmente por la maana despus de dormir o de haber estado sentado o acostado durante un [place].  Largos perodos de Personal assistant de pie. El dolor puede disminuir despus de 30 a 45 minutos de [place], como caminar apaciblemente. Cmo se diagnostica? Esta afeccin se puede diagnosticar en funcin de los antecedentes mdicos, un examen fsico y los sntomas. El mdico controlar lo siguiente:  Un rea dolorida en  la parte inferior del pie.  Arco alto en el pie o pies planos.  Dolor al Doctor, general practice.  Dificultad para mover el pie. Pueden realizarle estudios de diagnstico por imagen para confirmar el diagnstico, por ejemplo:  Radiografas.  Ecografa.  Resonancia magntica (RM). Cmo se trata? El tratamiento de la fascitis plantar depende de la gravedad de su afeccin. El tratamiento puede incluir:  Reposo, hielo, presin (compresin) y Lexicographer (elevar) el pie afectado. Esto se denomina tratamiento de RHCE (reposo, hielo, compresin, elevacin). El mdico puede recomendarle terapia de RHCE junto con medicamentos de venta libre para Engineer, materials.  Ejercicios para estirar las pantorrillas y la fascia plantar.  Una frula que [employer] estirado y [place] mientras usted duerme (frula nocturna).  Fisioterapia para [employer] sntomas y Physiological scientist en el futuro.  Inyecciones de medicamentos con corticoesteroides (cortisona) para Engineer, materials y la inflamacin.  Estimular su fascia plantar lesionada con impulsos elctricos (tratamiento con ondas de choque extracorpreas). Esto generalmente es la ltima opcin de tratamiento antes de la [place].  [place], si los otros tratamientos no han funcionado despus de . Siga estas instrucciones en su casa: Control del dolor, la rigidez y la hinchazn  Si se lo indican, aplique hielo sobre la zona dolorida. Para hacer esto: ? Ponga el hielo en una bolsa de plstico o use una botella de [place]. ? Coloque una [employer] piel y la bolsa de hielo o la botella. ? Frote la parte inferior del pie sobre la bolsa o la botella. ? Haga esto durante , de 2a  3veces al da.  Use calzado deportivo con amortiguacin de aire o gel, o pruebe usar plantillas blandas diseadas para la fascitis plantar.  Cuando est sentado o acostado, eleve el pie por encima del nivel del corazn.   Actividad  Evite las  actividades que le causan dolor. Pregntele al mdico qu actividades son seguras para usted.  Haga los ejercicios de fisioterapia y estiramiento como se lo haya indicado el mdico.  Intente hacer actividades y tipos de ejercicio que sean ms suaves para las articulaciones (de bajo impacto). Por ejemplo, nadar, hacer ejercicios [employer], y Lobbyist. Instrucciones generales  Use los medicamentos de venta libre y los recetados solamente como se lo haya indicado el mdico.  Si el mdico se lo indica, use una frula nocturna para dormir. Afloje la frula si los dedos de los pies se le entumecen, siente hormigueos o se le enfran y se tornan de Research officer, trade union.  Mantenga un peso saludable, o colabore con su mdico para perder [employer].  Cumpla con todas las visitas de seguimiento. Esto es importante. Comunquese con un mdico si tiene:  Sntomas que no desaparecen con Environmental consultant.  Dolor que [place].  Dolor que afecta su capacidad de moverse o de Education officer, environmental sus actividades diarias. Resumen  La fascitis plantar es una afeccin dolorosa que se produce en el taln. Ocurre cuando la banda de tejido que [employer] dedos con el hueso del taln (fascia plantar) se irrita.  El dolor en el taln es el sntoma principal de esta afeccin. Puede empeorar despus de Research scientist (medical) ejercicio o de Personal assistant quieto de pie durante mucho tiempo.  El tratamiento vara, pero normalmente comienza con el reposo, la aplicacin de hielo, la aplicacin de presin (compresin) y la elevacin del pie afectado. Esto se denomina tratamiento de RHCE (reposo, hielo, compresin, elevacin). Tambin se pueden usar analgsicos de venta libre para Human resources officer. Esta informacin no tiene Theme park manager el consejo del mdico. Asegrese de hacerle al mdico cualquier pregunta que tenga. Document Revised: 02/14/2020 Document Reviewed: 02/14/2020 Elsevier Patient Education  2021 Elsevier  Inc.  Fascitis plantar, rehabilitacin Plantar Fasciitis Rehab Pregunte al mdico qu ejercicios son seguros para usted. Haga los ejercicios exactamente como se lo haya indicado el mdico y gradelos como se lo hayan indicado. Es normal sentir un estiramiento leve, tironeo, opresin o Dentist al [employer] ejercicios. Detngase de inmediato si siente un dolor repentino o Community education officer. No comience a hacer estos ejercicios hasta que se lo indique el mdico. Ejercicios de elongacin y amplitud de movimiento Estos ejercicios calientan los msculos y las articulaciones, y mejoran la movilidad y la flexibilidad del pie. Adems, ayudan a Engineer, materials. Estiramiento de la fascia plantar 1. Sintese con la pierna izquierda/derecha cruzada sobre la rodilla opuesta. 2. Sostenga el taln con Edison Simon, con el pulgar cerca del arco. Con la otra mano, sostenga los dedos de los pies y empjelos con [place]. Debe sentir un estiramiento en la base (la parte de abajo) de los dedos o en la parte de abajo del pie (fascia plantar), o en ambos. 3. Mantenga esta posicin durante 10 segundos. 4. Afloje lentamente los dedos y vuelva a la posicin inicial. Repita 10 veces. Realice este ejercicio 2 veces al da.   Estiramiento de los gemelos, de pie Este ejercicio tambin se denomina estiramiento de la pantorrilla (los msculos gemelos). Estira los msculos posteriores de la parte superior de la pantorrilla. 1. Prese  con las [employer] pared. 2. Extienda la pierna izquierda/derecha hacia atrs y flexione ligeramente la rodilla de la pierna de adelante. 3. Mantenga los talones apoyados en el suelo, los dedos apuntando hacia delante y la rodilla de atrs extendida, y lleve el peso hacia la pared. No arquee la espalda. Debe sentir un ligero estiramiento en la parte superior de la pantorrilla. 4. Mantenga esta posicin durante 10 segundos. Repita 10 veces. Realice este ejercicio 2 veces al  da.   Estiramiento del msculo sleo, de pie Este ejercicio tambin se denomina estiramiento de la pantorrilla (sleo). Estira los msculos posteriores de la parte inferior de la pantorrilla. 1. Prese con las manos [employer] pared. 2. Extienda la pierna izquierda/derecha hacia atrs y flexione ligeramente la rodilla de la pierna de adelante. 3. Mantenga los talones apoyados en el suelo y los dedos apuntando hacia delante, flexione la rodilla de atrs y lleve el peso ligeramente a la pierna de atrs. Debe sentir un estiramiento suave en la parte profunda de la parte inferior de la pantorrilla. 4. Mantenga esta posicin durante 10 segundos. Repita 10 veces. Realice este ejercicio 2 veces al da. Estiramiento de los [employer] gemelos y sleo, de pie con un escaln Este ejercicio estira los msculos posteriores de la parte inferior de la pierna. Estos msculos se encuentran en la parte superior de la pantorrilla (gastrocnemio) y la parte inferior de la pantorrilla (sleo). 1. Prese delante de un escaln apoyando solo la regin metatarsiana de su pie derecho/izquierdo. La regin metatarsiana del pie es la superficie sobre la que caminamos, justo debajo de los dedos. 2. Mantenga el otro pie apoyado con firmeza en el mismo escaln. 3. Sostngase de la pared o de una baranda para mantener el equilibrio. 4. Levante lentamente el [employer], y permita que el peso del cuerpo presione el taln sobre el borde del frente del escaln. Mantenga la rodilla recta y sin doblar. Debe sentir un estiramiento en la pantorrilla. 5. Mantenga esta posicin durante 10 segundos. 6. Vuelva a poner ambos pies sobre el escaln. 7. Repita este ejercicio con una leve flexin en la rodilla izquierda/derecha. Reptalo 10 veces con la rodilla izquierda/derecha extendida y 10 veces con la rodilla izquierda/derecha flexionada. Realice este ejercicio 2 veces al da. Ejercicio de equilibrio Este ejercicio aumenta el equilibrio y  el control de la fuerza del arco, para ayudar a reducir la presin sobre la fascia plantar. Pararse sobre una pierna Si este ejercicio es muy fcil, puede intentar hacerlo con los ojos cerrados o parado sobre [place]. 1. Sin calzado, prese cerca de una baranda o [place]. Puede sostenerse de la baranda o del marco de la puerta, segn lo necesite. 2. Prese sobre el pie izquierdo/derecho. Sin despegar el dedo gordo del suelo, levante el arco del pie. Debe sentir un estiramiento en la parte de abajo del pie y el arco. No deje que el pie se vaya hacia adentro. 3. Mantenga esta posicin durante 10 segundos. Repita 10 veces. Realice este ejercicio 2 veces al da. Esta informacin no tiene Theme park manager el consejo del mdico. Asegrese de hacerle al mdico cualquier pregunta que tenga. Document Revised: 06/23/2020 Document Reviewed: 06/23/2020 Elsevier Patient Education  2021 [employer].

## 2020-10-09 MED FILL — MELOXICAM 15 MG TABLET: 15 | 30 days supply | Qty: 30 | Fill #1

## 2020-10-09 MED FILL — SUMATRIPTAN SUCC 50 MG TAB: 50 | 30 days supply | Qty: 10 | Fill #1

## 2020-10-26 ENCOUNTER — Other Ambulatory Visit: Payer: Self-pay

## 2020-10-26 ENCOUNTER — Ambulatory Visit: Payer: No Typology Code available for payment source | Admitting: Podiatry

## 2020-10-26 DIAGNOSIS — M21862 Other specified acquired deformities of left lower leg: Secondary | ICD-10-CM

## 2020-10-26 DIAGNOSIS — M216X2 Other acquired deformities of left foot: Secondary | ICD-10-CM

## 2020-10-26 DIAGNOSIS — M722 Plantar fascial fibromatosis: Secondary | ICD-10-CM

## 2020-10-26 DIAGNOSIS — M216X1 Other acquired deformities of right foot: Secondary | ICD-10-CM

## 2020-10-26 DIAGNOSIS — M21861 Other specified acquired deformities of right lower leg: Secondary | ICD-10-CM

## 2020-10-26 NOTE — Patient Instructions (Signed)
Call  [phone] for your physical therapy appointment

## 2020-10-27 ENCOUNTER — Encounter: Payer: Self-pay | Admitting: Podiatry

## 2020-10-27 NOTE — Progress Notes (Signed)
  Subjective:  Patient ID: Cheryl Espinoza, female    DOB: 10-28-1975,  MRN: [id number]  Chief Complaint  Patient presents with  . Plantar Fasciitis    PT stated that she is still having the burning sensation with both feet and it is painful.    45 y.o. female presents with the above complaint. History confirmed with patient. Translation provided in person from [place] to [place].  Has had improvement.  The injection was helpful on both sides.  Objective:  Physical Exam: warm, good capillary refill, no trophic changes or ulcerative lesions, normal DP and PT pulses and normal sensory exam. Left Foot: point tenderness over the heel pad  Right Foot: point tenderness over the heel pad   No images are attached to the encounter.  Radiographs: X-ray of both feet: no fracture, dislocation, swelling or degenerative changes noted and plantar calcaneal spur Assessment:   1. Plantar fasciitis of right foot   2. Plantar fasciitis of left foot   3. Gastrocnemius equinus of left lower extremity   4. Gastrocnemius equinus of right lower extremity      Plan:  Patient was evaluated and treated and all questions answered.  -Injection was very helpful.  At this point I like to see if she is able to continue with physical therapy to heal this -Referral sent to [facility] physical therapy group -Return in 6 weeks for reevaluation -Continue meloxicam -Continue stretching and icing at home as well

## 2020-11-06 ENCOUNTER — Ambulatory Visit: Payer: Self-pay | Attending: Podiatry

## 2020-11-06 ENCOUNTER — Other Ambulatory Visit: Payer: Self-pay

## 2020-11-06 DIAGNOSIS — M25674 Stiffness of right foot, not elsewhere classified: Secondary | ICD-10-CM | POA: Insufficient documentation

## 2020-11-06 DIAGNOSIS — M79671 Pain in right foot: Secondary | ICD-10-CM | POA: Insufficient documentation

## 2020-11-06 DIAGNOSIS — M6281 Muscle weakness (generalized): Secondary | ICD-10-CM | POA: Insufficient documentation

## 2020-11-06 DIAGNOSIS — M79672 Pain in left foot: Secondary | ICD-10-CM | POA: Insufficient documentation

## 2020-11-06 DIAGNOSIS — M25675 Stiffness of left foot, not elsewhere classified: Secondary | ICD-10-CM | POA: Insufficient documentation

## 2020-11-06 NOTE — Patient Instructions (Signed)
Access Code: [email or web address] URL: [email or web address] Date: 11/06/2020 Prepared by: Claude Manges  Exercises Gastroc Stretch on Wall - 2 x daily - 7 x weekly - 2 sets - 30 sec hold Seated Plantar Fascia Stretch - 2 x daily - 7 x weekly - 2 sets - 30 sec hold Ice - 1 x daily - 7 x weekly - 10 minutes hold( using ice bottle) Supine Ankle Dorsiflexion and Plantarflexion AROM - 1 x daily - 7 x weekly - 2 sets - 10 reps Supine Ankle Inversion and Eversion AROM - 1 x daily - 7 x weekly - 2 sets - 10 reps

## 2020-11-06 NOTE — Therapy (Signed)
[facility] [address] [place], [place], [address] Phone: [phone]   Fax:  [phone]  Physical Therapy Evaluation  Patient Details  Name: Cheryl Espinoza MRN: [id number] Date of Birth: [birth year] No data recorded  Encounter Date: 11/06/2020   PT End of Session - 11/06/20 1358    Visit Number 1    Number of Visits 17    Date for PT Re-Evaluation 01/01/21    Authorization Type CAFA    PT Start Time 1317    PT Stop Time 1400    PT Time Calculation (min) 43 min    Activity Tolerance Patient tolerated treatment well;Patient limited by pain    Behavior During Therapy Cheryl Espinoza for tasks assessed/performed           Past Medical History:  Diagnosis Date  . GERD (gastroesophageal reflux disease)   . History of abnormal cervical Pap smear   . History of vitamin D deficiency   . Tension headache     Past Surgical History:  Procedure Laterality Date  . CESAREAN SECTION  2000, 2002     There were no vitals filed for this visit.    Subjective Assessment - 11/06/20 1321    Subjective 2 months ago began plantar fascia pain - burning, warmth on bottom of both feet. Had an injection with some relief. Pain gets worse the more she is on her feet - works long days as a busboy.    Patient is accompained by: Interpreter   Interpreter, Cheryl Espinoza   Pertinent History unremarkable    How long can you walk comfortably? <20 minutes before increasing pain    Patient Stated Goals to decrease pain    Currently in Pain? Yes    Pain Score 5     Pain Location Foot    Pain Orientation Right;Left    Pain Descriptors / Indicators Burning   warmth   Pain Type Acute pain    Pain Onset More than a month ago    Pain Frequency Constant    Aggravating Factors  [place] at work, walking at work - works 11 to 10 and no breaks    Pain Relieving Factors Not standing or walking for long, not much relief from medication              Cheryl Espinoza  PT Assessment - 11/06/20 1325      Assessment   Medical Diagnosis B Plantar fasciitis    Hand Dominance Right    Next MD Visit 12/07/20      Home Environment   Additional Comments Lives with daughter 30 and sons 45 and 39. House. 1 small step to enter, 1 level.      Prior Function   Level of Independence Independent    Vocation Full time employment    [employer]- standing, walking, carrying ( increasaed up on feet especially on weekends)      Cognition   Overall Cognitive Status Within Functional Limits for tasks assessed      ROM / Strength   AROM / PROM / Strength AROM;PROM;Strength      AROM   Overall AROM Comments Ankle DF B:  5 Active, 10 Passive. Rests in 20 deg PF      Strength   Overall Strength Comments Hip flex 4-/5 R 3+/5 L. B knee ext 4+/5. B knee flex 4/5. B ankle DF/PF 4/5.      Flexibility   Soft Tissue Assessment /Muscle Length yes  Gastro-sol mod tightness B   Piriformis mod tightness B      Palpation   Palpation comment slighlty decreased medial arch B, TTP at calcaneal insertion of plantar fascia      Ambulation/Gait   Ambulation/Gait Yes    Gait Pattern Within Functional Limits;Step-through pattern;Decreased dorsiflexion - right;Decreased dorsiflexion - left;Trendelenburg;Antalgic;Decreased stance time - left;Decreased stance time - right               Objective measurements completed on examination: See above findings.       PT Education - 11/06/20 1357    Education Details PT POC, Initial HEP. [email or web address]    Person(s) Educated Patient;Other (comment)    Methods Explanation;Demonstration;Handout    Comprehension Returned demonstration;Verbalized understanding;Need further instruction            PT Short Term Goals - 11/06/20 1744      PT SHORT TERM GOAL #1   Title Independent with inital HEP    Time 2    Period Weeks    Status New    Target Date 11/20/20             PT Long Term Goals - 11/06/20 1757       PT LONG TERM GOAL #1   Title Independent with advanced HEP    Time 8    Period Weeks    Status New    Target Date 01/01/21      PT LONG TERM GOAL #2   Title Pt will demo improved ankle DF AROM to at least 10 deg bilateral without pain    Time 8    Period Weeks    Status New    Target Date 01/01/21      PT LONG TERM GOAL #3   Title Pt will report </= 2/10 foot pain after full day of work    Time 8    Period Weeks    Status New    Target Date 01/01/21      PT LONG TERM GOAL #4   Title BLE strength 4+/5    Time 8    Period Weeks    Status New    Target Date 01/01/21                  Plan - 11/06/20 1359    Clinical Impression Statement Pt is a 45 yo female who presents with 2 month history of plantar heel pain and was referred for physical therapy evaluation for B plantar fasciitis and gastroc equinus. She currently presents with B plantar heel pain, tightness in B gastro/sol and piriformis, BLE muscle weakness (especially with DF, INV and EV as well as hip flexion B), abnormal gait. As a result, Cheryl Espinoza has most difficulty and is limited by pain when she is up on her feet for long periods of time as she must do for work. Cheryl Espinoza would benefit from skilled physical therapy at this time to improve upon the aformenetioned impairements, decrease pain, and improve functional mobility.    Examination-Activity Limitations Locomotion Level    Examination-Participation Restrictions Occupation    Stability/Clinical Decision Making Stable/Uncomplicated    Clinical Decision Making Low    Rehab Potential Good    PT Frequency 2x / week    PT Duration --   6-8 weeks   PT Treatment/Interventions ADLs/Self Care Home Management;Cryotherapy;Electrical Stimulation;Iontophoresis 4mg /ml Dexamethasone;Moist Heat;Neuromuscular re-education;Gait training;Therapeutic exercise;Functional mobility training;Patient/family education;Taping;Vasopneumatic Device;Manual techniques    PT Next Visit Plan  Reassess HEP. progress LE flexibility and  strength as tolerated, manual and modalities as needed    PT Home Exercise Plan see pt edu    Consulted and Agree with Plan of Care Patient           Patient will benefit from skilled therapeutic intervention in order to improve the following deficits and impairments:  Abnormal gait,Decreased range of motion,Difficulty walking,Pain,Improper body mechanics,Impaired flexibility,Hypomobility,Decreased balance,Decreased mobility,Decreased strength,Increased edema  Visit Diagnosis: Pain in left foot - Plan: PT plan of care cert/re-cert  Pain in right foot - Plan: PT plan of care cert/re-cert  Stiffness of left foot, not elsewhere classified - Plan: PT plan of care cert/re-cert  Stiffness of right foot, not elsewhere classified - Plan: PT plan of care cert/re-cert  Muscle weakness (generalized) - Plan: PT plan of care cert/re-cert     Problem List Patient Active Problem List   Diagnosis Date Noted  . Screening breast examination 05/11/2019  . LGSIL on Pap smear of cervix 05/11/2019  . Breast lump on right side at 11 o'clock position 05/11/2019  . Breast lump on left side at 9 o'clock position 05/11/2019  . Low grade squamous intraepith lesion on cytologic smear cervix (lgsil) 09/03/2017  . Family history of diabetes mellitus in father 02/25/2017  . Anemia 11/28/2016  . Pilonidal cyst without abscess 05/03/2016  . Vitamin D deficiency 03/16/2015  . Allergic rhinitis 03/15/2015  . Tinea pedis 03/15/2015  . Thickened endometrium 09/22/2014  . Dysmenorrhea 07/14/2014  . Tension headache 07/14/2014  . GERD (gastroesophageal reflux disease) 07/14/2014    Anson Crofts, PT, DPT 11/06/2020, 6:01 PM  [facility] [address] [place], [place], [address] Phone: [phone]   Fax:  [phone]  Name: Cheryl Espinoza MRN: [id number] Date of Birth: 1975-12-17

## 2020-11-13 ENCOUNTER — Other Ambulatory Visit: Payer: Self-pay

## 2020-11-13 ENCOUNTER — Encounter: Payer: Self-pay | Admitting: Physical Therapy

## 2020-11-13 ENCOUNTER — Ambulatory Visit: Payer: Self-pay | Admitting: Physical Therapy

## 2020-11-13 DIAGNOSIS — M25675 Stiffness of left foot, not elsewhere classified: Secondary | ICD-10-CM

## 2020-11-13 DIAGNOSIS — M6281 Muscle weakness (generalized): Secondary | ICD-10-CM

## 2020-11-13 DIAGNOSIS — M79672 Pain in left foot: Secondary | ICD-10-CM

## 2020-11-13 DIAGNOSIS — M79671 Pain in right foot: Secondary | ICD-10-CM

## 2020-11-13 DIAGNOSIS — M25674 Stiffness of right foot, not elsewhere classified: Secondary | ICD-10-CM

## 2020-11-13 NOTE — Therapy (Signed)
[place]. [place], [place], [address] Phone: [phone]   Fax:  [phone]  Physical Therapy Treatment  Patient Details  Name: Cheryl Espinoza MRN: [id number] Date of Birth: 05/31/1976 No data recorded  Encounter Date: 11/13/2020   PT End of Session - 11/13/20 0958    Visit Number 2    Date for PT Re-Evaluation 01/01/21    Authorization Type CAFA    PT Start Time 0911    PT Stop Time 0955    PT Time Calculation (min) 44 min    Activity Tolerance Patient tolerated treatment well;Patient limited by pain    Behavior During Therapy [facility] for tasks assessed/performed           Past Medical History:  Diagnosis Date  . GERD (gastroesophageal reflux disease)   . History of abnormal cervical Pap smear   . History of vitamin D deficiency   . Tension headache     Past Surgical History:  Procedure Laterality Date  . CESAREAN SECTION  2000, 2002     There were no vitals filed for this visit.   Subjective Assessment - 11/13/20 0914    Subjective I feel a little better, did not work this weekend.  Still sore bottom of the feet.    Currently in Pain? Yes    Pain Score 4     Pain Location Foot    Pain Orientation Right;Left    Aggravating Factors  standing, walking                             [facility] Adult PT Treatment/Exercise - 11/13/20 0001      Exercises   Exercises Ankle      Manual Therapy   Manual Therapy Soft tissue mobilization    Soft tissue mobilization bilateral PF with passive stretch during      Ankle Exercises: Stretches   Plantar Fascia Stretch 3 reps;10 seconds    Soleus Stretch 3 reps;20 seconds    Gastroc Stretch 3 reps;20 seconds      Ankle Exercises: Aerobic   Nustep level 5 x 5 minutes      Ankle Exercises: Standing   Other Standing Ankle Exercises 3# hip abduction, cues to keep toes forward      Ankle Exercises: Seated   Towel Crunch  Limitations seated multiple reps towel scrunches bilaterally, then towel pickups      Ankle Exercises: Supine   T-Band all directions both ankles red tband x 15 reps                  PT Education - 11/13/20 0958    Education Details educaredon proper footwear and inserts    Person(s) Educated Patient    Methods Explanation;Demonstration    Comprehension Verbalized understanding            PT Short Term Goals - 11/13/20 1001      PT SHORT TERM GOAL #1   Title Independent with inital HEP    Status Partially Met             PT Long Term Goals - 11/06/20 1757      PT LONG TERM GOAL #1   Title Independent with advanced HEP    Time 8    Period Weeks    Status New    Target Date 01/01/21      PT LONG TERM GOAL #2  Title Pt will demo improved ankle DF AROM to at least 10 deg bilateral without pain    Time 8    Period Weeks    Status New    Target Date 01/01/21      PT LONG TERM GOAL #3   Title Pt will report </= 2/10 foot pain after full day of work    Time 8    Period Weeks    Status New    Target Date 01/01/21      PT LONG TERM GOAL #4   Title BLE strength 4+/5    Time 8    Period Weeks    Status New    Target Date 01/01/21                 Plan - 11/13/20 0959    Clinical Impression Statement Patient did not work this weekend so she is not hurting as much, she is tight in the calves, I started good stretches in the clinic.  we added ankle and hip strength, educated on proper footwear and orthotics off the shelf and started some STM to the PF on stretch, the STM did cause some pain    PT Next Visit Plan Reassess HEP. progress LE flexibility and strength as tolerated, manual and modalities as needed    Consulted and Agree with Plan of Care Patient           Patient will benefit from skilled therapeutic intervention in order to improve the following deficits and impairments:  Abnormal gait,Decreased range of motion,Difficulty  walking,Pain,Improper body mechanics,Impaired flexibility,Hypomobility,Decreased balance,Decreased mobility,Decreased strength,Increased edema  Visit Diagnosis: Pain in left foot  Pain in right foot  Stiffness of left foot, not elsewhere classified  Stiffness of right foot, not elsewhere classified  Muscle weakness (generalized)     Problem List Patient Active Problem List   Diagnosis Date Noted  . Screening breast examination 05/11/2019  . LGSIL on Pap smear of cervix 05/11/2019  . Breast lump on right side at 11 o'clock position 05/11/2019  . Breast lump on left side at 9 o'clock position 05/11/2019  . Low grade squamous intraepith lesion on cytologic smear cervix (lgsil) 09/03/2017  . Family history of diabetes mellitus in father 02/25/2017  . Anemia 11/28/2016  . Pilonidal cyst without abscess 05/03/2016  . Vitamin D deficiency 03/16/2015  . Allergic rhinitis 03/15/2015  . Tinea pedis 03/15/2015  . Thickened endometrium 09/22/2014  . Dysmenorrhea 07/14/2014  . Tension headache 07/14/2014  . GERD (gastroesophageal reflux disease) 07/14/2014    Sumner Boast., PT 11/13/2020, 10:02 AM  [place]. [place], [place], [address] Phone: [phone]   Fax:  [phone]  Name: Braelynn Benning MRN: [id number] Date of Birth: 1975-11-19

## 2020-11-16 ENCOUNTER — Ambulatory Visit: Payer: Self-pay

## 2020-11-16 ENCOUNTER — Other Ambulatory Visit: Payer: Self-pay

## 2020-11-16 DIAGNOSIS — M79671 Pain in right foot: Secondary | ICD-10-CM

## 2020-11-16 DIAGNOSIS — M79672 Pain in left foot: Secondary | ICD-10-CM

## 2020-11-16 DIAGNOSIS — M6281 Muscle weakness (generalized): Secondary | ICD-10-CM

## 2020-11-16 DIAGNOSIS — M25675 Stiffness of left foot, not elsewhere classified: Secondary | ICD-10-CM

## 2020-11-16 DIAGNOSIS — M25674 Stiffness of right foot, not elsewhere classified: Secondary | ICD-10-CM

## 2020-11-16 MED FILL — MELOXICAM 15 MG TABLET: 15 | 30 days supply | Qty: 30 | Fill #2

## 2020-11-16 MED FILL — SUMATRIPTAN SUCC 50 MG TAB: 50 | 30 days supply | Qty: 10 | Fill #2

## 2020-11-16 NOTE — Therapy (Signed)
[place]. [place], [place], [address] Phone: [phone]   Fax:  [phone]  Physical Therapy Treatment  Patient Details  Name: Cheryl Espinoza MRN: [id number] Date of Birth: [birth year] No data recorded  Encounter Date: 11/16/2020   PT End of Session - 11/16/20 0853    Visit Number 3    Number of Visits 17    Date for PT Re-Evaluation 01/01/21    Authorization Type CAFA    PT Start Time 0845    PT Stop Time 0925    PT Time Calculation (min) 40 min    Activity Tolerance Patient tolerated treatment well;Patient limited by pain    Behavior During Therapy [facility] for tasks assessed/performed           Past Medical History:  Diagnosis Date  . GERD (gastroesophageal reflux disease)   . History of abnormal cervical Pap smear   . History of vitamin D deficiency   . Tension headache     Past Surgical History:  Procedure Laterality Date  . CESAREAN SECTION  2000, 2002     There were no vitals filed for this visit.   Subjective Assessment - 11/16/20 0852    Subjective A little sore after working on tuesday    Patient Stated Goals to decrease pain    Currently in Pain? Yes    Pain Score 4     Pain Location Foot    Pain Orientation Right;Left    Pain Descriptors / Indicators Burning                             [facility] Adult PT Treatment/Exercise - 11/16/20 0001      Manual Therapy   Manual Therapy Soft tissue mobilization    Soft tissue mobilization bilateral PF with passive stretch during      Ankle Exercises: Aerobic   Recumbent Bike L1 x 5 minutes      Ankle Exercises: Stretches   Plantar Fascia Stretch 3 reps;10 seconds    Soleus Stretch 3 reps;20 seconds    Gastroc Stretch 3 reps;20 seconds      Additional Ankle Exercises DO NOT USE   Towel Crunch Limitations --      Ankle Exercises: Supine   T-Band all directions both ankles red tband x 15 reps    Other Supine  Ankle Exercises seated multiple reps towel scrunches bilaterally, then towel pickups      Ankle Exercises: Standing   Other Standing Ankle Exercises 3# hip abduction, cues to keep toes forward 2 x10                    PT Short Term Goals - 11/13/20 1001      PT SHORT TERM GOAL #1   Title Independent with inital HEP    Status Partially Met             PT Long Term Goals - 11/06/20 1757      PT LONG TERM GOAL #1   Title Independent with advanced HEP    Time 8    Period Weeks    Status New    Target Date 01/01/21      PT LONG TERM GOAL #2   Title Pt will demo improved ankle DF AROM to at least 10 deg bilateral without pain    Time 8    Period Weeks    Status New  Target Date 01/01/21      PT LONG TERM GOAL #3   Title Pt will report </= 2/10 foot pain after full day of work    Time 8    Period Weeks    Status New    Target Date 01/01/21      PT LONG TERM GOAL #4   Title BLE strength 4+/5    Time 8    Period Weeks    Status New    Target Date 01/01/21                 Plan - 11/16/20 0859    Clinical Impression Statement Patient worked on tuesday so had a bit more soreness. Continues to have calf tightness, Continued good stretches in the clinic with ankle and hip strength. She c/o some increased pain with more intense ankle stretch on edge of step but dissipated when stopping. Educated on proper footwear and orthotics off the shelf and started some STM to the PF on stretch, the STM did cause some pain especially on medial areas of sswlling (R>L)    Examination-Activity Limitations Locomotion Level    Examination-Participation Restrictions Occupation    Rehab Potential Good    PT Frequency 2x / week    PT Treatment/Interventions ADLs/Self Care Home Management;Cryotherapy;Electrical Stimulation;Iontophoresis 4mg /ml Dexamethasone;Moist Heat;Neuromuscular re-education;Gait training;Therapeutic exercise;Functional mobility training;Patient/family  education;Taping;Vasopneumatic Device;Manual techniques    PT Next Visit Plan Reassess HEP. progress LE flexibility and strength as tolerated, manual and modalities as needed    Consulted and Agree with Plan of Care Patient           Patient will benefit from skilled therapeutic intervention in order to improve the following deficits and impairments:  Abnormal gait,Decreased range of motion,Difficulty walking,Pain,Improper body mechanics,Impaired flexibility,Hypomobility,Decreased balance,Decreased mobility,Decreased strength,Increased edema  Visit Diagnosis: Pain in left foot  Pain in right foot  Stiffness of left foot, not elsewhere classified  Stiffness of right foot, not elsewhere classified  Muscle weakness (generalized)     Problem List Patient Active Problem List   Diagnosis Date Noted  . Screening breast examination 05/11/2019  . LGSIL on Pap smear of cervix 05/11/2019  . Breast lump on right side at 11 o'clock position 05/11/2019  . Breast lump on left side at 9 o'clock position 05/11/2019  . Low grade squamous intraepith lesion on cytologic smear cervix (lgsil) 09/03/2017  . Family history of diabetes mellitus in father 02/25/2017  . Anemia 11/28/2016  . Pilonidal cyst without abscess 05/03/2016  . Vitamin D deficiency 03/16/2015  . Allergic rhinitis 03/15/2015  . Tinea pedis 03/15/2015  . Thickened endometrium 09/22/2014  . Dysmenorrhea 07/14/2014  . Tension headache 07/14/2014  . GERD (gastroesophageal reflux disease) 07/14/2014    Hall Busing, PT, DPT 11/16/2020, 9:31 AM  [place]. [place], [place], [address] Phone: [phone]   Fax:  [phone]  Name: Cheryl Espinoza MRN: [id number] Date of Birth: [birth year]

## 2020-11-20 ENCOUNTER — Other Ambulatory Visit: Payer: Self-pay

## 2020-11-20 ENCOUNTER — Encounter: Payer: Self-pay | Admitting: Physical Therapy

## 2020-11-20 ENCOUNTER — Ambulatory Visit: Payer: Self-pay | Admitting: Physical Therapy

## 2020-11-20 DIAGNOSIS — M25674 Stiffness of right foot, not elsewhere classified: Secondary | ICD-10-CM

## 2020-11-20 DIAGNOSIS — M79671 Pain in right foot: Secondary | ICD-10-CM

## 2020-11-20 DIAGNOSIS — M79672 Pain in left foot: Secondary | ICD-10-CM

## 2020-11-20 DIAGNOSIS — M6281 Muscle weakness (generalized): Secondary | ICD-10-CM

## 2020-11-20 DIAGNOSIS — M25675 Stiffness of left foot, not elsewhere classified: Secondary | ICD-10-CM

## 2020-11-20 NOTE — Therapy (Signed)
[facility] [address] [facility]. [place], [place], [address] Phone: [phone]   Fax:  [phone]  Physical Therapy Treatment  Patient Details  Name: Cheryl Espinoza MRN: [id number] Date of Birth: [birth year] No data recorded  Encounter Date: 11/20/2020   PT End of Session - 11/20/20 1003    Visit Number 4    Number of Visits 17    Date for PT Re-Evaluation 01/01/21    Authorization Type CAFA    PT Start Time 0920    PT Stop Time 1009    PT Time Calculation (min) 49 min    Activity Tolerance Patient tolerated treatment well    Behavior During Therapy [facility] for tasks assessed/performed           Past Medical History:  Diagnosis Date  . GERD (gastroesophageal reflux disease)   . History of abnormal cervical Pap smear   . History of vitamin D deficiency   . Tension headache     Past Surgical History:  Procedure Laterality Date  . CESAREAN SECTION  2000, 2002     There were no vitals filed for this visit.   Subjective Assessment - 11/20/20 0924    Subjective I worked this weekend, hurting some, the last treatment did seem to help    Currently in Pain? Yes    Pain Score 5     Pain Location Foot    Pain Orientation Right;Left    Aggravating Factors  standing and walking at work                             [facility] Adult PT Treatment/Exercise - 11/20/20 0001      Modalities   Modalities Electrical Stimulation      Electrical Stimulation   Electrical Stimulation Location right plantar foot    Electrical Stimulation Action premod    Electrical Stimulation Parameters supine    Electrical Stimulation Goals Pain      Manual Therapy   Manual Therapy Soft tissue mobilization    Soft tissue mobilization bilateral PF with passive stretch during      Ankle Exercises: Aerobic   Recumbent Bike L1 x 5 minutes    Nustep level 5 x 5 minutes      Ankle Exercises: Stretches   Plantar Fascia Stretch  3 reps;10 seconds    Soleus Stretch 3 reps;20 seconds    Gastroc Stretch 3 reps;20 seconds      Ankle Exercises: Standing   Other Standing Ankle Exercises 3# hip abduction, cues to keep toes forward 2 x10      Ankle Exercises: Seated   Towel Crunch 5 reps    Marble Pickup 10 reps each foot                    PT Short Term Goals - 11/20/20 1005      PT SHORT TERM GOAL #1   Title Independent with inital HEP    Status Achieved             PT Long Term Goals - 11/20/20 1005      PT LONG TERM GOAL #1   Title Independent with advanced HEP    Status On-going      PT LONG TERM GOAL #2   Title Pt will demo improved ankle DF AROM to at least 10 deg bilateral without pain    Status On-going  PT LONG TERM GOAL #3   Title Pt will report </= 2/10 foot pain after full day of work    Status On-going                 Plan - 11/20/20 1004    Clinical Impression Statement Patient able to tolerate all activities without increase of pain, she reports that she tried an insert but it caused more pai.  She does have some diffiuclty with the towel scrunch and the marble pickup.  Calves are tight, she is tender in the PF area    PT Next Visit Plan continue to add as tolerated           Patient will benefit from skilled therapeutic intervention in order to improve the following deficits and impairments:  Abnormal gait,Decreased range of motion,Difficulty walking,Pain,Improper body mechanics,Impaired flexibility,Hypomobility,Decreased balance,Decreased mobility,Decreased strength,Increased edema  Visit Diagnosis: Pain in left foot  Pain in right foot  Stiffness of left foot, not elsewhere classified  Stiffness of right foot, not elsewhere classified  Muscle weakness (generalized)     Problem List Patient Active Problem List   Diagnosis Date Noted  . Screening breast examination 05/11/2019  . LGSIL on Pap smear of cervix 05/11/2019  . Breast lump on right  side at 11 o'clock position 05/11/2019  . Breast lump on left side at 9 o'clock position 05/11/2019  . Low grade squamous intraepith lesion on cytologic smear cervix (lgsil) 09/03/2017  . Family history of diabetes mellitus in father 02/25/2017  . Anemia 11/28/2016  . Pilonidal cyst without abscess 05/03/2016  . Vitamin D deficiency 03/16/2015  . Allergic rhinitis 03/15/2015  . Tinea pedis 03/15/2015  . Thickened endometrium 09/22/2014  . Dysmenorrhea 07/14/2014  . Tension headache 07/14/2014  . GERD (gastroesophageal reflux disease) 07/14/2014    Jearld Lesch., PT 11/20/2020, 10:06 AM  [facility] [address]. [place], [place], [address] Phone: [phone]   Fax:  [phone]  Name: Cheryl Espinoza MRN: [id number] Date of Birth: 06/04/1976

## 2020-11-23 ENCOUNTER — Other Ambulatory Visit: Payer: Self-pay

## 2020-11-23 ENCOUNTER — Ambulatory Visit: Payer: Self-pay

## 2020-11-23 DIAGNOSIS — M25674 Stiffness of right foot, not elsewhere classified: Secondary | ICD-10-CM

## 2020-11-23 DIAGNOSIS — M6281 Muscle weakness (generalized): Secondary | ICD-10-CM

## 2020-11-23 DIAGNOSIS — M79672 Pain in left foot: Secondary | ICD-10-CM

## 2020-11-23 DIAGNOSIS — M79671 Pain in right foot: Secondary | ICD-10-CM

## 2020-11-23 DIAGNOSIS — M25675 Stiffness of left foot, not elsewhere classified: Secondary | ICD-10-CM

## 2020-11-23 NOTE — Therapy (Signed)
[facility] [address]. [place], [place], [address] Phone: [phone]   Fax:  [phone]  Physical Therapy Treatment  Patient Details  Name: Cheryl Espinoza MRN: [id number] Date of Birth: [birth year] No data recorded  Encounter Date: 11/23/2020   PT End of Session - 11/23/20 1019    Visit Number 5    Number of Visits 17    Date for PT Re-Evaluation 01/01/21    Authorization Type CAFA    PT Start Time 1015    PT Stop Time 1057    PT Time Calculation (min) 42 min    Activity Tolerance Patient tolerated treatment well    Behavior During Therapy [facility] for tasks assessed/performed           Past Medical History:  Diagnosis Date  . GERD (gastroesophageal reflux disease)   . History of abnormal cervical Pap smear   . History of vitamin D deficiency   . Tension headache     Past Surgical History:  Procedure Laterality Date  . CESAREAN SECTION  2000, 2002     There were no vitals filed for this visit.   Subjective Assessment - 11/23/20 1018    Subjective Doing okay, a little better. Bought a pair of brooks sneakers for work and has noticed improvement in pain/comfort when walking    Pertinent History unremarkable    How long can you walk comfortably? <20 minutes before increasing pain    Patient Stated Goals to decrease pain    Currently in Pain? Yes    Pain Score 4     Pain Location Foot    Pain Orientation Right;Left               [facility] Adult PT Treatment/Exercise - 11/23/20 0001                            Manual Therapy   Manual Therapy Soft tissue mobilization    Soft tissue mobilization bilateral PF with passive stretch during, distal achilles, gentle ankle mobs     Ankle Exercises: Aerobic   Recumbent Bike L1 x 4 minutes    Nustep level 5 x 5 minutes      Ankle Exercises: Stretches   Plantar Fascia Stretch 3 reps;10 seconds    Soleus Stretch 3 reps;20 seconds    Gastroc Stretch  3 reps;20 seconds   standing at bar     Ankle Exercises: Standing   Other Standing Ankle Exercises kickstand balance  with sock spacer betwen toes of stance leg 20" x 2 B      Ankle Exercises: Seated   Towel Crunch --   10 reps each foot     Ankle Exercises: Supine   T-Band all directions both ankles red tband x 15 reps                    PT Short Term Goals - 11/20/20 1005      PT SHORT TERM GOAL #1   Title Independent with inital HEP    Status Achieved             PT Long Term Goals - 11/20/20 1005      PT LONG TERM GOAL #1   Title Independent with advanced HEP    Status On-going      PT LONG TERM GOAL #2   Title Pt will demo improved ankle DF AROM to at  least 10 deg bilateral without pain    Status On-going      PT LONG TERM GOAL #3   Title Pt will report </= 2/10 foot pain after full day of work    Status On-going                 Plan - 11/23/20 1033    Clinical Impression Statement Patient able to tolerate all activities without increase of pain. She does have some diffiuclty with the towel scrunch. Calves are tight, she is tender in the PF area especially in area of injections with contiued swelling and at the heel where she says she still gets burning pain. Educated on [employer] home program and importance of supportive footwear.    Examination-Activity Limitations Locomotion Level    Examination-Participation Restrictions Occupation    Rehab Potential Good    PT Frequency 2x / week    PT Duration --   6-8 wks   PT Treatment/Interventions ADLs/Self Care Home Management;Cryotherapy;Electrical Stimulation;Iontophoresis 4mg /ml Dexamethasone;Moist Heat;Neuromuscular re-education;Gait training;Therapeutic exercise;Functional mobility training;Patient/family education;Taping;Vasopneumatic Device;Manual techniques    PT Next Visit Plan continue to add as tolerated    Consulted and Agree with Plan of Care Patient           Patient will benefit  from skilled therapeutic intervention in order to improve the following deficits and impairments:  Abnormal gait,Decreased range of motion,Difficulty walking,Pain,Improper body mechanics,Impaired flexibility,Hypomobility,Decreased balance,Decreased mobility,Decreased strength,Increased edema  Visit Diagnosis: Pain in left foot  Pain in right foot  Stiffness of left foot, not elsewhere classified  Stiffness of right foot, not elsewhere classified  Muscle weakness (generalized)     Problem List Patient Active Problem List   Diagnosis Date Noted  . Screening breast examination 05/11/2019  . LGSIL on Pap smear of cervix 05/11/2019  . Breast lump on right side at 11 o'clock position 05/11/2019  . Breast lump on left side at 9 o'clock position 05/11/2019  . Low grade squamous intraepith lesion on cytologic smear cervix (lgsil) 09/03/2017  . Family history of diabetes mellitus in father 02/25/2017  . Anemia 11/28/2016  . Pilonidal cyst without abscess 05/03/2016  . Vitamin D deficiency 03/16/2015  . Allergic rhinitis 03/15/2015  . Tinea pedis 03/15/2015  . Thickened endometrium 09/22/2014  . Dysmenorrhea 07/14/2014  . Tension headache 07/14/2014  . GERD (gastroesophageal reflux disease) 07/14/2014    07/16/2014, PT, DPT 11/23/2020, 10:59 AM  [facility] [address]. [place], [place], [place] Phone: [phone]   Fax:  [phone]  Name: Cheryl Espinoza MRN: [name] Date of Birth: 1976-08-26

## 2020-11-25 ENCOUNTER — Other Ambulatory Visit: Payer: Self-pay

## 2020-11-27 ENCOUNTER — Other Ambulatory Visit: Payer: Self-pay

## 2020-11-27 ENCOUNTER — Ambulatory Visit: Payer: No Typology Code available for payment source

## 2020-11-27 ENCOUNTER — Ambulatory Visit: Payer: Self-pay | Attending: Podiatry

## 2020-11-27 DIAGNOSIS — M79671 Pain in right foot: Secondary | ICD-10-CM

## 2020-11-27 DIAGNOSIS — M25674 Stiffness of right foot, not elsewhere classified: Secondary | ICD-10-CM

## 2020-11-27 DIAGNOSIS — M6281 Muscle weakness (generalized): Secondary | ICD-10-CM

## 2020-11-27 DIAGNOSIS — M79672 Pain in left foot: Secondary | ICD-10-CM

## 2020-11-27 DIAGNOSIS — M25675 Stiffness of left foot, not elsewhere classified: Secondary | ICD-10-CM

## 2020-11-27 NOTE — Therapy (Signed)
[facility] [place] [address] [place], [place], [address] Phone: [phone]   Fax:  [phone]  Physical Therapy Treatment  Patient Details  Name: Cheryl Espinoza MRN: [id number] Date of Birth: Oct 28, 1975 No data recorded  Encounter Date: 11/27/2020   PT End of Session - 11/27/20 1403    Visit Number 6    Number of Visits 17    Date for PT Re-Evaluation 01/01/21    Authorization Type CAFA    PT Start Time 1358    PT Stop Time 1440    PT Time Calculation (min) 42 min    Activity Tolerance Patient tolerated treatment well    Behavior During Therapy [facility] for tasks assessed/performed           Past Medical History:  Diagnosis Date  . GERD (gastroesophageal reflux disease)   . History of abnormal cervical Pap smear   . History of vitamin D deficiency   . Tension headache     Past Surgical History:  Procedure Laterality Date  . CESAREAN SECTION  2000, 2002     There were no vitals filed for this visit.   Subjective Assessment - 11/27/20 1402    Subjective A little better today. felt like pain was a little better the last shift she worked    Pertinent History unremarkable    How long can you walk comfortably? <20 minutes before increasing pain    Patient Stated Goals to decrease pain    Currently in Pain? Yes    Pain Score 4     Pain Location Foot    Pain Orientation Right;Left    Pain Descriptors / Indicators Burning                             [facility] Adult PT Treatment/Exercise - 11/27/20 0001      Manual Therapy   Manual Therapy Soft tissue mobilization    Soft tissue mobilization bilateral PF with passive stretch during      Ankle Exercises: Aerobic   Recumbent Bike L2 x 5 minutes    Nustep level 5 x 5 minutes      Ankle Exercises: Stretches   Plantar Fascia Stretch 3 reps;10 seconds    Soleus Stretch 3 reps;20 seconds    Gastroc Stretch --      Ankle Exercises: Seated    Towel Crunch --   10 reps each foot   Other Seated Ankle Exercises Seated HS curl x10 B green      Ankle Exercises: Supine   T-Band all directions both ankles green tband x 15 reps      Ankle Exercises: Standing   Other Standing Ankle Exercises --    Other Standing Ankle Exercises edge of step eccentric DF to end available range to concentric PF x 10. Stadning active controlled DF/PF on wbble board A-P x10      Ankle Exercises: Machines for Strengthening   Cybex Leg Press 20# 10 x 2 low squats                    PT Short Term Goals - 11/20/20 1005      PT SHORT TERM GOAL #1   Title Independent with inital HEP    Status Achieved             PT Long Term Goals - 11/20/20 1005      PT LONG TERM GOAL #  1   Title Independent with advanced HEP    Status On-going      PT LONG TERM GOAL #2   Title Pt will demo improved ankle DF AROM to at least 10 deg bilateral without pain    Status On-going      PT LONG TERM GOAL #3   Title Pt will report </= 2/10 foot pain after full day of work    Status On-going                 Plan - 11/27/20 1404    Clinical Impression Statement Patient able to tolerate all activities without increase of pain. Holding ankles stiff in plantarflexion with bike requiring cues to move though some DF andPF while pedaling. Also frequently goes into knee hyerextension requiring cues to decrease.  Swelling on inner part of right foot has gone donw slighlty, left selling has improved more.    Examination-Activity Limitations Locomotion Level    Examination-Participation Restrictions Occupation    Rehab Potential Good    PT Frequency 2x / week    PT Treatment/Interventions ADLs/Self Care Home Management;Cryotherapy;Electrical Stimulation;Iontophoresis 4mg /ml Dexamethasone;Moist Heat;Neuromuscular re-education;Gait training;Therapeutic exercise;Functional mobility training;Patient/family education;Taping;Vasopneumatic Device;Manual techniques     PT Next Visit Plan continue to add as tolerated    Consulted and Agree with Plan of Care Patient           Patient will benefit from skilled therapeutic intervention in order to improve the following deficits and impairments:  Abnormal gait,Decreased range of motion,Difficulty walking,Pain,Improper body mechanics,Impaired flexibility,Hypomobility,Decreased balance,Decreased mobility,Decreased strength,Increased edema  Visit Diagnosis: Pain in left foot  Pain in right foot  Stiffness of left foot, not elsewhere classified  Muscle weakness (generalized)  Stiffness of right foot, not elsewhere classified     Problem List Patient Active Problem List   Diagnosis Date Noted  . Screening breast examination 05/11/2019  . LGSIL on Pap smear of cervix 05/11/2019  . Breast lump on right side at 11 o'clock position 05/11/2019  . Breast lump on left side at 9 o'clock position 05/11/2019  . Low grade squamous intraepith lesion on cytologic smear cervix (lgsil) 09/03/2017  . Family history of diabetes mellitus in father 02/25/2017  . Anemia 11/28/2016  . Pilonidal cyst without abscess 05/03/2016  . Vitamin D deficiency 03/16/2015  . Allergic rhinitis 03/15/2015  . Tinea pedis 03/15/2015  . Thickened endometrium 09/22/2014  . Dysmenorrhea 07/14/2014  . Tension headache 07/14/2014  . GERD (gastroesophageal reflux disease) 07/14/2014    07/16/2014, PT, DPT 11/27/2020, 2:48 PM  [facility] [place] [facility] [address] [place], [place], [place] Phone: [phone]   Fax:  [phone]  Name: Cheryl Espinoza MRN: [name] Date of Birth: 10-Nov-1975

## 2020-11-30 ENCOUNTER — Other Ambulatory Visit: Payer: Self-pay

## 2020-11-30 ENCOUNTER — Ambulatory Visit: Payer: Self-pay

## 2020-11-30 DIAGNOSIS — M79671 Pain in right foot: Secondary | ICD-10-CM

## 2020-11-30 DIAGNOSIS — M79672 Pain in left foot: Secondary | ICD-10-CM

## 2020-11-30 DIAGNOSIS — M25675 Stiffness of left foot, not elsewhere classified: Secondary | ICD-10-CM

## 2020-11-30 DIAGNOSIS — M25674 Stiffness of right foot, not elsewhere classified: Secondary | ICD-10-CM

## 2020-11-30 DIAGNOSIS — M6281 Muscle weakness (generalized): Secondary | ICD-10-CM

## 2020-11-30 NOTE — Therapy (Signed)
[facility] [place] [address] [place], [place], [address] Phone: [phone]   Fax:  [phone]  Physical Therapy Treatment  Patient Details  Name: Cheryl Espinoza MRN: [id number] Date of Birth: 1975/10/15 No data recorded  Encounter Date: 11/30/2020   PT End of Session - 11/30/20 0934    Visit Number 7    Number of Visits 17    Date for PT Re-Evaluation 01/01/21    Authorization Type CAFA    PT Start Time 0930    PT Stop Time 1017    PT Time Calculation (min) 47 min    Activity Tolerance Patient tolerated treatment well    Behavior During Therapy [facility] for tasks assessed/performed           Past Medical History:  Diagnosis Date  . GERD (gastroesophageal reflux disease)   . History of abnormal cervical Pap smear   . History of vitamin D deficiency   . Tension headache     Past Surgical History:  Procedure Laterality Date  . CESAREAN SECTION  2000, 2002     There were no vitals filed for this visit.   Subjective Assessment - 11/30/20 0933    Subjective A little better today    Pertinent History unremarkable    Patient Stated Goals to decrease pain    Currently in Pain? Yes    Pain Score 3     Pain Location Foot    Pain Orientation Right;Left    Pain Descriptors / Indicators Burning                [facility] Adult PT Treatment/Exercise - 11/30/20 0001      Modalities   Modalities Cryotherapy      Cryotherapy   Number Minutes Cryotherapy 6 Minutes    Cryotherapy Location --   both feet   Type of Cryotherapy Ice pack      Manual Therapy   Manual Therapy Soft tissue mobilization    Soft tissue mobilization bilateral PF with passive stretch during, gentle MT mobs, calcaneal mobs     Ankle Exercises: Aerobic   Recumbent Bike L2.5 x 6 minutes    Nustep level 6 x 4 minutes      Ankle Exercises: Stretches   Plantar Fascia Stretch 3 reps;10 seconds    Soleus Stretch --    Other Stretch deep  squat x5 with 5 second hold with heels on ground      Ankle Exercises: Seated   Towel Crunch --   10 reps each foot   Other Seated Ankle Exercises Seated HS curl x10 B green      Ankle Exercises: Supine   T-Band all directions both ankles green tband x 15 reps      Ankle Exercises: Standing   Other Standing Ankle Exercises edge of step eccentric DF to end available range to concentric PF 2 x 10. Stadning active controlled DF/PF on wbble board A-P (max cues to prevent knee hyperext) x10      Ankle Exercises: Machines for Strengthening   Cybex Leg Press 20# 10 x, 30# x 10 low squats   max cues for controlled motion and preventing hyperext at knees B                   PT Short Term Goals - 11/20/20 1005      PT SHORT TERM GOAL #1   Title Independent with inital HEP    Status Achieved  PT Long Term Goals - 11/20/20 1005      PT LONG TERM GOAL #1   Title Independent with advanced HEP    Status On-going      PT LONG TERM GOAL #2   Title Pt will demo improved ankle DF AROM to at least 10 deg bilateral without pain    Status On-going      PT LONG TERM GOAL #3   Title Pt will report </= 2/10 foot pain after full day of work    Status On-going                 Plan - 11/30/20 0934    Clinical Impression Statement Patient able to tolerate all activities without increase of pain. Making some progress with ankle ROM and pain. She Continues to compensate for gastroc tightness by frequently going into knee hyerextension requiring cues to decrease. Swelling on inner part of right foot has gone down slighlty, left swelling has improved more. Ended session with some ice to B plantar surfaces with good tolerance.    Examination-Activity Limitations Locomotion Level    Examination-Participation Restrictions Occupation    Rehab Potential Good    PT Frequency 2x / week    PT Treatment/Interventions ADLs/Self Care Home Management;Cryotherapy;Electrical  Stimulation;Iontophoresis 4mg /ml Dexamethasone;Moist Heat;Neuromuscular re-education;Gait training;Therapeutic exercise;Functional mobility training;Patient/family education;Taping;Vasopneumatic Device;Manual techniques    PT Next Visit Plan continue to add as tolerated    Consulted and Agree with Plan of Care Patient           Patient will benefit from skilled therapeutic intervention in order to improve the following deficits and impairments:  Abnormal gait,Decreased range of motion,Difficulty walking,Pain,Improper body mechanics,Impaired flexibility,Hypomobility,Decreased balance,Decreased mobility,Decreased strength,Increased edema  Visit Diagnosis: Pain in left foot  Stiffness of left foot, not elsewhere classified  Pain in right foot  Muscle weakness (generalized)  Stiffness of right foot, not elsewhere classified     Problem List Patient Active Problem List   Diagnosis Date Noted  . Screening breast examination 05/11/2019  . LGSIL on Pap smear of cervix 05/11/2019  . Breast lump on right side at 11 o'clock position 05/11/2019  . Breast lump on left side at 9 o'clock position 05/11/2019  . Low grade squamous intraepith lesion on cytologic smear cervix (lgsil) 09/03/2017  . Family history of diabetes mellitus in father 02/25/2017  . Anemia 11/28/2016  . Pilonidal cyst without abscess 05/03/2016  . Vitamin D deficiency 03/16/2015  . Allergic rhinitis 03/15/2015  . Tinea pedis 03/15/2015  . Thickened endometrium 09/22/2014  . Dysmenorrhea 07/14/2014  . Tension headache 07/14/2014  . GERD (gastroesophageal reflux disease) 07/14/2014    07/16/2014 , PT, DPT 11/30/2020, 10:29 AM  [facility] [place] [facility] [address] [place], [place], [place] Phone: [phone]   Fax:  [phone]  Name: Cheryl Espinoza MRN: [name] Date of Birth: 08-Mar-1976

## 2020-12-07 ENCOUNTER — Encounter: Payer: Self-pay | Admitting: Podiatry

## 2020-12-07 ENCOUNTER — Ambulatory Visit (INDEPENDENT_AMBULATORY_CARE_PROVIDER_SITE_OTHER): Payer: No Typology Code available for payment source | Admitting: Podiatry

## 2020-12-07 ENCOUNTER — Other Ambulatory Visit: Payer: Self-pay

## 2020-12-07 DIAGNOSIS — M722 Plantar fascial fibromatosis: Secondary | ICD-10-CM

## 2020-12-07 MED ORDER — DEXAMETHASONE SODIUM PHOSPHATE 4 MG/ML IJ SOLN
4.0000 mg | Freq: Once | INTRAMUSCULAR | Status: AC
  Administered 2020-12-07: 4 mg

## 2020-12-07 MED ORDER — TRIAMCINOLONE ACETONIDE 10 MG/ML IJ SUSP
10.0000 mg | Freq: Once | INTRAMUSCULAR | Status: AC
  Administered 2020-12-07: 10 mg

## 2020-12-07 NOTE — Progress Notes (Signed)
  Subjective:  Patient ID: Cheryl Espinoza, female    DOB: [birth year],  MRN: [id number]  Chief Complaint  Patient presents with  . Follow-up    6 week f/u rfc-pt reports pain in feet has improved but still present-has noticed burning sensations in heels of feet that remains constant-gets some relief w/ ice but not 100%    45 y.o. female returns for follow-up with the above complaint. History confirmed with patient. Translation provided in person from [place] to [place].  Overall doing better therapy is helpful but not 100%, right side is worse than the left  Objective:  Physical Exam: warm, good capillary refill, no trophic changes or ulcerative lesions, normal DP and PT pulses and normal sensory exam. Left Foot: point tenderness over the heel pad  Right Foot: point tenderness over the heel pad   No images are attached to the encounter.  Radiographs: X-ray of both feet: no fracture, dislocation, swelling or degenerative changes noted and plantar calcaneal spur Assessment:   1. Plantar fasciitis of right foot   2. Plantar fasciitis of left foot      Plan:  Patient was evaluated and treated and all questions answered.  -Repeat injection was performed today -Continue physical therapy -Continue meloxicam -Continue stretching and icing at home as well  After sterile prep with povidone-iodine solution and alcohol, the bilateral heel was injected with 0.5cc 2% xylocaine plain, 0.5cc 0.5% marcaine plain, 5mg  triamcinolone acetonide, and 2mg  dexamethasone was injected along  the plantar fascia at the insertion on the plantar calcaneus. The patient tolerated the procedure well without complication.

## 2020-12-14 ENCOUNTER — Ambulatory Visit: Payer: Self-pay | Admitting: Physical Therapy

## 2020-12-18 ENCOUNTER — Ambulatory Visit: Payer: Self-pay | Admitting: Physical Therapy

## 2020-12-21 ENCOUNTER — Ambulatory Visit: Payer: Self-pay

## 2020-12-21 ENCOUNTER — Other Ambulatory Visit: Payer: Self-pay

## 2020-12-21 DIAGNOSIS — M25675 Stiffness of left foot, not elsewhere classified: Secondary | ICD-10-CM

## 2020-12-21 DIAGNOSIS — M79672 Pain in left foot: Secondary | ICD-10-CM

## 2020-12-21 DIAGNOSIS — M79671 Pain in right foot: Secondary | ICD-10-CM

## 2020-12-21 DIAGNOSIS — M6281 Muscle weakness (generalized): Secondary | ICD-10-CM

## 2020-12-21 DIAGNOSIS — M25674 Stiffness of right foot, not elsewhere classified: Secondary | ICD-10-CM

## 2020-12-21 NOTE — Therapy (Signed)
[facility] [place] [facility] [address] [place], [place], [address] Phone: [phone]   Fax:  [phone]  Physical Therapy Treatment  Patient Details  Name: Cheryl Espinoza MRN: [id number] Date of Birth: 1976-08-22 No data recorded  Encounter Date: 12/21/2020   PT End of Session - 12/21/20 1106    Visit Number 8    Date for PT Re-Evaluation 01/01/21    Authorization Type CAFA    PT Start Time 1100    PT Stop Time 1148    PT Time Calculation (min) 48 min    Activity Tolerance Patient tolerated treatment well    Behavior During Therapy [facility] for tasks assessed/performed           Past Medical History:  Diagnosis Date  . GERD (gastroesophageal reflux disease)   . History of abnormal cervical Pap smear   . History of vitamin D deficiency   . Tension headache     Past Surgical History:  Procedure Laterality Date  . CESAREAN SECTION  2000, 2002     There were no vitals filed for this visit.   Subjective Assessment - 12/21/20 1105    Subjective Saw Dr Lilian Kapur on 4/14 and got another set of injection. Pain has been alot better    Pertinent History unremarkable    Patient Stated Goals to decrease pain    Currently in Pain? Yes    Pain Score 3     Pain Location Foot    Pain Orientation Right    Pain Descriptors / Indicators Burning               [facility] Adult PT Treatment/Exercise - 12/21/20 0001      Exercises   Exercises Knee/Hip      Knee/Hip Exercises: Machines for Strengthening   Cybex Knee Flexion 25# 15 x 2      Modalities   Modalities Cryotherapy;Electrical Stimulation      Cryotherapy   Number Minutes Cryotherapy 10 Minutes    Type of Cryotherapy Ice pack   B plantar foot     Electrical Stimulation   Electrical Stimulation Location bilateral plantar foot    Electrical Stimulation Action premod    Electrical Stimulation Parameters supine    Electrical Stimulation Goals Pain      Ankle  Exercises: Stretches   Gastroc Stretch 4 reps;30 seconds   standing at bar.     Ankle Exercises: Machines for Strengthening   Cybex Leg Press 40# x 10 low squats, 20# x 10 lower at level 3.      Ankle Exercises: Seated   Other Seated Ankle Exercises Seated HS curl x15 B green      Ankle Exercises: Standing   Other Standing Ankle Exercises heel rise x 10 at counter. x 10 incline against wall x 15      Ankle Exercises: Aerobic   Recumbent Bike L3 x    Nustep L6 x 5 min                    PT Short Term Goals - 11/20/20 1005      PT SHORT TERM GOAL #1   Title Independent with inital HEP    Status Achieved             PT Long Term Goals - 11/20/20 1005      PT LONG TERM GOAL #1   Title Independent with advanced HEP    Status On-going  PT LONG TERM GOAL #2   Title Pt will demo improved ankle DF AROM to at least 10 deg bilateral without pain    Status On-going      PT LONG TERM GOAL #3   Title Pt will report </= 2/10 foot pain after full day of work    Status On-going                 Plan - 12/21/20 1106    Clinical Impression Statement Patient able to tolerate all activities without increase of pain. Continues to make some progress with ankle ROM and pain. She does still to compensate for gastroc tightness by frequently going into knee hyerextension requiring cues to decrease, but it seemed to be  less frequent snapping bakc into hyperext today with exercises.End of session with estim and cryo to plantar surface of feet with good tolerance    Examination-Activity Limitations Locomotion Level    Examination-Participation Restrictions Occupation    Rehab Potential Good    PT Frequency 2x / week    PT Treatment/Interventions ADLs/Self Care Home Management;Cryotherapy;Electrical Stimulation;Iontophoresis 4mg /ml Dexamethasone;Moist Heat;Neuromuscular re-education;Gait training;Therapeutic exercise;Functional mobility training;Patient/family  education;Taping;Vasopneumatic Device;Manual techniques    PT Next Visit Plan continue to add as tolerated    Consulted and Agree with Plan of Care Patient           Patient will benefit from skilled therapeutic intervention in order to improve the following deficits and impairments:  Abnormal gait,Decreased range of motion,Difficulty walking,Pain,Improper body mechanics,Impaired flexibility,Hypomobility,Decreased balance,Decreased mobility,Decreased strength,Increased edema  Visit Diagnosis: Pain in left foot  Stiffness of left foot, not elsewhere classified  Pain in right foot  Muscle weakness (generalized)  Stiffness of right foot, not elsewhere classified     Problem List Patient Active Problem List   Diagnosis Date Noted  . Screening breast examination 05/11/2019  . LGSIL on Pap smear of cervix 05/11/2019  . Breast lump on right side at 11 o'clock position 05/11/2019  . Breast lump on left side at 9 o'clock position 05/11/2019  . Low grade squamous intraepith lesion on cytologic smear cervix (lgsil) 09/03/2017  . Family history of diabetes mellitus in father 02/25/2017  . Anemia 11/28/2016  . Pilonidal cyst without abscess 05/03/2016  . Vitamin D deficiency 03/16/2015  . Allergic rhinitis 03/15/2015  . Tinea pedis 03/15/2015  . Thickened endometrium 09/22/2014  . Dysmenorrhea 07/14/2014  . Tension headache 07/14/2014  . GERD (gastroesophageal reflux disease) 07/14/2014    07/16/2014, PT, DPT 12/21/2020, 11:43 AM  [facility] [address] [place], [place], [place] Phone: [phone]   Fax:  [phone]  Name: Cheryl Espinoza MRN: [name] Date of Birth: 09/09/1975

## 2020-12-25 ENCOUNTER — Other Ambulatory Visit: Payer: Self-pay

## 2020-12-25 ENCOUNTER — Ambulatory Visit: Payer: Self-pay | Attending: Podiatry

## 2020-12-25 DIAGNOSIS — M79672 Pain in left foot: Secondary | ICD-10-CM | POA: Insufficient documentation

## 2020-12-25 DIAGNOSIS — M25674 Stiffness of right foot, not elsewhere classified: Secondary | ICD-10-CM | POA: Insufficient documentation

## 2020-12-25 DIAGNOSIS — M25675 Stiffness of left foot, not elsewhere classified: Secondary | ICD-10-CM | POA: Insufficient documentation

## 2020-12-25 DIAGNOSIS — M6281 Muscle weakness (generalized): Secondary | ICD-10-CM | POA: Insufficient documentation

## 2020-12-25 DIAGNOSIS — M79671 Pain in right foot: Secondary | ICD-10-CM | POA: Insufficient documentation

## 2020-12-25 NOTE — Therapy (Signed)
[facility] [place] [facility] [address] [place], [place], [address] Phone: [phone]   Fax:  [phone]  Physical Therapy Treatment  Patient Details  Name: Cheryl Espinoza MRN: [id number] Date of Birth: [birth year] No data recorded  Encounter Date: 12/25/2020   PT End of Session - 12/25/20 0941    Visit Number 9    Date for PT Re-Evaluation 01/01/21    Authorization Type CAFA    PT Start Time 0930    PT Stop Time 1015    PT Time Calculation (min) 45 min    Activity Tolerance Patient tolerated treatment well    Behavior During Therapy [facility] for tasks assessed/performed           Past Medical History:  Diagnosis Date  . GERD (gastroesophageal reflux disease)   . History of abnormal cervical Pap smear   . History of vitamin D deficiency   . Tension headache     Past Surgical History:  Procedure Laterality Date  . CESAREAN SECTION  2000, 2002     There were no vitals filed for this visit.   Subjective Assessment - 12/25/20 0940    Subjective went back to work and had some of the burning foot pain but less than before    Pertinent History unremarkable    Patient Stated Goals to decrease pain    Currently in Pain? Yes    Pain Score 2     Pain Location Foot    Pain Orientation Right;Left            [facility] Adult PT Treatment/Exercise - 12/25/20 0001      Knee/Hip Exercises: Machines for Strengthening   Cybex Knee Flexion 25# 15 x 2      Knee/Hip Exercises: Standing   Other Standing Knee Exercises tandem balance 2x 20" B. SL on airex with cone taps x3 - 2 sets of 5. alot of ankle rocking ML istability      Ankle Exercises: Aerobic   Recumbent Bike L4 x 5 min    Nustep L6 x 6 min      Ankle Exercises: Stretches   Gastroc Stretch 2 reps;60 seconds   at bar. followed by eccentric heel raises 10 x 2     Ankle Exercises: Standing   Rocker Board --   wobble board x10 CW/CCW   Balance Beam airex x 5 laps each  tandem forward and backward, side stepping.    Other Standing Ankle Exercises --      Ankle Exercises: Machines for Strengthening   Cybex Leg Press 40# 15 x 2 - low at level 3.                    PT Short Term Goals - 11/20/20 1005      PT SHORT TERM GOAL #1   Title Independent with inital HEP    Status Achieved             PT Long Term Goals - 11/20/20 1005      PT LONG TERM GOAL #1   Title Independent with advanced HEP    Status On-going      PT LONG TERM GOAL #2   Title Pt will demo improved ankle DF AROM to at least 10 deg bilateral without pain    Status On-going      PT LONG TERM GOAL #3   Title Pt will report </= 2/10 foot pain after full day of work  Status On-going                 Plan - 12/25/20 1610    Clinical Impression Statement Patient able to tolerate all activities without increase of pain. Continues to make some progress with ankle ROM and pain.Less frequent snapping back into hyperext to compensate for ankle tightness wiith exercises. Functional ROM seems to be getting easier and smoother. Incorporated more standing balance challenges, alot of ankle instability but ble to use balance reactiosn to prevent LOB. Most difficulty with narrow BOS    Examination-Activity Limitations Locomotion Level    Examination-Participation Restrictions Occupation    Rehab Potential Good    PT Frequency 2x / week    PT Treatment/Interventions ADLs/Self Care Home Management;Cryotherapy;Electrical Stimulation;Iontophoresis 4mg /ml Dexamethasone;Moist Heat;Neuromuscular re-education;Gait training;Therapeutic exercise;Functional mobility training;Patient/family education;Taping;Vasopneumatic Device;Manual techniques    PT Next Visit Plan continue to add as tolerated    PT Home Exercise Plan plan to update HEP next visit.    Consulted and Agree with Plan of Care Patient           Patient will benefit from skilled therapeutic intervention in order to  improve the following deficits and impairments:  Abnormal gait,Decreased range of motion,Difficulty walking,Pain,Improper body mechanics,Impaired flexibility,Hypomobility,Decreased balance,Decreased mobility,Decreased strength,Increased edema  Visit Diagnosis: Stiffness of left foot, not elsewhere classified  Pain in left foot  Pain in right foot  Stiffness of right foot, not elsewhere classified  Muscle weakness (generalized)     Problem List Patient Active Problem List   Diagnosis Date Noted  . Screening breast examination 05/11/2019  . LGSIL on Pap smear of cervix 05/11/2019  . Breast lump on right side at 11 o'clock position 05/11/2019  . Breast lump on left side at 9 o'clock position 05/11/2019  . Low grade squamous intraepith lesion on cytologic smear cervix (lgsil) 09/03/2017  . Family history of diabetes mellitus in father 02/25/2017  . Anemia 11/28/2016  . Pilonidal cyst without abscess 05/03/2016  . Vitamin D deficiency 03/16/2015  . Allergic rhinitis 03/15/2015  . Tinea pedis 03/15/2015  . Thickened endometrium 09/22/2014  . Dysmenorrhea 07/14/2014  . Tension headache 07/14/2014  . GERD (gastroesophageal reflux disease) 07/14/2014    07/16/2014, PT,  DPT 12/25/2020, 10:12 AM  [facility] [place] [facility] [address] [place], [place], [place] Phone: [phone]   Fax:  [phone]  Name: Cheryl Espinoza MRN: [name] Date of Birth: December 07, 1975

## 2020-12-28 ENCOUNTER — Ambulatory Visit: Payer: Self-pay

## 2020-12-28 ENCOUNTER — Other Ambulatory Visit: Payer: Self-pay

## 2020-12-28 DIAGNOSIS — M25675 Stiffness of left foot, not elsewhere classified: Secondary | ICD-10-CM

## 2020-12-28 DIAGNOSIS — M25674 Stiffness of right foot, not elsewhere classified: Secondary | ICD-10-CM

## 2020-12-28 DIAGNOSIS — M79671 Pain in right foot: Secondary | ICD-10-CM

## 2020-12-28 DIAGNOSIS — M79672 Pain in left foot: Secondary | ICD-10-CM

## 2020-12-28 DIAGNOSIS — M6281 Muscle weakness (generalized): Secondary | ICD-10-CM

## 2020-12-28 NOTE — Therapy (Signed)
[place] [place] [employer] [place], [place], [address] Phone: [phone]   Fax:  [phone]  Physical Therapy Treatment Progress Note Reporting Period 11/06/20 to 12/28/20  See note below for Objective Data and Assessment of Progress/Goals.       Patient Details  Name: Cheryl Espinoza MRN: [id number] Date of Birth: September 30, 1975 No data recorded  Encounter Date: 12/28/2020   PT End of Session - 12/28/20 0929    Visit Number 10    Date for PT Re-Evaluation 01/01/21    Authorization Type CAFA    PT Start Time 0929    PT Stop Time 1008    PT Time Calculation (min) 39 min    Activity Tolerance Patient tolerated treatment well    Behavior During Therapy [facility] for tasks assessed/performed           Past Medical History:  Diagnosis Date  . GERD (gastroesophageal reflux disease)   . History of abnormal cervical Pap smear   . History of vitamin D deficiency   . Tension headache     Past Surgical History:  Procedure Laterality Date  . CESAREAN SECTION  2000, 2002     There were no vitals filed for this visit.   Subjective Assessment - 12/28/20 0933    Subjective Still with less pain than before    Pertinent History unremarkable    Patient Stated Goals to decrease pain    Currently in Pain? Yes    Pain Score 2     Pain Location Foot    Pain Orientation Right;Left    Pain Descriptors / Indicators Burning              [facility] PT Assessment - 12/28/20 0001      AROM   Overall AROM Comments Ankle DF B:  10 Active, 12 Passive. Rests in 15 deg PF      Strength   Overall Strength Comments Hip flex  4+/5 B. B knee ext 4+/5. B knee flex 4+/5. B ankle DF/PF 4+/5.                         [facility] Adult PT Treatment/Exercise - 12/28/20 0001      Knee/Hip Exercises: Stretches   Gastroc Stretch Both;5 reps;30 seconds   edge of step     Knee/Hip Exercises: Standing   Heel Raises Limitations off  edge of step x15, emphasis on eccentric    Knee Flexion Strengthening;Both;1 set;10 reps   yellow TB   Other Standing Knee Exercises tandem balance 2x 20" B. SL on airex with cone taps x3 - 2 sets of 5. alot of ankle rocking ML istability      Ankle Exercises: Aerobic   Recumbent Bike L5 x 5 min      Ankle Exercises: Standing   Balance Beam airex x3 laps each tandem forward and backward, side stepping.          Firm surface tandem walks fwd/bwd  SLS x 20" B with some trunk compensations.   Piriformis stretch x 20" B       PT Education - 12/28/20 1006    Education Details New HEP provided. Access Code: [email or web address]    Person(s) Educated Patient    Methods Explanation;Demonstration;Handout    Comprehension Verbalized understanding            PT Short Term Goals - 11/20/20 1005      PT SHORT TERM GOAL #  1   Title Independent with inital HEP    Status Achieved             PT Long Term Goals - 12/28/20 0936      PT LONG TERM GOAL #1   Title Independent with advanced HEP    Status On-going      PT LONG TERM GOAL #2   Title Pt will demo improved ankle DF AROM to at least 10 deg bilateral without pain    Status Partially Met   achieved ROM goals but with low level continuous pain (decreased from initial to 2/10 currently)     PT LONG TERM GOAL #3   Title Pt will report </= 2/10 foot pain after full day of work    Status Achieved   no more than 2/10 pain during work day     PT LONG TERM GOAL #4   Title BLE strength 4+/5    Status Achieved                 Plan - 12/28/20 0934    Clinical Impression Statement Yzabelle has made excellent progress towards goals for bilateral plantar fasciitis. Patient able to tolerate all activities without increase of pain and reports less pain when on her feet on the days she works. Continues to make some progress with ankle ROM and BLE strength, gastroc tightness decreasing. Functional ROM seems to be getting easier and smoother.  Updated HEP provided to include ankle foot mobility/strength in addition to proprioception.Plan for next visit to be patient's last with discharge to home exercise program.    Examination-Activity Limitations Locomotion Level    Examination-Participation Restrictions Occupation    Rehab Potential Good    PT Frequency 2x / week    PT Treatment/Interventions ADLs/Self Care Home Management;Cryotherapy;Electrical Stimulation;Iontophoresis 4mg /ml Dexamethasone;Moist Heat;Neuromuscular re-education;Gait training;Therapeutic exercise;Functional mobility training;Patient/family education;Taping;Vasopneumatic Device;Manual techniques    PT Next Visit Plan Reassess new HEP next visit. Modify as needed with plan to discharge to independent HEP next visit.    Consulted and Agree with Plan of Care Patient           Patient will benefit from skilled therapeutic intervention in order to improve the following deficits and impairments:  Abnormal gait,Decreased range of motion,Difficulty walking,Pain,Improper body mechanics,Impaired flexibility,Hypomobility,Decreased balance,Decreased mobility,Decreased strength,Increased edema  Visit Diagnosis: Stiffness of left foot, not elsewhere classified  Pain in left foot  Pain in right foot  Stiffness of right foot, not elsewhere classified  Muscle weakness (generalized)     Problem List Patient Active Problem List   Diagnosis Date Noted  . Screening breast examination 05/11/2019  . LGSIL on Pap smear of cervix 05/11/2019  . Breast lump on right side at 11 o'clock position 05/11/2019  . Breast lump on left side at 9 o'clock position 05/11/2019  . Low grade squamous intraepith lesion on cytologic smear cervix (lgsil) 09/03/2017  . Family history of diabetes mellitus in father 02/25/2017  . Anemia 11/28/2016  . Pilonidal cyst without abscess 05/03/2016  . Vitamin D deficiency 03/16/2015  . Allergic rhinitis 03/15/2015  . Tinea pedis 03/15/2015  .  Thickened endometrium 09/22/2014  . Dysmenorrhea 07/14/2014  . Tension headache 07/14/2014  . GERD (gastroesophageal reflux disease) 07/14/2014    Hall Busing, PT, DPT 12/28/2020, 10:12 AM  [place] [place], [place], [address] Phone: [phone]   Fax:  [phone]  Name: Bridgett Hattabaugh MRN: [id number] Date of Birth: 1976-05-16

## 2020-12-28 NOTE — Patient Instructions (Signed)
(  provided in spanish) Access Code: [email or web address] URL: [email or web address] Date: 12/28/2020 Prepared by: Claude Manges  Exercises Seated Figure 4 Piriformis Stretch - 1 x daily - 7 x weekly - 6 sets - 30 seconds hold Standing Bilateral Gastroc Stretch with Step - 1 x daily - 7 x weekly - 6 sets - 30 seconds hold Eccentric Heel Lowering on Step - 1 x daily - 7 x weekly - 3 sets - 10 reps Standing Hamstring Curl with Resistance - 1 x daily - 7 x weekly - 3 sets - 10 reps Single Leg Stance - 1 x daily - 7 x weekly - 10 reps - 10-20 seconds hold Walking Tandem Stance - 1 x daily - 7 x weekly - 3 sets - 10 reps Goblet Squat with Kettlebell - 1 x daily - 7 x weekly - 3 sets - 10 reps

## 2021-01-01 ENCOUNTER — Other Ambulatory Visit: Payer: Self-pay

## 2021-01-01 ENCOUNTER — Ambulatory Visit: Payer: Self-pay

## 2021-01-01 DIAGNOSIS — M79671 Pain in right foot: Secondary | ICD-10-CM

## 2021-01-01 DIAGNOSIS — M25674 Stiffness of right foot, not elsewhere classified: Secondary | ICD-10-CM

## 2021-01-01 DIAGNOSIS — M6281 Muscle weakness (generalized): Secondary | ICD-10-CM

## 2021-01-01 DIAGNOSIS — M25675 Stiffness of left foot, not elsewhere classified: Secondary | ICD-10-CM

## 2021-01-01 DIAGNOSIS — M79672 Pain in left foot: Secondary | ICD-10-CM

## 2021-01-01 NOTE — Therapy (Signed)
[place]. [place], [place], [address] Phone: [phone]   Fax:  [phone]  Physical Therapy Treatment/ Discharge  Patient Details  Name: Cheryl Espinoza MRN: [id number] Date of Birth: 10-May-1976 No data recorded  Encounter Date: 01/01/2021   PT End of Session - 01/01/21 0940    Visit Number 11    Date for PT Re-Evaluation 01/01/21    Authorization Type CAFA    PT Start Time 0930    PT Stop Time 1008    PT Time Calculation (min) 38 min    Activity Tolerance Patient tolerated treatment well    Behavior During Therapy [facility] for tasks assessed/performed           Past Medical History:  Diagnosis Date  . GERD (gastroesophageal reflux disease)   . History of abnormal cervical Pap smear   . History of vitamin D deficiency   . Tension headache     Past Surgical History:  Procedure Laterality Date  . CESAREAN SECTION  2000, 2002     There were no vitals filed for this visit.   Subjective Assessment - 01/01/21 0938    Subjective Doing well. Less pain than before    Pertinent History unremarkable    Patient Stated Goals to decrease pain    Currently in Pain? Yes    Pain Score 1     Pain Location Foot    Pain Orientation Right;Left    Pain Descriptors / Indicators Burning                             [facility] Adult PT Treatment/Exercise - 01/01/21 0001      Knee/Hip Exercises: Stretches   Piriformis Stretch Both;1 rep;20 seconds    Gastroc Stretch Both;30 seconds   6 reps     Knee/Hip Exercises: Standing   Heel Raises Limitations off edge of step 2x15, emphasis on eccentric    Knee Flexion Strengthening;Both;10 reps;2 sets   yellow TB   Other Standing Knee Exercises tandem balance 2x 20" B. tandem walk fwd and bwd      Ankle Exercises: Aerobic   Nustep L3 x 4 min BLE ONLY           Leg press 2 x 10 40# BLE Sit to stand from airex with 5#, NBOS x10 Split stance sit  to stand from mat 2x10 B, cues to controll knee hyperext         PT Education - 01/01/21 0939    Education Details Reinforced last updated HEP for discharge. [email or web address]. Reminded regarding use of ice if gets any flareups of plantar heel pain/inflammation. Pt VU   Person(s) Educated Patient    Methods Explanation;Demonstration    Comprehension Verbalized understanding            PT Short Term Goals - 11/20/20 1005      PT SHORT TERM GOAL #1   Title Independent with inital HEP    Status Achieved             PT Long Term Goals - 01/01/21 0942      PT LONG TERM GOAL #1   Title Independent with advanced HEP    Status Achieved   Reports she has done new exercises each day     PT LONG TERM GOAL #2   Title Pt will demo improved ankle DF AROM to at least 10 deg bilateral without  pain    Status Partially Met   achieved ROM goals but with low level continuous pain (decreased from initial to 2/10 currently)     PT LONG TERM GOAL #3   Title Pt will report </= 2/10 foot pain after full day of work    Status Achieved   no more than 2/10 pain during work day     PT LONG TERM GOAL #4   Title BLE strength 4+/5    Status Achieved                 Plan - 01/01/21 0940    Clinical Impression Statement Cheryl Espinoza has made excellent progress towards goals for B plantar fasciitis. Patient able to tolerate all activities without increase of pain and reports less pain when on her feet on the days she works. Reinforced Updated HEP today for ankle foot mobility/strength in addition to proprioception. Pt is pleased with functional improvements and is agreeable to discharge from PT at this time    Examination-Activity Limitations Locomotion Level    Examination-Participation Restrictions Occupation    Rehab Potential Good    PT Treatment/Interventions ADLs/Self Care Home Management;Cryotherapy;Electrical Stimulation;Iontophoresis 4mg /ml Dexamethasone;Moist Heat;Neuromuscular re-education;Gait  training;Therapeutic exercise;Functional mobility training;Patient/family education;Taping;Vasopneumatic Device;Manual techniques    PT Next Visit Plan discharge to independent HEP    Consulted and Agree with Plan of Care Patient           Patient will benefit from skilled therapeutic intervention in order to improve the following deficits and impairments:  Abnormal gait,Decreased range of motion,Difficulty walking,Pain,Improper body mechanics,Impaired flexibility,Hypomobility,Decreased balance,Decreased mobility,Decreased strength,Increased edema  Visit Diagnosis: Stiffness of left foot, not elsewhere classified  Pain in left foot  Pain in right foot  Stiffness of right foot, not elsewhere classified  Muscle weakness (generalized)     Problem List Patient Active Problem List   Diagnosis Date Noted  . Screening breast examination 05/11/2019  . LGSIL on Pap smear of cervix 05/11/2019  . Breast lump on right side at 11 o'clock position 05/11/2019  . Breast lump on left side at 9 o'clock position 05/11/2019  . Low grade squamous intraepith lesion on cytologic smear cervix (lgsil) 09/03/2017  . Family history of diabetes mellitus in father 02/25/2017  . Anemia 11/28/2016  . Pilonidal cyst without abscess 05/03/2016  . Vitamin D deficiency 03/16/2015  . Allergic rhinitis 03/15/2015  . Tinea pedis 03/15/2015  . Thickened endometrium 09/22/2014  . Dysmenorrhea 07/14/2014  . Tension headache 07/14/2014  . GERD (gastroesophageal reflux disease) 07/14/2014    PHYSICAL THERAPY DISCHARGE SUMMARY  Visits from Start of Care: 11  Plan: Patient agrees to discharge.  Patient goals were met. Patient is being discharged due to being pleased with the current functional level.  ?????        Hall Busing , PT, DPT 01/01/2021, 10:04 AM  [place], [place], [address] Phone: [phone]   Fax:   [phone]  Name: Cheryl Espinoza MRN: [id number] Date of Birth: 07-17-1976

## 2021-01-15 ENCOUNTER — Ambulatory Visit: Payer: Self-pay | Attending: Internal Medicine

## 2021-01-15 ENCOUNTER — Other Ambulatory Visit: Payer: Self-pay

## 2021-01-25 ENCOUNTER — Ambulatory Visit: Payer: Self-pay | Admitting: Podiatry

## 2021-02-12 ENCOUNTER — Encounter: Payer: Self-pay | Admitting: Podiatry

## 2021-02-12 ENCOUNTER — Ambulatory Visit (INDEPENDENT_AMBULATORY_CARE_PROVIDER_SITE_OTHER): Payer: Self-pay | Admitting: Podiatry

## 2021-02-12 ENCOUNTER — Other Ambulatory Visit: Payer: Self-pay

## 2021-02-12 DIAGNOSIS — M722 Plantar fascial fibromatosis: Secondary | ICD-10-CM

## 2021-02-12 NOTE — Patient Instructions (Signed)
Look for gel inserts such as Dr Margart Sickles  can be found in the foot care section in the pharmacy

## 2021-02-13 NOTE — Progress Notes (Signed)
  Subjective:  Patient ID: Cheryl Espinoza, female    DOB: 1975/10/19,  MRN: [id number]  Chief Complaint  Patient presents with   Follow-up    Pt states that she doesn't have pain but a burning sensation after being on feet for a while.     45 y.o. female returns for follow-up with the above complaint. History confirmed with patient. Translation provided in person from [place] to [place].  Overall doing better she does not have the same pain that she had been having before.  Does have some achiness after being on her feet for several hours at work  Objective:  Physical Exam: warm, good capillary refill, no trophic changes or ulcerative lesions, normal DP and PT pulses and normal sensory exam.  Today she has no pain on palpation to either heel  Radiographs: X-ray of both feet: no fracture, dislocation, swelling or degenerative changes noted and plantar calcaneal spur Assessment:   1. Plantar fasciitis of right foot   2. Plantar fasciitis of left foot      Plan:  Patient was evaluated and treated and all questions answered.  She is doing very well I advised her to continue her stretching and therapy exercises if this returns.  Continue meloxicam as needed.  Advised for the achiness she has in the heels to cushion the fat pad with a gel insole which she can get at the pharmacy.  Return as needed

## 2021-02-20 IMAGING — MG DIGITAL DIAGNOSTIC BILAT W/ TOMO W/ CAD
6 of 10 series · 6 of 30 positions shown · non-contrast
Comparison: Previous exam(s).

CLINICAL DATA: 44-year-old female presenting with a new palpable
area of concern felt by the patient's physician in the lower outer
right breast.

EXAM:
DIGITAL DIAGNOSTIC BILATERAL MAMMOGRAM WITH CAD
ULTRASOUND RIGHT BREAST
TECHNIQUE: Bilateral digital diagnostic mammography was performed. Digital
images of the breasts were evaluated with computer-aided detection.
Targeted ultrasound examination of the Right breast was performed

[R TAN synth-2D]
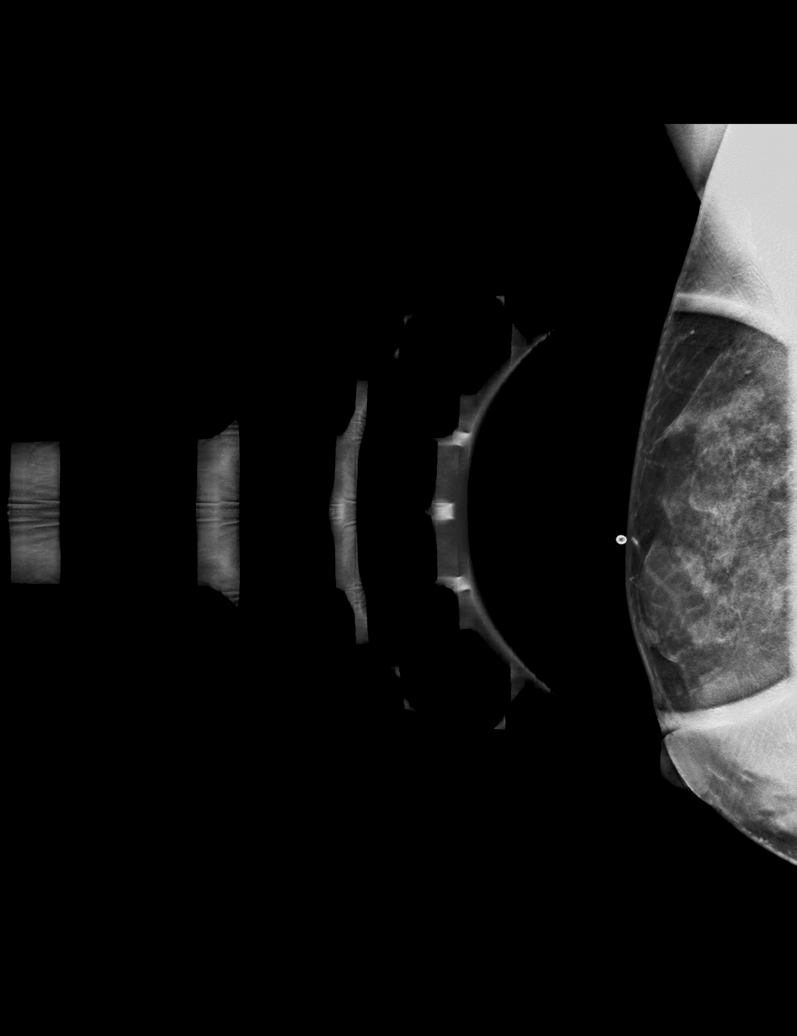

[L CC synth-2D]
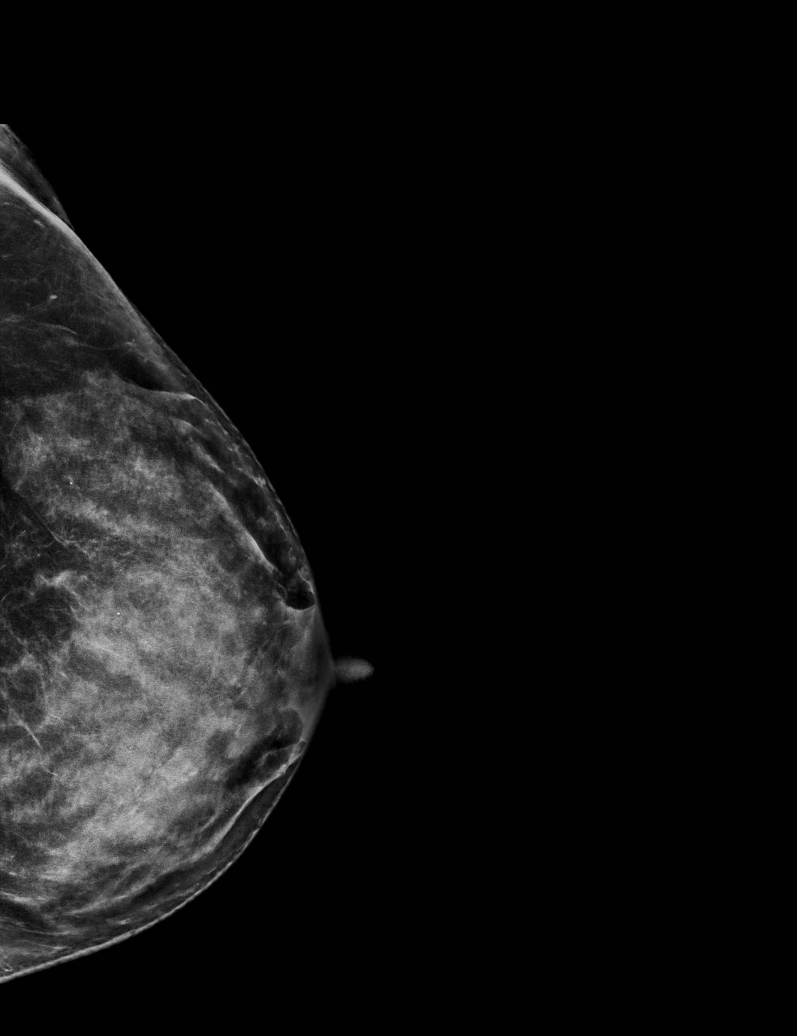

[R CC synth-2D]
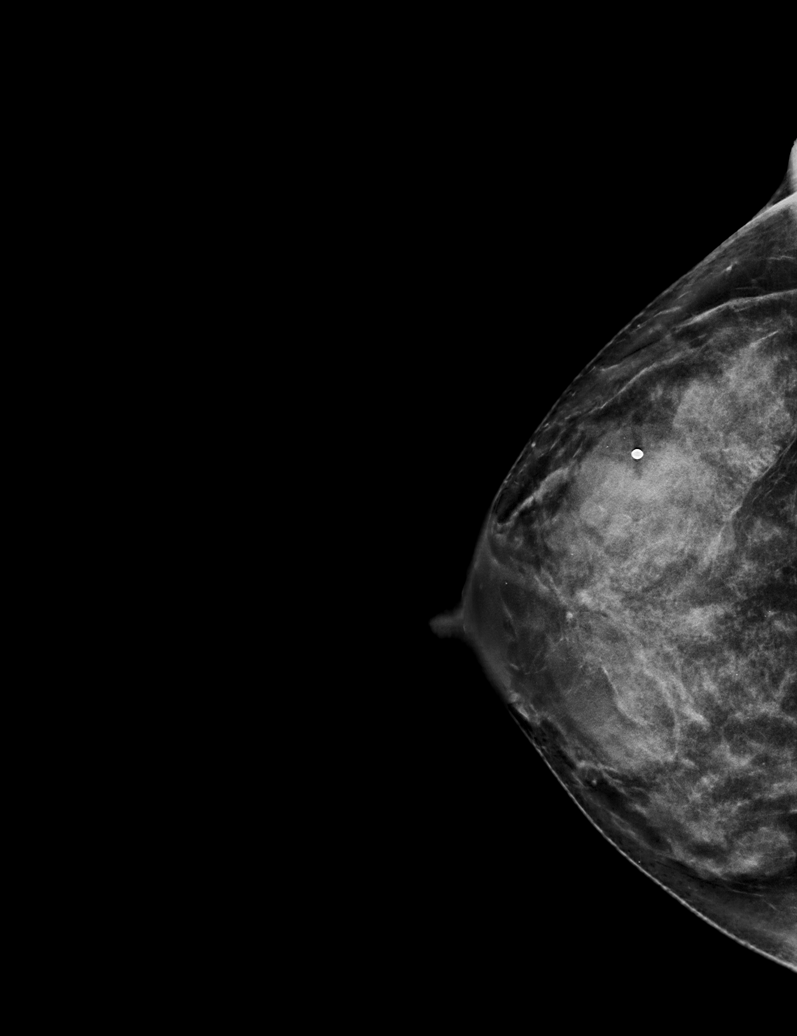

[R MLO synth-2D]
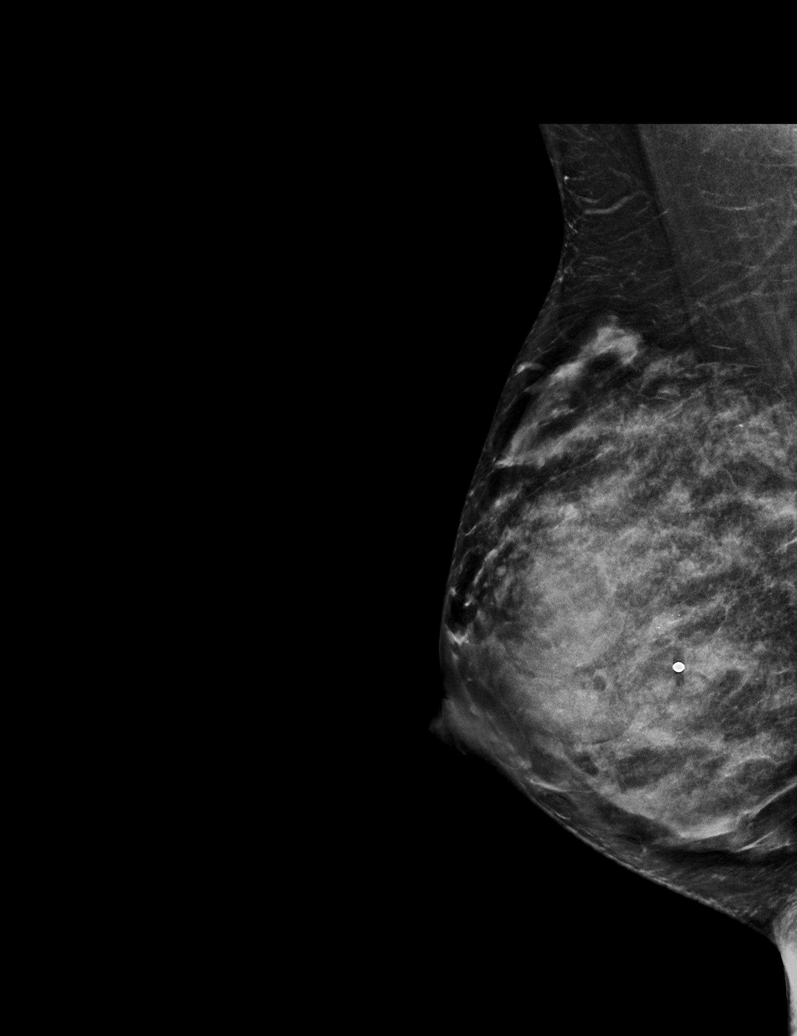

[L MLO synth-2D]
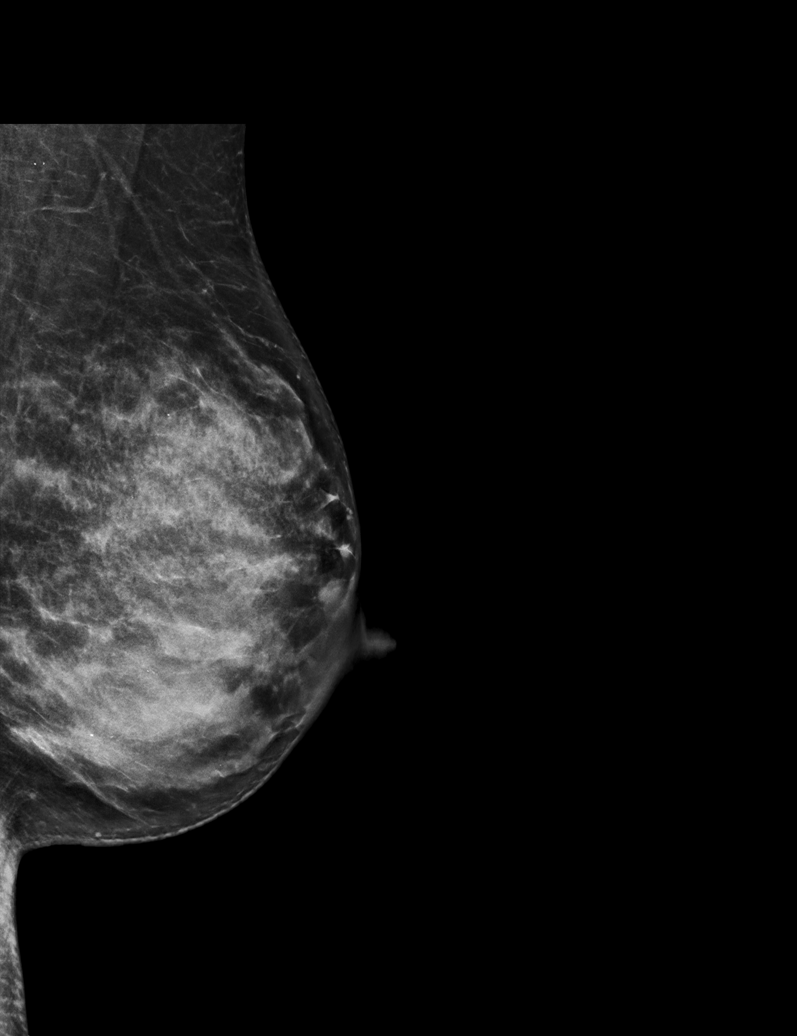

[L MLO tomo · tomo slice 33/65.0]
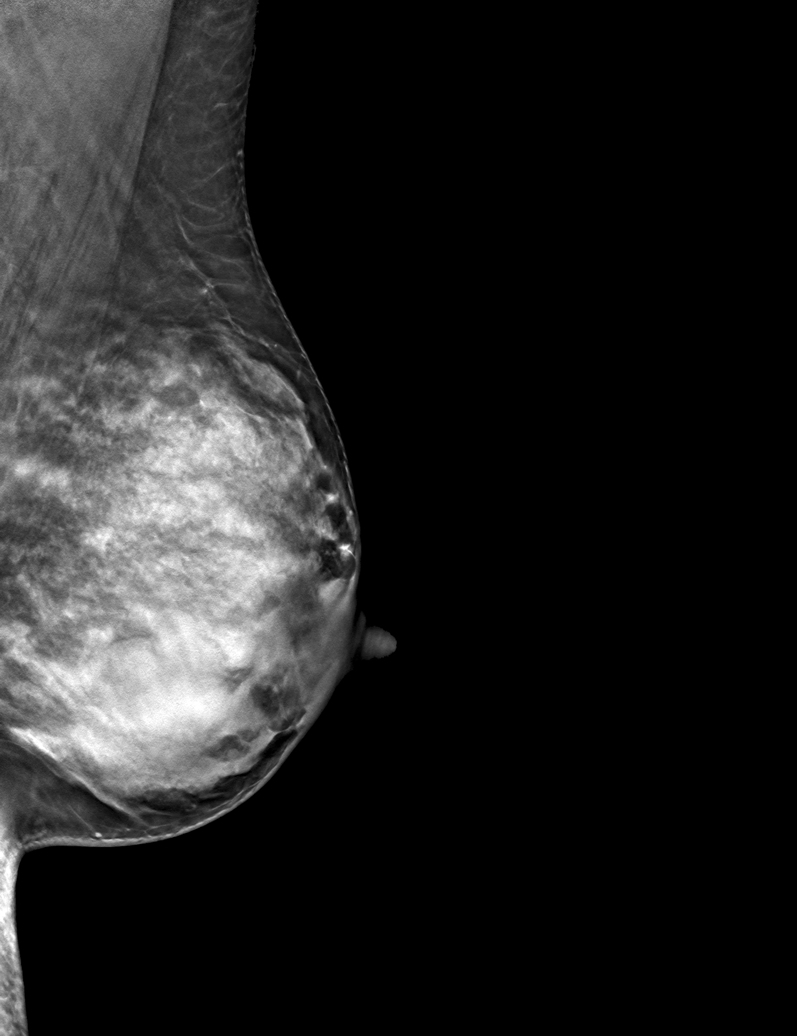

[6 of 30 positions shown; findings below may reference images not displayed]

ACR Breast Density Category d: The breast tissue is extremely dense,
which lowers the sensitivity of mammography.
FINDINGS: Mammogram:

Right breast: A skin BB marks the site of concern reported by the
patient's physician in the lower outer right breast. A spot
tangential view of this area is were performed in addition to
standard views. There is an oval circumscribed mass at the palpable
site measuring approximately 2.3 cm.

Left breast: No suspicious mass, distortion, or microcalcifications
are identified to suggest presence of malignancy.

There are multiple bilateral fluctuating circumscribed oval masses,
consistent with history of cysts.

On physical exam, I feel a discrete mass at the site of concern in
the lower outer right breast.

Ultrasound:

Targeted ultrasound is performed at the palpable site of concern in
the right breast at 8 o'clock 3 cm from the nipple demonstrating an
oval circumscribed anechoic mass with posterior enhancement
measuring 2.3 x 1.7 x 1.7 cm, consistent with a benign simple cyst.
This corresponds to the mammographic abnormality.
IMPRESSION: 1. At the palpable site of concern in the right breast at 8 o'clock
there is a benign simple cyst.

2.  No mammographic evidence of malignancy in the bilateral breasts.

RECOMMENDATION:
Screening mammogram in one year.(Code:[id number]-[id number])

I have discussed the findings and recommendations with the patient.
If applicable, a reminder letter will be sent to the patient
regarding the next appointment.

BI-RADS CATEGORY  2: Benign.

## 2021-02-22 ENCOUNTER — Encounter: Payer: Self-pay | Admitting: Internal Medicine

## 2021-02-22 ENCOUNTER — Other Ambulatory Visit: Payer: Self-pay

## 2021-02-22 ENCOUNTER — Ambulatory Visit: Payer: Self-pay | Attending: Internal Medicine | Admitting: Internal Medicine

## 2021-02-22 VITALS — BP 132/86 | HR 88 | Resp 16 | Ht 60.5 in | Wt 154.2 lb

## 2021-02-22 DIAGNOSIS — D500 Iron deficiency anemia secondary to blood loss (chronic): Secondary | ICD-10-CM

## 2021-02-22 DIAGNOSIS — E663 Overweight: Secondary | ICD-10-CM | POA: Insufficient documentation

## 2021-02-22 DIAGNOSIS — Z0000 Encounter for general adult medical examination without abnormal findings: Secondary | ICD-10-CM

## 2021-02-22 DIAGNOSIS — R030 Elevated blood-pressure reading, without diagnosis of hypertension: Secondary | ICD-10-CM

## 2021-02-22 DIAGNOSIS — E559 Vitamin D deficiency, unspecified: Secondary | ICD-10-CM

## 2021-02-22 DIAGNOSIS — L309 Dermatitis, unspecified: Secondary | ICD-10-CM

## 2021-02-22 DIAGNOSIS — R42 Dizziness and giddiness: Secondary | ICD-10-CM

## 2021-02-22 DIAGNOSIS — Z1159 Encounter for screening for other viral diseases: Secondary | ICD-10-CM

## 2021-02-22 MED ORDER — METRONIDAZOLE 1 % EX GEL
Freq: Every day | CUTANEOUS | 0 refills | Status: DC
  Filled 2021-02-22: qty 60, 15d supply, fill #0

## 2021-02-22 NOTE — Patient Instructions (Signed)
Cuidados preventivos en las mujeres de 40 a 64 aos de edad Preventive Care 40-43 Years Old, Female Los cuidados preventivos hacen referencia a las opciones en cuanto al estilo de vida y a las visitas al mdico, las cuales pueden promover la salud y el bienestar. Esto puede comprender lo siguiente: Un examen fsico anual. Esto tambin se conoce como visita de control de bienestar anual. Exmenes dentales y oculares de manera regular. Vacunas. Estudios para detectar ciertas afecciones. Elecciones para un estilo de vida saludable, por ejemplo: Seguir una dieta saludable. Practicar actividad fsica con regularidad. No consumir drogas ni productos que contengan nicotina y tabaco. Limitar el consumo de bebidas alcohlicas. Qu puedo esperar para mi visita de cuidado preventivo? Examen fsico El mdico revisar lo siguiente: Estatura y peso. Estos pueden usarse para calcular el IMC (ndice de masa corporal). El IMC es una medicin que indica si tiene un peso saludable. Frecuencia cardaca y presin arterial. Temperatura corporal. Piel para detectar manchas anormales. Asesoramiento Su mdico puede preguntarle acerca de: Problemas mdicos pasados. Antecedentes mdicos familiares. Consumo de tabaco, alcohol y drogas. Su bienestar emocional. Bienestar en el hogar y las relaciones personales. Su actividad sexual. Hbitos de alimentacin, ejercicio y sueo. Su trabajo y ambiente laboral. Acceso a armas de fuego. Mtodos anticonceptivos. Ciclo menstrual. Antecedentes de embarazo. Qu vacunas necesito?  Las vacunas se aplican a varias edades, segn un calendario. El mdico le recomendar vacunas segn su edad, sus antecedentes mdicos, su estilo de viday otros factores, como los viajes o el lugar donde trabaja. Qu pruebas necesito? Anlisis de sangre Niveles de lpidos y colesterol. Estos se pueden verificar cada 5 aos, o ms a menudo, si usted tiene ms de 50 aos de edad. Anlisis de  hepatitis C. Anlisis de hepatitis B. Pruebas de deteccin Pruebas de deteccin de cncer de pulmn. Es posible que se le realice esta prueba de deteccin a partir de los 55 aos de edad, si ha fumado durante 30 aos un paquete diario y sigue fumando o dej el hbito en algn momento en los ltimos 15 aos. Pruebas de deteccin de cncer colorrectal. Todos los adultos a partir de los 50 aos de edad y hasta los 75 aos de edad deben hacerse esta prueba de deteccin. El mdico puede recomendarle las pruebas de deteccin a partir de los 45 aos de edad si corre un mayor riesgo. Le realizarn pruebas cada 1 a 10 aos, segn los resultados y el tipo de prueba de deteccin. Pruebas de deteccin de la diabetes. Esto se realiza mediante un control del azcar en la sangre (glucosa) despus de no haber comido durante un periodo de tiempo (ayuno). Es posible que se le realice esta prueba cada 1 a 3 aos. Mamografa. Se puede realizar cada 1 o 2 aos. Hable con su mdico sobre cundo debe comenzar a realizarse mamografas de manera regular. Esto depende de si tiene antecedentes familiares de cncer de mama o no. Pruebas de deteccin de cncer relacionado con las mutaciones del BRCA. Es posible que se las deba realizar si tiene antecedentes de cncer de mama, de ovario, de trompas o peritoneal. Examen plvico y prueba de Papanicolaou. Esto se puede realizar cada 3 aos a partir de los 21 aos de edad. A partir de los 30 aos, esto se puede realizar cada 5 aos si usted se realiza una prueba de Papanicolaou en combinacin con una prueba de deteccin del virus del papiloma humano (VPH). Otras pruebas Pruebas de enfermedades de transmisin sexual (ETS), si est en   riesgo. Densitometra sea. Esto se realiza para detectar osteoporosis. Se le puede realizar este examen de deteccin si tiene un riesgo alto de tener osteoporosis. Hable con su mdico sobre los resultados de las pruebas, las opciones detratamiento  y, si corresponde, la necesidad de realizar ms pruebas. Siga estas instrucciones en su casa: Comida y bebida  Siga una dieta que incluya frutas y verduras frescas, cereales integrales, protenas magras y productos lcteos descremados. Tome los suplementos vitamnicos y minerales como se lo haya indicado el mdico. No beba alcohol si: Su mdico le indica no hacerlo. Est embarazada, puede estar embarazada o est tratando de quedar embarazada. Si bebe alcohol: Limite la cantidad que consume de 0 a 1 medida por da. Est atenta a la cantidad de alcohol que hay en las bebidas que toma. En los [place], una medida equivale a una botella de cerveza de 12 oz (355 ml), un vaso de vino de 5 oz (148 ml) o un vaso de una bebida alcohlica de alta graduacin de 1 oz (44 ml).  Estilo de vida Cudese los dientes y las encas a diario. Cepllese los dientes a la maana y a la noche con pasta dental con fluoruro. Use hilo dental una vez al da. Mantngase activa. Haga al menos 30 minutos de ejercicio, 5 o ms das cada semana. No consuma ningn producto que contenga nicotina o tabaco, como cigarrillos, cigarrillos electrnicos y tabaco de mascar. Si necesita ayuda para dejar de fumar, consulte al mdico. No consuma drogas. Si es sexualmente activa, practique sexo seguro. Use un condn u otra forma de proteccin para prevenir las ITS (infecciones de transmisin sexual). Si no desea quedar embarazada, use un mtodo anticonceptivo. Si busca un embarazo, realice una consulta previa al embarazo con el mdico. Si el mdico se lo indic, tome una dosis baja de aspirina diariamente a partir de los 50 aos de edad. Encuentre formas saludables de lidiar con el estrs tales como: Meditacin, yoga o escuchar msica. Lleve un diario personal. Hable con una persona confiable. Pase tiempo con amigos y familiares. Seguridad Usa siempre el cinturn de seguridad al conducir o viajar en un vehculo. No  conduzca: Si ha estado bebiendo alcohol. No viaje con un conductor que ha estado bebiendo. Si est cansada o distrada. Mientras est enviando mensajes de texto. Use un casco y otros equipos de proteccin durante las actividades deportivas. Si tiene armas de fuego en su casa, asegrese de seguir todos los procedimientos de seguridad correspondientes. Cundo volver? Visite al mdico una vez al ao para una visita anual de control de bienestar. Pregntele al mdico con qu frecuencia debe realizarse un control de la vista y los dientes. Mantenga su esquema de vacunacin al da. Esta informacin no tiene como fin reemplazar el consejo del mdico. Asegresede hacerle al mdico cualquier pregunta que tenga. Document Revised: 06/14/2020 Document Reviewed: 06/14/2020 Elsevier Patient Education  2022 [employer]  

## 2021-02-22 NOTE — Progress Notes (Signed)
Patient ID: Cheryl Espinoza, female    DOB: 10/24/1975  MRN: [id number]  CC: re-establish, Annual Exam, and Medication Refill   Subjective: Cheryl Espinoza is a 45 y.o. female who presents for annual exam Her concerns today include:  Patient with history of GERD, allergic rhinitis, vitamin D deficiency, migraine, prediabetes, RT breast cyst, IDA, plantar fasciitis  C/o feeling dizzy x 3 wks.  Dizziness last about 5 mins when it comes on.  Sometimes it occurs when walking and she has to stop because it feels like room spinning.  No dizziness with position changes. No associated decrease hearing in ears or ringing in ears.  Hx of iron def anemia.  Reports taking iron OTC BID for past several mths. Reports regular menses that last about 5 days.  Menses heavy last mth. Menses alternate b/w light and heavy some mths  Sister and dad with DM.  Concern that she may have it too.  No frequent thirst or urination.  Has had wgh gain but according to our scale she has loss 7 lbs since 08/2020.  Still overwgh for height Drinks adequate fluids during the day. Not due for MMG or pap  Hx of vit D def:  taking Vit D supplement from OTC  Rash on cheeks x 1 mth. Red, no itching.  Worse when she sweats or gets hot. Thinks may be allergic rxn to surgical mask but she has been wearing surgical mask intermittently since COVID pandemic began 2 yrs ago.  Cloth mask not as irritating.  She was using an OTC cream.  Went away then came back when weather got hot.  Endorses wearing liquid makeup but states she is used the same product for quite a while now.     HM:  Due for hep c screen, completed 2 shots of COVID vaccine.  She does not have card with her.    Patient Active Problem List   Diagnosis Date Noted   Screening breast examination 05/11/2019   LGSIL on Pap smear of cervix 05/11/2019   Breast lump on right side at 11 o'clock position 05/11/2019   Breast lump on left side at 9 o'clock  position 05/11/2019   Low grade squamous intraepith lesion on cytologic smear cervix (lgsil) 09/03/2017   Family history of diabetes mellitus in father 02/25/2017   Anemia 11/28/2016   Pilonidal cyst without abscess 05/03/2016   Vitamin D deficiency 03/16/2015   Allergic rhinitis 03/15/2015   Tinea pedis 03/15/2015   Thickened endometrium 09/22/2014   Dysmenorrhea 07/14/2014   Tension headache 07/14/2014   GERD (gastroesophageal reflux disease) 07/14/2014     Current Outpatient Medications on File Prior to Visit  Medication Sig Dispense Refill   cetirizine (ZYRTEC) 10 MG tablet TAKE 1 TABLET (10 MG TOTAL) BY MOUTH DAILY. 30 tablet 1   fluticasone (FLONASE) 50 MCG/ACT nasal spray PLACE 2 SPRAYS INTO BOTH NOSTRILS DAILY. 16 g 0   ibuprofen (ADVIL,MOTRIN) 600 MG tablet Take 1 tablet (600 mg total) by mouth every 8 (eight) hours as needed (for chest wall pain; take after eating). 30 tablet 0   meloxicam (MOBIC) 15 MG tablet TAKE 1 TABLET (15 MG TOTAL) BY MOUTH DAILY FOR 10 DAYS THEN AS NEEDED FOR PAIN 30 tablet 2   olopatadine (PATANOL) 0.1 % ophthalmic solution PLACE 1 DROP INTO BOTH EYES 2 (TWO) TIMES DAILY. (Patient not taking: Reported on 09/14/2020) 5 mL 0   SUMAtriptan (IMITREX) 50 MG tablet TAKE 1 TABLET BY MOUTH AT  THE START OF THE HEADACHE. MAY REPEAT IN 2 HOURS 1 TIMES IF HEADACHE PERSISTS. MAX OF 2 TABS/24 HOURS 10 tablet 2   No current facility-administered medications on file prior to visit.    No Known Allergies  Social History   Socioeconomic History   Marital status: Single    Spouse name: Not on file   Number of children: 3    Years of education: 9    Highest education level: 9th grade  Occupational History   Occupation: Restaurant [employer]   Tobacco Use   Smoking status: Never   Smokeless tobacco: Never  Vaping Use   Vaping Use: Never used  Substance and Sexual Activity   Alcohol use: No    Alcohol/week: 0.0 standard drinks   Drug use: No   Sexual activity: Yes     Birth control/protection: Pill  Other Topics Concern   Not on file  Social History Narrative   Lives at home with daughter.    43 yo daughter.    Two younger children live in [place]    Lived in [place] since 2004.    Social Determinants of Health   Financial Resource Strain: Not on file  Food Insecurity: Not on file  Transportation Needs: No Transportation Needs   Lack of Transportation (Medical): No   Lack of Transportation (Non-Medical): No  Physical Activity: Not on file  Stress: Not on file  Social Connections: Not on file  Intimate Partner Violence: Not on file    Family History  Problem Relation Age of Onset   Hyperlipidemia Mother    Diabetes Father    Diabetes Sister    Cancer Neg Hx    Heart disease Neg Hx     Past Surgical History:  Procedure Laterality Date   CESAREAN SECTION  2000, 2002     ROS: Review of Systems Negative except as stated above  PHYSICAL EXAM: BP 132/86   Pulse 88   Resp 16   Ht 5' 0.5" (1.537 m)   Wt 154 lb 3.2 oz (69.9 kg)   SpO2 100%   BMI 29.62 kg/m   Wt Readings from Last 3 Encounters:  02/22/21 154 lb 3.2 oz (69.9 kg)  09/14/20 161 lb 9.6 oz (73.3 kg)  08/31/20 161 lb 8 oz (73.3 kg)  BP sitting 125/87, P74 Standing 142/91 P80  Physical Exam  General appearance - alert, well appearing, middle-aged Hispanic female and in no distress Mental status - normal mood, behavior, speech, dress, motor activity, and thought processes Eyes - pupils equal and reactive, extraocular eye movements intact Ears - bilateral TM's and external ear canals normal Nose - normal and patent, no erythema, discharge or polyps Mouth - mucous membranes moist, pharynx normal without lesions Neck - supple, no significant adenopathy Lymphatics - no palpable lymphadenopathy, no hepatosplenomegaly Chest - clear to auscultation, no wheezes, rales or rhonchi, symmetric air entry Heart - normal rate, regular rhythm, normal S1, S2, no murmurs, rubs,  clicks or gallops Abdomen - soft, nontender, nondistended, no masses or organomegaly Neurological - cranial nerves II through XII intact, motor and sensory grossly normal bilaterally, Romberg sign negative, normal gait and station Musculoskeletal - no joint tenderness, deformity or swelling Extremities - peripheral pulses normal, no pedal edema, no clubbing or cyanosis Skin -slight erythematous, rough rash with easily seen venous capillaries on both cheeks   CMP Latest Ref Rng & Units 06/08/2020 05/18/2020 09/21/2018  Glucose 70 - 99 mg/dL 99 96 89  BUN 6 - 20  mg/dL 9 11 11   Creatinine 0.44 - 1.00 mg/dL 7.82) 9.56(O  Sodium 135 - 145 mmol/L 136 136 137  Potassium 3.5 - 5.1 mmol/L 4.0 4.2 4.5  Chloride 98 - 111 mmol/L 105 103 101  CO2 22 - 32 mmol/L 20(L) - 20  Calcium 8.9 - 10.3 mg/dL 9.2 9.1 9.2  Total Protein 6.5 - 8.1 g/dL 8.0 7.9 7.8  Total Bilirubin 0.3 - 1.2 mg/dL 0.3 1.30 0.3  Alkaline Phos 38 - 126 U/L 68 89 60  AST 15 - 41 U/L 35 18 17  ALT 0 - 44 U/L 28 - 13   Lipid Panel  No results found for: CHOL, TRIG, HDL, CHOLHDL, VLDL, LDLCALC, LDLDIRECT  CBC    Component Value Date/Time   WBC 9.6 06/08/2020 1646   RBC 3.86 (L) 06/08/2020 1646   HGB 10.1 (L) 06/08/2020 1646   HGB 9.4 (L) 05/18/2020 1504   HCT 34.1 (L) 06/08/2020 1646   HCT 29.4 (L) 05/18/2020 1504   PLT 377 06/08/2020 1646   PLT 414 05/18/2020 1504   MCV 88.3 06/08/2020 1646   MCV 82 05/18/2020 1504   MCH 26.2 06/08/2020 1646   MCHC 29.6 (L) 06/08/2020 1646   RDW 18.2 (H) 06/08/2020 1646   RDW 15.6 (H) 05/18/2020 1504   LYMPHSABS 2.9 06/08/2020 1646   LYMPHSABS 2.9 05/18/2020 1504   MONOABS 0.6 06/08/2020 1646   EOSABS 0.1 06/08/2020 1646   EOSABS 0.2 05/18/2020 1504   BASOSABS 0.0 06/08/2020 1646   BASOSABS 0.0 05/18/2020 1504    ASSESSMENT AND PLAN: 1. Annual physical exam   2. Dizziness Differential diagnoses include anemia versus Mnire's.  Latter seems less likely. Check CBC and  chemistry today. Encouraged her to drink several glasses of water a day to help keep her self hydrated.  3. Iron deficiency anemia due to chronic blood loss Continue iron supplement which she is taking over-the-counter. - CBC  4. Overweight (BMI 25.0-29.9) Commended her on weight loss.  Discussed and encourage healthy eating habits.  Encouraged her to get in some form of moderate intensity exercise for total of at least 150 minutes/week. - Lipid panel - Hemoglobin A1c - Comprehensive metabolic panel  5. Elevated blood-pressure reading without diagnosis of hypertension DASH diet discussed and encouraged.  We will plan to recheck blood pressure on subsequent visit.  Previous blood pressures have been okay.  6. Vitamin D deficiency - VITAMIN D 25 Hydroxy (Vit-D Deficiency, Fractures)  7. Need for hepatitis C screening test - Hepatitis C Antibody  8. Dermatitis of face Differential diagnosis include rosacea versus lupus versus contact dermatitis from mask wearing or make-up.  This looks more like rosacea.  Advised her to discontinue wearing the liquid make-up for now.   Give trial of MetroGel. - ANA w/Reflex if Positive - metroNIDAZOLE (METROGEL) 1 % gel; Apply topically daily. Apply to affected area once a day for 1 week then PRN  Dispense: 45 g; Refill: 0    Patient was given the opportunity to ask questions.  Patient verbalized understanding of the plan and was able to repeat key elements of the plan.  [employer] Language interpreter used during this encounter. 06-28-2001, #[id number]  No orders of the defined types were placed in this encounter.    Requested Prescriptions    No prescriptions requested or ordered in this encounter    No follow-ups on file.  Marquita Palms, MD, FACP

## 2021-02-23 ENCOUNTER — Other Ambulatory Visit: Payer: Self-pay

## 2021-02-23 LAB — CBC
Hematocrit: 35.2 % (ref 34.0–46.6)
Hemoglobin: 10.9 g/dL — ABNORMAL LOW (ref 11.1–15.9)
MCH: 27.7 pg (ref 26.6–33.0)
MCHC: 31 g/dL — ABNORMAL LOW (ref 31.5–35.7)
MCV: 90 fL (ref 79–97)
Platelets: 373 10*3/uL (ref 150–450)
RBC: 3.93 x10E6/uL (ref 3.77–5.28)
RDW: 14.8 % (ref 11.7–15.4)
WBC: 6.9 10*3/uL (ref 3.4–10.8)

## 2021-02-23 LAB — COMPREHENSIVE METABOLIC PANEL
ALT: 29 IU/L (ref 0–32)
AST: 27 IU/L (ref 0–40)
Albumin/Globulin Ratio: 1.4 (ref 1.2–2.2)
Albumin: 4.5 g/dL (ref 3.8–4.8)
Alkaline Phosphatase: 76 IU/L (ref 44–121)
BUN/Creatinine Ratio: 10 (ref 9–23)
BUN: 7 mg/dL (ref 6–24)
Bilirubin Total: 0.4 mg/dL (ref 0.0–1.2)
CO2: 22 mmol/L (ref 20–29)
Calcium: 9.9 mg/dL (ref 8.7–10.2)
Chloride: 101 mmol/L (ref 96–106)
Creatinine, Ser: 0.68 mg/dL (ref 0.57–1.00)
Globulin, Total: 3.3 g/dL (ref 1.5–4.5)
Glucose: 92 mg/dL (ref 65–99)
Potassium: 4.5 mmol/L (ref 3.5–5.2)
Sodium: 142 mmol/L (ref 134–144)
Total Protein: 7.8 g/dL (ref 6.0–8.5)
eGFR: 111 mL/min/{1.73_m2} (ref >59)

## 2021-02-23 LAB — HEMOGLOBIN A1C
Est. average glucose Bld gHb Est-mCnc: 123 mg/dL
Hgb A1c MFr Bld: 5.9 % — ABNORMAL HIGH (ref 4.8–5.6)

## 2021-02-23 LAB — LIPID PANEL
Chol/HDL Ratio: 5.8 ratio — ABNORMAL HIGH (ref 0.0–4.4)
Cholesterol, Total: 251 mg/dL — ABNORMAL HIGH (ref 100–199)
HDL: 43 mg/dL (ref >39)
LDL Chol Calc (NIH): 176 mg/dL — ABNORMAL HIGH (ref 0–99)
Triglycerides: 174 mg/dL — ABNORMAL HIGH (ref 0–149)
VLDL Cholesterol Cal: 32 mg/dL (ref 5–40)

## 2021-02-23 LAB — VITAMIN D 25 HYDROXY (VIT D DEFICIENCY, FRACTURES): Vit D, 25-Hydroxy: 14.1 ng/mL — ABNORMAL LOW (ref 30.0–100.0)

## 2021-02-23 LAB — ANA W/REFLEX IF POSITIVE: Anti Nuclear Antibody (ANA): NEGATIVE

## 2021-02-23 LAB — HEPATITIS C ANTIBODY: Hep C Virus Ab: <0.1 s/co ratio (ref 0.0–0.9)

## 2021-02-24 ENCOUNTER — Other Ambulatory Visit: Payer: Self-pay | Admitting: Internal Medicine

## 2021-02-24 MED ORDER — VITAMIN D (ERGOCALCIFEROL) 1.25 MG (50000 UNIT) PO CAPS
50000.0000 [IU] | ORAL_CAPSULE | ORAL | 1 refills | Status: DC
  Filled 2021-02-24: qty 12, 84d supply, fill #0

## 2021-02-24 MED ORDER — FERROUS SULFATE 325 (65 FE) MG PO TABS
325.0000 mg | ORAL_TABLET | Freq: Every day | ORAL | 1 refills | Status: DC
Start: 2021-02-24 — End: 2023-08-11
  Filled 2021-02-24 – 2021-02-27 (×2): qty 30, 30d supply, fill #0

## 2021-02-27 ENCOUNTER — Other Ambulatory Visit: Payer: Self-pay

## 2021-02-28 ENCOUNTER — Telehealth: Payer: Self-pay

## 2021-02-28 NOTE — Telephone Encounter (Signed)
[employer] [place] Id# [id number]  contacted pt to go over lab results  pt is aware and doesn't have any questions or concerns  

## 2021-03-01 ENCOUNTER — Other Ambulatory Visit: Payer: Self-pay

## 2021-08-01 ENCOUNTER — Ambulatory Visit: Payer: Self-pay

## 2021-08-02 ENCOUNTER — Ambulatory Visit: Payer: Self-pay

## 2021-08-30 ENCOUNTER — Telehealth: Payer: Self-pay | Admitting: Internal Medicine

## 2021-08-30 NOTE — Telephone Encounter (Signed)
I return Pt call, schedule a financial appt 09/13/21

## 2021-08-30 NOTE — Telephone Encounter (Signed)
Copied from CRM [id number]. Topic: General - Other >> Aug 30, 2021 10:56 AM Marylen Ponto wrote: Reason for CRM: Pt request that [facility] call her back regarding appt for orange card. Cb# [phone]

## 2021-09-13 ENCOUNTER — Ambulatory Visit: Payer: Self-pay

## 2021-09-27 ENCOUNTER — Other Ambulatory Visit: Payer: Self-pay

## 2021-09-27 ENCOUNTER — Ambulatory Visit: Payer: Self-pay | Attending: Internal Medicine | Admitting: Internal Medicine

## 2021-09-27 ENCOUNTER — Encounter: Payer: Self-pay | Admitting: Internal Medicine

## 2021-09-27 ENCOUNTER — Ambulatory Visit: Payer: Self-pay

## 2021-09-27 VITALS — BP 103/69 | HR 77 | Ht 60.5 in | Wt 158.2 lb

## 2021-09-27 DIAGNOSIS — R21 Rash and other nonspecific skin eruption: Secondary | ICD-10-CM

## 2021-09-27 DIAGNOSIS — Z23 Encounter for immunization: Secondary | ICD-10-CM

## 2021-09-27 NOTE — Progress Notes (Signed)
Patient ID: Cheryl Espinoza, female    DOB: 1975-12-11  MRN: [id number]  CC: face rash   Subjective: Cheryl Espinoza is a 45 y.o. female who presents for facial rash Her concerns today include:   C/o rash on face over cheeks. Seen for the same in June of last year.  At that time she reported that the rash was on the cheeks and is red.  Gets worse when she sweats or gets hot.  She thinks she is allergic to the surgical mask because redness increases when she wears it.  She wears it now only when she comes to doctor's visits.  She is tried wearing cloth masks but still when she sweats, she gets the redness.  I had tried her with MetroGel for possible rosacea.  She states that it takes away the redness a little but not the rash.  ANA was negative.  Patient Active Problem List   Diagnosis Date Noted   Overweight (BMI 25.0-29.9) 02/22/2021   Screening breast examination 05/11/2019   LGSIL on Pap smear of cervix 05/11/2019   Breast lump on right side at 11 o'clock position 05/11/2019   Breast lump on left side at 9 o'clock position 05/11/2019   Low grade squamous intraepith lesion on cytologic smear cervix (lgsil) 09/03/2017   Family history of diabetes mellitus in father 02/25/2017   Anemia 11/28/2016   Pilonidal cyst without abscess 05/03/2016   Vitamin D deficiency 03/16/2015   Allergic rhinitis 03/15/2015   Tinea pedis 03/15/2015   Thickened endometrium 09/22/2014   Dysmenorrhea 07/14/2014   Tension headache 07/14/2014   GERD (gastroesophageal reflux disease) 07/14/2014     Current Outpatient Medications on File Prior to Visit  Medication Sig Dispense Refill   ferrous sulfate 325 (65 FE) MG tablet Take 1 tablet (325 mg total) by mouth daily with breakfast. 100 tablet 1   ibuprofen (ADVIL,MOTRIN) 600 MG tablet Take 1 tablet (600 mg total) by mouth every 8 (eight) hours as needed (for chest wall pain; take after eating). 30 tablet 0   metroNIDAZOLE (METROGEL) 1  % gel Apply topically daily. Apply to affected area once a day for 1 week then PRN 60 g 0   Vitamin D, Ergocalciferol, (DRISDOL) 1.25 MG (50000 UNIT) CAPS capsule Take 1 capsule (50,000 Units total) by mouth every 7 (seven) days. 12 capsule 1   cetirizine (ZYRTEC) 10 MG tablet TAKE 1 TABLET (10 MG TOTAL) BY MOUTH DAILY. 30 tablet 1   fluticasone (FLONASE) 50 MCG/ACT nasal spray PLACE 2 SPRAYS INTO BOTH NOSTRILS DAILY. 16 g 0   SUMAtriptan (IMITREX) 50 MG tablet TAKE 1 TABLET BY MOUTH AT THE START OF THE HEADACHE. MAY REPEAT IN 2 HOURS 1 TIMES IF HEADACHE PERSISTS. MAX OF 2 TABS/24 HOURS 10 tablet 2   No current facility-administered medications on file prior to visit.    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    62 yo daughter.    Two younger children live in [place]    Lived in [place] since 2004.    Social Determinants of Health   Financial Resource Strain: Not on file  Food Insecurity: Not on file  Transportation Needs: Not on file  Physical Activity: Not on file  Stress: Not on file  Social Connections: Not on file  Intimate Partner Violence: Not on file    Family History  Problem Relation Age of Onset   Hyperlipidemia Mother    Diabetes Father    Diabetes Sister    Cancer Neg Hx    Heart disease Neg Hx     Past Surgical History:  Procedure Laterality Date   CESAREAN SECTION  2000, 2002      ROS: Review of Systems Negative except as stated above  PHYSICAL EXAM: BP 103/69    Pulse 77    Ht 5' 0.5" (1.537 m)    Wt 158 lb 4 oz (71.8 kg)    LMP 09/17/2021    SpO2 99%    BMI 30.40 kg/m   Physical Exam  General appearance - alert, well appearing, and in no distress Mental status - normal mood, behavior, speech, dress, motor activity, and thought processes Skin -fine papular slightly erythematous rash over the cheeks and eyebrows.   CMP Latest Ref Rng & Units 02/22/2021 06/08/2020 05/18/2020  Glucose 65 - 99 mg/dL 92 99 96  BUN 6 - 24 mg/dL 7 9 11   Creatinine 0.57 - 1.00 mg/dL 5.40 9.81)  Sodium 134 - 144 mmol/L 142 136 136  Potassium 3.5 - 5.2 mmol/L 4.5 4.0 4.2  Chloride 96 - 106 mmol/L 101 105 103  CO2 20 - 29 mmol/L 22 20(L) -  Calcium 8.7 - 10.2 mg/dL 9.9 9.2 9.1  Total Protein 6.0 - 8.5 g/dL 7.8 8.0 7.9  Total Bilirubin 0.0 - 1.2 mg/dL 0.4 0.3 1.91(Y  Alkaline Phos 44 - 121 IU/L 76 68 89  AST 0 - 40 IU/L 27 35 18  ALT 0 - 32 IU/L 29 28 -   Lipid Panel     Component Value Date/Time   CHOL 251 (H) 02/22/2021 1057   TRIG 174 (H) 02/22/2021 1057   HDL 43 02/22/2021 1057   CHOLHDL 5.8 (H) 02/22/2021 1057   LDLCALC 176 (H) 02/22/2021 1057    CBC    Component Value Date/Time   WBC 6.9 02/22/2021 1057   WBC 9.6 06/08/2020 1646   RBC 3.93 02/22/2021 1057   RBC 3.86 (L) 06/08/2020 1646   HGB 10.9 (L) 02/22/2021 1057   HCT 35.2 02/22/2021 1057   PLT 373 02/22/2021 1057   MCV 90 02/22/2021 1057   MCH 27.7 02/22/2021 1057   MCH 26.2 06/08/2020 1646   MCHC 31.0 (L) 02/22/2021 1057   MCHC 29.6 (L) 06/08/2020 1646   RDW 14.8 02/22/2021 1057   LYMPHSABS 2.9 06/08/2020 1646   LYMPHSABS 2.9 05/18/2020 1504   MONOABS 0.6 06/08/2020 1646   EOSABS 0.1 06/08/2020 1646   EOSABS 0.2 05/18/2020 1504   BASOSABS 0.0 06/08/2020 1646   BASOSABS 0.0 05/18/2020 1504    ASSESSMENT AND PLAN:  1. Facial rash I think she has an allergic component to the surgical  mask.  However she is not wearing the mask all the time and the rash persists.  I will  refer her to dermatology for further evaluation - Ambulatory referral to Dermatology  2. Need for influenza vaccination - Flu Vaccine QUAD 4080mo+IM (Fluarix, Fluzone & Alfiuria Quad PF)    [employer] Language interpreter used during this encounter. #[id number], Catha Gosselineredsa  Patient was given the opportunity to ask questions.  Patient verbalized understanding of the plan and was able to repeat key elements of the plan.   Orders Placed This Encounter  Procedures   Flu Vaccine QUAD 5980mo+IM (Fluarix, Fluzone & Alfiuria Quad PF)   Ambulatory referral to Dermatology     Requested Prescriptions    No prescriptions requested or ordered in this encounter    Return if symptoms worsen or fail to improve.  Jonah Blueeborah Madyn Ivins, MD, FACP

## 2021-11-13 ENCOUNTER — Other Ambulatory Visit: Payer: Self-pay

## 2021-11-13 MED ORDER — AZELAIC ACID 15 % EX GEL
CUTANEOUS | 0 refills | Status: DC
  Filled 2021-11-13: qty 50, 30d supply, fill #0

## 2021-11-14 ENCOUNTER — Other Ambulatory Visit: Payer: Self-pay

## 2021-11-15 ENCOUNTER — Other Ambulatory Visit: Payer: Self-pay

## 2021-11-19 ENCOUNTER — Other Ambulatory Visit: Payer: Self-pay

## 2021-12-05 ENCOUNTER — Other Ambulatory Visit: Payer: Self-pay

## 2021-12-05 MED ORDER — AZELAIC ACID 15 % EX GEL
CUTANEOUS | 0 refills | Status: DC
  Filled 2021-12-05: qty 50, 30d supply, fill #0

## 2021-12-06 ENCOUNTER — Other Ambulatory Visit: Payer: Self-pay

## 2021-12-10 ENCOUNTER — Ambulatory Visit (INDEPENDENT_AMBULATORY_CARE_PROVIDER_SITE_OTHER): Payer: Self-pay | Admitting: Physician Assistant

## 2021-12-10 ENCOUNTER — Encounter: Payer: Self-pay | Admitting: Physician Assistant

## 2021-12-10 ENCOUNTER — Other Ambulatory Visit: Payer: Self-pay

## 2021-12-10 DIAGNOSIS — K219 Gastro-esophageal reflux disease without esophagitis: Secondary | ICD-10-CM

## 2021-12-10 DIAGNOSIS — R1011 Right upper quadrant pain: Secondary | ICD-10-CM

## 2021-12-10 DIAGNOSIS — A048 Other specified bacterial intestinal infections: Secondary | ICD-10-CM

## 2021-12-10 DIAGNOSIS — Z6831 Body mass index (BMI) 31.0-31.9, adult: Secondary | ICD-10-CM

## 2021-12-10 DIAGNOSIS — E6609 Other obesity due to excess calories: Secondary | ICD-10-CM

## 2021-12-10 MED ORDER — PANTOPRAZOLE SODIUM 40 MG PO TBEC
40.0000 mg | DELAYED_RELEASE_TABLET | Freq: Every day | ORAL | 1 refills | Status: DC
  Filled 2021-12-10: qty 30, 30d supply, fill #0

## 2021-12-10 NOTE — Patient Instructions (Signed)
You will start taking Protonix once daily.  We will call you with your lab results and your ultrasound results when they are available. ? ?I hope that you feel better soon. ? ?Roney Jaffe, PA-C ?Physician Assistant ?[facility] ?[email or web address] ? ? ?Dolor abdominal en los adultos ?Abdominal Pain, Adult ?El dolor en el abdomen (dolor abdominal) puede tener muchas causas. A menudo, el dolor abdominal no es grave y [place] sin tratamiento o con tratamiento en la casa. Sin embargo, a Facilities manager abdominal es grave. ?El m?dico le har? preguntas sobre sus antecedentes m?dicos y le har? un examen f?sico para tratar de determinar la causa del dolor abdominal. ?Siga estas instrucciones en su casa: ? ?Medicamentos ?Use los medicamentos de venta libre y los recetados solamente como se lo haya indicado el m?dico. ?No tome un laxante a menos que se lo haya indicado el m?dico. ?Instrucciones generales ?Controle su afecci?n para detectar cualquier cambio. ?Beba suficiente l?quido como para mantener la orina de color amarillo p?lido. ?Concurra a todas las visitas de seguimiento como se lo haya indicado el m?dico. Esto es importante. ?Comun?quese con un m?dico si: ?El dolor abdominal cambia o empeora. ?No tiene apetito o baja de peso sin propon?rselo. ?Est? estre?ido o tiene diarrea durante m?s de 2 o 3 d?as. ?Tiene dolor cuando orina o defeca. ?El dolor abdominal lo despierta de noche. ?El dolor empeora con las comidas, despu?s de comer o con determinados alimentos. ?Tiene v?mitos y no puede retener nada de lo que ingiere. ?Tiene fiebre. ?Observa sangre en la orina. ?Solicite ayuda inmediatamente si: ?El dolor no desaparece tan pronto como el m?dico le dijo que era esperable. ?No puede dejar de vomitar. ?El dolor se siente solo en zonas del abdomen, como el lado derecho o la parte inferior izquierda del abdomen. Si se localiza en la zona derecha, posiblemente podr?a  tratarse de apendicitis. ?Las heces son sanguinolentas o de color negro, o de aspecto alquitranado. ?Tiene dolor intenso, c?licos o distensi?n abdominal. ?Tiene signos de deshidrataci?n, como los siguientes: ?Orina de color oscuro, muy escasa o falta de orina. ?Labios agrietados. ?Sequedad de boca. ?Ojos hundidos. ?Somnolencia. ?Debilidad. ?Tiene dificultad para respirar o Journalist, newspaper. ?Resumen ?A menudo, el dolor abdominal no es grave y [place] sin tratamiento o con tratamiento en la casa. Sin embargo, a Facilities manager abdominal es grave. ?Controle su afecci?n para detectar cualquier cambio. ?Use los medicamentos de venta libre y los recetados solamente como se lo haya indicado el m?dico. ?Comun?quese con un m?dico si el dolor abdominal cambia o empeora. ?Busque ayuda de inmediato si tiene dolor intenso, c?licos o distensi?n abdominal. ?Esta informaci?n no tiene como fin reemplazar el consejo del m?dico. Aseg?rese de hacerle al m?dico cualquier pregunta que tenga. ?Document Revised: 02/17/2019 Document Reviewed: 02/17/2019 ?Elsevier Patient Education ? 2023 [employer] Inc[employer] ? ?

## 2021-12-10 NOTE — Progress Notes (Signed)
Patient has eaten and taken medication today. ?Patient reports epigastric and lower abdominal pain beginning Sunday and increasing with sitting and eating ?Patient reports pain being lessened today. ? ?

## 2021-12-10 NOTE — Progress Notes (Signed)
? ?Established Patient Office Visit ? ?Subjective:  ?Patient ID: Cheryl Espinoza, female    DOB: Jun 07, 1976  Age: 46 y.o. MRN: [id number] ? ?CC:  ?Chief Complaint  ?Patient presents with  ? Bloated  ? ? ?HPI ?Cheryl Espinoza presents for abdominal pain and bloating for the past week.  States that she feels more bloated and more pain after eating.  States that the pain occurs in the upper right quadrant, and will radiate towards the left.  States bowel movements have been normal, denies nausea or vomiting.  Has not tried anything for relief. Denies NSAID use. ? ?Due to language barrier, an interpreter was present during the history-taking and subsequent discussion (and for part of the physical exam) with this patient. ? ? ? ?Past Medical History:  ?Diagnosis Date  ? GERD (gastroesophageal reflux disease)   ? History of abnormal cervical Pap smear   ? History of vitamin D deficiency   ? Tension headache   ? ? ?Past Surgical History:  ?Procedure Laterality Date  ? CESAREAN SECTION  2000, 2002   ? ? ?Family History  ?Problem Relation Age of Onset  ? Hyperlipidemia Mother   ? Diabetes Father   ? Diabetes Sister   ? Cancer Neg Hx   ? Heart disease Neg Hx   ? ? ?Social History  ? ?Socioeconomic History  ? Marital status: Single  ?  Spouse name: Not on file  ? Number of children: 3   ? Years of education: 33   ? Highest education level: 9th grade  ?Occupational History  ? Occupation: [employer]   ?Tobacco Use  ? Smoking status: Never  ? Smokeless tobacco: Never  ?Vaping Use  ? Vaping Use: Never used  ?Substance and Sexual Activity  ? Alcohol use: No  ?  Alcohol/week: 0.0 standard drinks  ? Drug use: No  ? Sexual activity: Yes  ?  Birth control/protection: Pill  ?Other Topics Concern  ? Not on file  ?Social History Narrative  ? Lives at home with daughter.   ? 61 yo daughter.   ? Two younger children live in [place].   ? Lived in [place] since 2004.   ? ?Social Determinants of Health   ? ?Financial Resource Strain: Not on file  ?Food Insecurity: Not on file  ?Transportation Needs: Not on file  ?Physical Activity: Not on file  ?Stress: Not on file  ?Social Connections: Not on file  ?Intimate Partner Violence: Not on file  ? ? ?Outpatient Medications Prior to Visit  ?Medication Sig Dispense Refill  ? Azelaic Acid (FINACEA) 15 % gel Apply 1 a small amount to affected area twice a day Apply to face twice a day, after washing, before moisturizer. 50 g 0  ? Azelaic Acid (FINACEA) 15 % gel Apply 1 a small amount to affected area twice a day Apply to face twice a day, after washing, before moisturizer. 50 g 0  ? cetirizine (ZYRTEC) 10 MG tablet TAKE 1 TABLET (10 MG TOTAL) BY MOUTH DAILY. 30 tablet 1  ? ferrous sulfate 325 (65 FE) MG tablet Take 1 tablet (325 mg total) by mouth daily with breakfast. 100 tablet 1  ? fluticasone (FLONASE) 50 MCG/ACT nasal spray PLACE 2 SPRAYS INTO BOTH NOSTRILS DAILY. 16 g 0  ? ibuprofen (ADVIL,MOTRIN) 600 MG tablet Take 1 tablet (600 mg total) by mouth every 8 (eight) hours as needed (for chest wall pain; take after eating). 30 tablet 0  ? metroNIDAZOLE (  METROGEL) 1 % gel Apply topically daily. Apply to affected area once a day for 1 week then PRN 60 g 0  ? SUMAtriptan (IMITREX) 50 MG tablet TAKE 1 TABLET BY MOUTH AT THE START OF THE HEADACHE. MAY REPEAT IN 2 HOURS 1 TIMES IF HEADACHE PERSISTS. MAX OF 2 TABS/24 HOURS 10 tablet 2  ? Vitamin D, Ergocalciferol, (DRISDOL) 1.25 MG (50000 UNIT) CAPS capsule Take 1 capsule (50,000 Units total) by mouth every 7 (seven) days. 12 capsule 1  ? ?No facility-administered medications prior to visit.  ? ? ?No Known Allergies ? ?ROS ?Review of Systems  ?Constitutional: Negative.   ?HENT: Negative.    ?Eyes: Negative.   ?Respiratory:  Negative for shortness of breath.   ?Cardiovascular:  Negative for chest pain.  ?Gastrointestinal:  Positive for abdominal distention and abdominal pain. Negative for constipation, diarrhea, nausea and  vomiting.  ?Endocrine: Negative.   ?Genitourinary:  Negative for dysuria.  ?Musculoskeletal:  Negative for back pain.  ?Skin: Negative.   ?Allergic/Immunologic: Negative.   ?Neurological: Negative.   ?Hematological: Negative.   ?Psychiatric/Behavioral: Negative.    ? ?  ?Objective:  ?  ?Physical Exam ?Vitals and nursing note reviewed.  ?Constitutional:   ?   Appearance: Normal appearance. She is obese.  ?HENT:  ?   Head: Normocephalic and atraumatic.  ?   Right Ear: External ear normal.  ?   Left Ear: External ear normal.  ?   Nose: Nose normal.  ?   Mouth/Throat:  ?   Mouth: Mucous membranes are moist.  ?   Pharynx: Oropharynx is clear.  ?Eyes:  ?   Extraocular Movements: Extraocular movements intact.  ?   Conjunctiva/sclera: Conjunctivae normal.  ?   Pupils: Pupils are equal, round, and reactive to light.  ?Cardiovascular:  ?   Rate and Rhythm: Normal rate and regular rhythm.  ?   Pulses: Normal pulses.  ?   Heart sounds: Normal heart sounds.  ?Pulmonary:  ?   Effort: Pulmonary effort is normal.  ?   Breath sounds: Normal breath sounds.  ?Abdominal:  ?   General: Bowel sounds are normal. There is distension.  ?   Palpations: Abdomen is soft.  ?   Tenderness: There is generalized abdominal tenderness and tenderness in the right upper quadrant, right lower quadrant and suprapubic area. There is no guarding. Positive signs include Murphy's sign.  ?Musculoskeletal:     ?   General: Normal range of motion.  ?   Cervical back: Normal range of motion and neck supple.  ?Skin: ?   General: Skin is warm and dry.  ?Neurological:  ?   General: No focal deficit present.  ?   Mental Status: She is alert and oriented to person, place, and time.  ?Psychiatric:     ?   Mood and Affect: Mood normal.     ?   Behavior: Behavior normal.     ?   Thought Content: Thought content normal.  ? ? ?BP 129/84 (BP Location: Left Arm, Patient Position: Sitting, Cuff Size: Normal)   Pulse 84   Temp 98.9 ?F (37.2 ?C) (Oral)   Resp 18   Ht 5'  (1.524 m)   Wt 162 lb (73.5 kg)   LMP 11/12/2021   SpO2 98%   BMI 31.64 kg/m?  ?Wt Readings from Last 3 Encounters:  ?12/10/21 162 lb (73.5 kg)  ?09/27/21 158 lb 4 oz (71.8 kg)  ?02/22/21 154 lb 3.2 oz (69.9 kg)  ? ? ? ?  Health Maintenance Due  ?Topic Date Due  ? COVID-19 Vaccine (1) Never done  ? ? ?There are no preventive care reminders to display for this patient. ? ?Lab Results  ?Component Value Date  ? TSH 1.510 05/18/2020  ? ?Lab Results  ?Component Value Date  ? WBC 6.9 02/22/2021  ? HGB 10.9 (L) 02/22/2021  ? HCT 35.2 02/22/2021  ? MCV 90 02/22/2021  ? PLT 373 02/22/2021  ? ?Lab Results  ?Component Value Date  ? NA 142 02/22/2021  ? K 4.5 02/22/2021  ? CO2 22 02/22/2021  ? GLUCOSE 92 02/22/2021  ? BUN 7 02/22/2021  ? CREATININE 0.68 02/22/2021  ? BILITOT 0.4 02/22/2021  ? ALKPHOS 76 02/22/2021  ? AST 27 02/22/2021  ? ALT 29 02/22/2021  ? PROT 7.8 02/22/2021  ? ALBUMIN 4.5 02/22/2021  ? CALCIUM 9.9 02/22/2021  ? ANIONGAP 11 06/08/2020  ? EGFR 111 02/22/2021  ? ?Lab Results  ?Component Value Date  ? CHOL 251 (H) 02/22/2021  ? ?Lab Results  ?Component Value Date  ? HDL 43 02/22/2021  ? ?Lab Results  ?Component Value Date  ? LDLCALC 176 (H) 02/22/2021  ? ?Lab Results  ?Component Value Date  ? TRIG 174 (H) 02/22/2021  ? ?Lab Results  ?Component Value Date  ? CHOLHDL 5.8 (H) 02/22/2021  ? ?Lab Results  ?Component Value Date  ? HGBA1C 5.9 (H) 02/22/2021  ? ? ?  ?Assessment & Plan:  ? ?Problem List Items Addressed This Visit   ? ?  ? Digestive  ? GERD (gastroesophageal reflux disease)  ? Relevant Medications  ? pantoprazole (PROTONIX) 40 MG tablet  ? Other Relevant Orders  ? H Pylori, IGM, IGG, IGA AB  ? ?Other Visit Diagnoses   ? ? RUQ pain    -  Primary  ? Relevant Orders  ? US ABDOMEN LIMITED RUQ (LIVER/GB)  ? Comp. Metabolic Panel (12)  ? Class 1 obesity due to excess calories without serious comorbidity with body mass index (BMI) of 31.0 to 31.9 in adult      ? ?  ? ? ?Meds ordered this encounter   ?Medications  ? pantoprazole (PROTONIX) 40 MG tablet  ?  Sig: Take 1 tablet (40 mg total) by mouth daily.  ?  Dispense:  30 tablet  ?  Refill:  1  ?  Order Specific Question:   Supervising Provider  ?  Answer:   Elsie Stain [[id number]]

## 2021-12-12 LAB — COMP. METABOLIC PANEL (12)
AST: 24 IU/L (ref 0–40)
Albumin/Globulin Ratio: 1.3 (ref 1.2–2.2)
Albumin: 4.5 g/dL (ref 3.8–4.8)
Alkaline Phosphatase: 71 IU/L (ref 44–121)
BUN/Creatinine Ratio: 21 (ref 9–23)
BUN: 11 mg/dL (ref 6–24)
Bilirubin Total: 0.3 mg/dL (ref 0.0–1.2)
Calcium: 9.3 mg/dL (ref 8.7–10.2)
Chloride: 105 mmol/L (ref 96–106)
Creatinine, Ser: 0.53 mg/dL — ABNORMAL LOW (ref 0.57–1.00)
Globulin, Total: 3.6 g/dL (ref 1.5–4.5)
Glucose: 92 mg/dL (ref 70–99)
Potassium: 4.5 mmol/L (ref 3.5–5.2)
Sodium: 139 mmol/L (ref 134–144)
Total Protein: 8.1 g/dL (ref 6.0–8.5)
eGFR: 118 mL/min/{1.73_m2} (ref >59)

## 2021-12-12 LAB — H PYLORI, IGM, IGG, IGA AB
H pylori, IgM Abs: 9.9 units — ABNORMAL HIGH (ref 0.0–8.9)
H. pylori, IgA Abs: >35 units — ABNORMAL HIGH (ref 0.0–8.9)
H. pylori, IgG AbS: >9.4 Index Value — ABNORMAL HIGH (ref 0.00–0.79)

## 2021-12-13 ENCOUNTER — Other Ambulatory Visit: Payer: Self-pay

## 2021-12-13 MED ORDER — AMOXICILLIN 500 MG PO CAPS
1000.0000 mg | ORAL_CAPSULE | Freq: Two times a day (BID) | ORAL | 0 refills | Status: AC
  Filled 2021-12-13: qty 56, 14d supply, fill #0

## 2021-12-13 MED ORDER — CLARITHROMYCIN 500 MG PO TABS
500.0000 mg | ORAL_TABLET | Freq: Two times a day (BID) | ORAL | 0 refills | Status: AC
  Filled 2021-12-13: qty 28, 14d supply, fill #0

## 2021-12-13 NOTE — Addendum Note (Signed)
Addended by: Roney Jaffe on: 12/13/2021 08:13 AM ? ? Modules accepted: Orders ? ?

## 2021-12-14 ENCOUNTER — Telehealth: Payer: Self-pay | Admitting: *Deleted

## 2021-12-14 NOTE — Telephone Encounter (Signed)
Medical Assistant used [employer] to contact patient.  ?Interpreter Name: Erie Noe Interpreter #: [id number] ?Patient verified DOB ?Patient will take 2 antibiotics and protonix for the next 2 weeks to address positive h pylori. Patient will follow up in 2 weeks after completing the medication. ?

## 2021-12-14 NOTE — Telephone Encounter (Signed)
-----   Message from Roney Jaffe, PA-C sent at 12/13/2021  8:13 AM EDT ----- ?Please call patient and let her know that her kidney function and liver function are within normal limits.  She did test positive for H. pylori.  She needs to take clarithromycin and amoxicillin both for 14 days.  She will continue taking the Protonix.  Please schedule a follow-up appointment for 2 weeks.  Prescription sent to her pharmacy. ?

## 2021-12-17 ENCOUNTER — Other Ambulatory Visit: Payer: Self-pay

## 2022-01-14 ENCOUNTER — Other Ambulatory Visit: Payer: Self-pay

## 2022-01-14 MED ORDER — DOXYCYCLINE HYCLATE 100 MG PO TABS
ORAL_TABLET | ORAL | 1 refills | Status: DC
  Filled 2022-01-14: qty 31, 31d supply, fill #0

## 2022-02-05 ENCOUNTER — Ambulatory Visit: Payer: Self-pay | Admitting: *Deleted

## 2022-02-05 NOTE — Telephone Encounter (Signed)
Summary: pain on right hip leg near buttocks.   Pt stated experiencing pain on right hip leg near buttocks.      Pt stated pain gets worse at night.    Pt stated pain has not been severe.   Pt seeking clinical advice.      Chief Complaint: hip pain Symptoms: hurts at top of leg and travels to buttocks Frequency: comes and  goes Pertinent Negatives: Patient denies fever Disposition: [] ED /[] Urgent Care (no appt availability in office) / [] Appointment(In office/virtual)/ []  [facility]/ [] Home Care/ [] Refused Recommended Disposition /[x] [facility]/ []  Follow-up with PCP Additional Notes: No appt in office, pt has to work tomorrow but will go to Thursday.  Reason for Disposition  [1] MODERATE pain (e.g., interferes with normal activities, limping) AND [2] present > 3 days  Answer Assessment - Initial Assessment Questions 1. LOCATION and RADIATION: "Where is the pain located?"      Right upper hip near buttocks 2. QUALITY: "What does the pain feel like?"  (e.g., sharp, dull, aching, burning)     Not sharp, starts in leg and travels to buttocks, comes and goes for a few days at a time and comes back for a few days 3. SEVERITY: "How bad is the pain?" "What does it keep you from doing?"   (Scale 1-10; or mild, moderate, severe)   -  MILD (1-3): doesn't interfere with normal activities    -  MODERATE (4-7): interferes with normal activities (e.g., work or school) or awakens from sleep, limping    -  SEVERE (8-10): excruciating pain, unable to do any normal activities, unable to walk     moderate 4. ONSET: "When did the pain start?" "Does it come and go, or is it there all the time?"     Goes away with pain medication but comes back if does not take. Takes Naprosyn 5. WORK OR EXERCISE: "Has there been any recent work or exercise that involved this part of the body?"      no 6. CAUSE: "What do you think is causing the hip pain?"      unknown 7.  AGGRAVATING FACTORS: "What makes the hip pain worse?" (e.g., walking, climbing stairs, running)     Hurts when lays down 8. OTHER SYMPTOMS: "Do you have any other symptoms?" (e.g., back pain, pain shooting down leg,  fever, rash)     Wakes up with bone pain, no fever that she knows of, pain starts in leg and travels up, not down.  Protocols used: Hip Pain-A-AH

## 2022-04-11 ENCOUNTER — Ambulatory Visit: Payer: Self-pay | Admitting: Physician Assistant

## 2022-05-02 ENCOUNTER — Ambulatory Visit: Payer: Self-pay | Admitting: Physician Assistant

## 2022-05-09 ENCOUNTER — Encounter: Payer: Self-pay | Admitting: Physician Assistant

## 2022-05-09 ENCOUNTER — Other Ambulatory Visit: Payer: Self-pay

## 2022-05-09 ENCOUNTER — Ambulatory Visit: Payer: Self-pay | Attending: Physician Assistant | Admitting: Physician Assistant

## 2022-05-09 VITALS — BP 117/81 | HR 84 | Ht 61.0 in | Wt 165.2 lb

## 2022-05-09 DIAGNOSIS — R42 Dizziness and giddiness: Secondary | ICD-10-CM

## 2022-05-09 DIAGNOSIS — Z833 Family history of diabetes mellitus: Secondary | ICD-10-CM

## 2022-05-09 DIAGNOSIS — Z789 Other specified health status: Secondary | ICD-10-CM

## 2022-05-09 DIAGNOSIS — G44219 Episodic tension-type headache, not intractable: Secondary | ICD-10-CM

## 2022-05-09 DIAGNOSIS — D649 Anemia, unspecified: Secondary | ICD-10-CM

## 2022-05-09 DIAGNOSIS — H6983 Other specified disorders of Eustachian tube, bilateral: Secondary | ICD-10-CM

## 2022-05-09 DIAGNOSIS — J302 Other seasonal allergic rhinitis: Secondary | ICD-10-CM

## 2022-05-09 MED ORDER — PROMETHAZINE HCL 12.5 MG PO TABS
12.5000 mg | ORAL_TABLET | Freq: Three times a day (TID) | ORAL | 1 refills | Status: DC | PRN
  Filled 2022-05-09: qty 30, 10d supply, fill #0

## 2022-05-09 MED ORDER — FLUTICASONE PROPIONATE 50 MCG/ACT NA SUSP
2.0000 | Freq: Every day | NASAL | 2 refills | Status: DC
  Filled 2022-05-09: qty 16, 30d supply, fill #0

## 2022-05-09 MED ORDER — CETIRIZINE HCL 10 MG PO TABS
10.0000 mg | ORAL_TABLET | Freq: Every day | ORAL | 1 refills | Status: DC
  Filled 2022-05-09: qty 30, 30d supply, fill #0

## 2022-05-09 NOTE — Progress Notes (Signed)
Patient ID: Cheryl Espinoza, female   DOB: 17-Jul-1976, 46 y.o.   MRN: [id number]   Cheryl Espinoza, is a 46 y.o. female  ERX:[id number]  [id number]:[id number]  DOB - September 27, 1975  Chief Complaint  Patient presents with   Dizziness   Medication Refill       Subjective:   Cheryl Espinoza is a 46 y.o. female here today for intermittent dizziness and HA sometimes associated with nausea.  She has had this in the past.  Occurs less often now-about once monthly and relieved by excedrin(it's a [place] form(tylenol and caffiene).  No vision changes.  She does have sinus congestion.  No FH aneurysm.  She does have FH diabetes.    No problems updated.  ALLERGIES: No Known Allergies  PAST MEDICAL HISTORY: Past Medical History:  Diagnosis Date   GERD (gastroesophageal reflux disease)    History of abnormal cervical Pap smear    History of vitamin D deficiency    Tension headache     MEDICATIONS AT HOME: Prior to Admission medications   Medication Sig Start Date End Date Taking? Authorizing Provider  promethazine (PHENERGAN) 12.5 MG tablet Take 1 tablet (12.5 mg total) by mouth every 8 (eight) hours as needed for nausea or vomiting. Or dizziness with headache 05/09/22  Yes [name], PA-C  Azelaic Acid (FINACEA) 15 % gel Apply 1 a small amount to affected area twice a day Apply to face twice a day, after washing, before moisturizer. Patient not taking: Reported on 05/09/2022 11/13/21     Azelaic Acid (FINACEA) 15 % gel Apply 1 a small amount to affected area twice a day Apply to face twice a day, after washing, before moisturizer. Patient not taking: Reported on 05/09/2022 12/05/21     cetirizine (ZYRTEC) 10 MG tablet TAKE 1 TABLET (10 MG TOTAL) BY MOUTH DAILY. 05/09/22 05/09/23  [name], PA-C  doxycycline (VIBRA-TABS) 100 MG tablet Take 1 tablet by mouth once a day with food Patient not taking: Reported on 05/09/2022 01/14/22     ferrous sulfate 325 (65 FE) MG  tablet Take 1 tablet (325 mg total) by mouth daily with breakfast. Patient not taking: Reported on 05/09/2022 02/24/21   Marcine Matar, MD  fluticasone (FLONASE) 50 MCG/ACT nasal spray PLACE 2 SPRAYS INTO BOTH NOSTRILS DAILY. 05/09/22 05/09/23  [name], PA-C  ibuprofen (ADVIL,MOTRIN) 600 MG tablet Take 1 tablet (600 mg total) by mouth every 8 (eight) hours as needed (for chest wall pain; take after eating). Patient not taking: Reported on 05/09/2022 04/20/18   [name], Hewitt Shorts, MD  metroNIDAZOLE (METROGEL) 1 % gel Apply topically daily. Apply to affected area once a day for 1 week then PRN Patient not taking: Reported on 05/09/2022 02/22/21   Marcine Matar, MD  pantoprazole (PROTONIX) 40 MG tablet Take 1 tablet (40 mg total) by mouth daily. Patient not taking: Reported on 05/09/2022 12/10/21   Mayers, Cari S, PA-C  Vitamin D, Ergocalciferol, (DRISDOL) 1.25 MG (50000 UNIT) CAPS capsule Take 1 capsule (50,000 Units total) by mouth every 7 (seven) days. Patient not taking: Reported on 05/09/2022 02/24/21   Marcine Matar, MD    ROS: Neg resp Neg cardiac Neg GU Neg MS Neg psych   Objective:   Vitals:   05/09/22 1038  BP: 117/81  Pulse: 84  SpO2: 98%  Weight: 165 lb 3.2 oz (74.9 kg)  Height: 5\' 1"  (1.549 m)   Exam General appearance : Awake, alert, not in any  distress. Speech Clear. Not toxic looking HEENT: Atraumatic and Normocephalic, pupils equally reactive to light and accomodation, EOM intact without nystagmus.   Neck: Supple, no JVD. No cervical lymphadenopathy.  Chest: Good air entry bilaterally, CTAB.  No rales/rhonchi/wheezing CVS: S1 S2 regular, no murmurs.  Extremities: B/L Lower Ext shows no edema, both legs are warm to touch Neurology: Awake alert, and oriented X 3, CN II-XII intact, Non focal.  No asymmetry.   Skin: No Rash  Data Review Lab Results  Component Value Date   HGBA1C 5.9 (H) 02/22/2021   HGBA1C 5.5 01/13/2020   HGBA1C 5.7 11/26/2016     Assessment & Plan   1. Dizziness No red flags.  Increase water intake to 80-100 ounces water daily - Thyroid Panel With TSH - promethazine (PHENERGAN) 12.5 MG tablet; Take 1 tablet (12.5 mg total) by mouth every 8 (eight) hours as needed for nausea or vomiting. Or dizziness with headache  Dispense: 30 tablet; Refill: 1  2. Seasonal allergies - fluticasone (FLONASE) 50 MCG/ACT nasal spray; PLACE 2 SPRAYS INTO BOTH NOSTRILS DAILY.  Dispense: 16 g; Refill: 2 - cetirizine (ZYRTEC) 10 MG tablet; TAKE 1 TABLET (10 MG TOTAL) BY MOUTH DAILY.  Dispense: 30 tablet; Refill: 1  3. Dysfunction of both eustachian tubes - cetirizine (ZYRTEC) 10 MG tablet; TAKE 1 TABLET (10 MG TOTAL) BY MOUTH DAILY.  Dispense: 30 tablet; Refill: 1 - CBC with Differential/Platelet  4. Anemia, unspecified type Continue iron once daily - CBC with Differential/Platelet  5. Family history of diabetes mellitus I have had a lengthy discussion and provided education about insulin resistance and the intake of too much sugar/refined carbohydrates.  I have advised the patient to work at a goal of eliminating sugary drinks, candy, desserts, sweets, refined sugars, processed foods, and white carbohydrates.  The patient expresses understanding.   - Hemoglobin A1c  6. Episodic tension-type headache, not intractable Ok to use excedrin 2-3 times weekly - fluticasone (FLONASE) 50 MCG/ACT nasal spray; PLACE 2 SPRAYS INTO BOTH NOSTRILS DAILY.  Dispense: 16 g; Refill: 2 - cetirizine (ZYRTEC) 10 MG tablet; TAKE 1 TABLET (10 MG TOTAL) BY MOUTH DAILY.  Dispense: 30 tablet; Refill: 1  7. Language barrier [employer]" interpreters used and additional time performing visit was required.   Return in about 6 months (around 11/07/2022) for PCP(? Laural Benes) for chronic conditions).  The patient was given clear instructions to go to ER or return to medical center if symptoms don't improve, worsen or new problems develop. The patient verbalized  understanding. The patient was told to call to get lab results if they haven't heard anything in the next week.      Georgian Co, PA-C [facility] [place], [place] [phone]   05/09/2022, 10:55 AM

## 2022-05-10 LAB — THYROID PANEL WITH TSH
Free Thyroxine Index: 1.7 (ref 1.2–4.9)
T3 Uptake Ratio: 25 % (ref 24–39)
T4, Total: 6.7 ug/dL (ref 4.5–12.0)
TSH: 2.15 u[IU]/mL (ref 0.450–4.500)

## 2022-05-10 LAB — CBC WITH DIFFERENTIAL/PLATELET
Basophils Absolute: 0 10*3/uL (ref 0.0–0.2)
Basos: 1 %
EOS (ABSOLUTE): 0.1 10*3/uL (ref 0.0–0.4)
Eos: 1 %
Hematocrit: 33.8 % — ABNORMAL LOW (ref 34.0–46.6)
Hemoglobin: 11.1 g/dL (ref 11.1–15.9)
Immature Grans (Abs): 0 10*3/uL (ref 0.0–0.1)
Immature Granulocytes: 0 %
Lymphocytes Absolute: 2.8 10*3/uL (ref 0.7–3.1)
Lymphs: 43 %
MCH: 28.7 pg (ref 26.6–33.0)
MCHC: 32.8 g/dL (ref 31.5–35.7)
MCV: 87 fL (ref 79–97)
Monocytes Absolute: 0.4 10*3/uL (ref 0.1–0.9)
Monocytes: 6 %
Neutrophils Absolute: 3.2 10*3/uL (ref 1.4–7.0)
Neutrophils: 49 %
Platelets: 337 10*3/uL (ref 150–450)
RBC: 3.87 x10E6/uL (ref 3.77–5.28)
RDW: 16.4 % — ABNORMAL HIGH (ref 11.7–15.4)
WBC: 6.5 10*3/uL (ref 3.4–10.8)

## 2022-05-10 LAB — HEMOGLOBIN A1C
Est. average glucose Bld gHb Est-mCnc: 126 mg/dL
Hgb A1c MFr Bld: 6 % — ABNORMAL HIGH (ref 4.8–5.6)

## 2022-05-16 ENCOUNTER — Other Ambulatory Visit: Payer: Self-pay

## 2022-11-07 ENCOUNTER — Ambulatory Visit: Payer: Self-pay | Admitting: Internal Medicine

## 2022-12-05 ENCOUNTER — Ambulatory Visit: Payer: Self-pay | Attending: Internal Medicine

## 2023-01-09 ENCOUNTER — Other Ambulatory Visit: Payer: Self-pay

## 2023-01-09 ENCOUNTER — Ambulatory Visit: Payer: Self-pay | Attending: Internal Medicine | Admitting: Internal Medicine

## 2023-01-09 ENCOUNTER — Encounter: Payer: Self-pay | Admitting: Internal Medicine

## 2023-01-09 VITALS — BP 143/86 | HR 85 | Temp 98.0°F | Ht 61.0 in | Wt 161.8 lb

## 2023-01-09 DIAGNOSIS — G43009 Migraine without aura, not intractable, without status migrainosus: Secondary | ICD-10-CM

## 2023-01-09 MED ORDER — PROPRANOLOL HCL 10 MG PO TABS
10.0000 mg | ORAL_TABLET | Freq: Two times a day (BID) | ORAL | 1 refills | Status: DC
  Filled 2023-01-09: qty 60, 30d supply, fill #0

## 2023-01-09 NOTE — Progress Notes (Signed)
Headaches

## 2023-01-09 NOTE — Progress Notes (Signed)
Patient ID: Cheryl Espinoza, female    DOB: [birth year]  MRN: [id number]  CC: Headache   Subjective: Cheryl Espinoza is a 47 y.o. female who presents for eval of HA Her concerns today include:  Patient with history of prediabetes, anemia,   [employer] Language interpreter used during this encounter. #[id number], Cheryl Espinoza  Presents today with concerns for HA Going on for past 3 days but has stop.  Has remaining burning sensation on top of scalp Gets HA about 3x/wk; all over the head;  assoc with photophobia and phenophobia, nausea, dizziness -HA can last up to 3 days -t[facility] OTC med from [facility] that contains, ASA, Tylenol and caffine (showed me a picture of it on her phone).  Takes 2 pills when "head is strong"  takes 1/2 hr to kick in Diagnosed with migraines and tension headaches in the past.  Looks like she was tried with Imitrex in the past but did not tolerate it. Reports feeling tired and wanting to sleep more BP noted to be elev today.  On review of flow sheet, looks like blood pressure has been good most of the times.  Patient Active Problem List   Diagnosis Date Noted   Overweight (BMI 25.0-29.9) 02/22/2021   Screening breast examination 05/11/2019   LGSIL on Pap smear of cervix 05/11/2019   Breast lump on right side at 11 o'clock position 05/11/2019   Breast lump on left side at 9 o'clock position 05/11/2019   Low grade squamous intraepith lesion on cytologic smear cervix (lgsil) 09/03/2017   Family history of diabetes mellitus in father 02/25/2017   Anemia 11/28/2016   Pilonidal cyst without abscess 05/03/2016   Vitamin D deficiency 03/16/2015   Allergic rhinitis 03/15/2015   Tinea pedis 03/15/2015   Thickened endometrium 09/22/2014   Dysmenorrhea 07/14/2014   Tension headache 07/14/2014   GERD (gastroesophageal reflux disease) 07/14/2014     Current Outpatient Medications on File Prior to Visit  Medication Sig Dispense Refill   Azelaic Acid  (FINACEA) 15 % gel Apply 1 a small amount to affected area twice a day Apply to face twice a day, after washing, before moisturizer. (Patient not taking: Reported on 05/09/2022) 50 g 0   Azelaic Acid (FINACEA) 15 % gel Apply 1 a small amount to affected area twice a day Apply to face twice a day, after washing, before moisturizer. (Patient not taking: Reported on 05/09/2022) 50 g 0   cetirizine (ZYRTEC) 10 MG tablet Take 1 tablet (10 mg total) by mouth daily. (Patient not taking: Reported on 01/09/2023) 30 tablet 1   doxycycline (VIBRA-TABS) 100 MG tablet Take 1 tablet by mouth once a day with food (Patient not taking: Reported on 05/09/2022) 31 tablet 1   ferrous sulfate 325 (65 FE) MG tablet Take 1 tablet (325 mg total) by mouth daily with breakfast. (Patient not taking: Reported on 05/09/2022) 100 tablet 1   fluticasone (FLONASE) 50 MCG/ACT nasal spray PLACE 2 SPRAYS INTO BOTH NOSTRILS DAILY. (Patient not taking: Reported on 01/09/2023) 16 g 2   ibuprofen (ADVIL,MOTRIN) 600 MG tablet Take 1 tablet (600 mg total) by mouth every 8 (eight) hours as needed (for chest wall pain; take after eating). (Patient not taking: Reported on 05/09/2022) 30 tablet 0   metroNIDAZOLE (METROGEL) 1 % gel Apply topically daily. Apply to affected area once a day for 1 week then PRN (Patient not taking: Reported on 05/09/2022) 60 g 0   pantoprazole (PROTONIX) 40 MG tablet Take 1  tablet (40 mg total) by mouth daily. (Patient not taking: Reported on 05/09/2022) 30 tablet 1   promethazine (PHENERGAN) 12.5 MG tablet Take 1 tablet (12.5 mg total) by mouth every 8 (eight) hours as needed for nausea or vomiting. Or dizziness with headache (Patient not taking: Reported on 01/09/2023) 30 tablet 1   Vitamin D, Ergocalciferol, (DRISDOL) 1.25 MG (50000 UNIT) CAPS capsule Take 1 capsule (50,000 Units total) by mouth every 7 (seven) days. (Patient not taking: Reported on 05/09/2022) 12 capsule 1   No current facility-administered medications on file  prior to visit.    No Known Allergies  Social History   Socioeconomic History   Marital status: Single    Spouse name: Not on file   Number of children: 3    Years of education: 9    Highest education level: 9th grade  Occupational History   Occupation: Restaurant [employer]   Tobacco Use   Smoking status: Never   Smokeless tobacco: Never  Vaping Use   Vaping Use: Never used  Substance and Sexual Activity   Alcohol use: No    Alcohol/week: 0.0 standard drinks of alcohol   Drug use: No   Sexual activity: Yes    Birth control/protection: Pill  Other Topics Concern   Not on file  Social History Narrative   Lives at home with daughter.    63 yo daughter.    Two younger children [place] in [place]    Lived in [place] since 2004.    Social Determinants of Health   Financial Resource Strain: Not on file  Food Insecurity: Not on file  Transportation Needs: No Transportation Needs (08/31/2020)   PRAPARE - Administrator, Civil Service (Medical): No    Lack of Transportation (Non-Medical): No  Physical Activity: Insufficiently Active (11/11/2017)   Exercise Vital Sign    Days of Exercise per Week: 2 days    Minutes of Exercise per Session: 10 min  Stress: No Stress Concern Present (11/11/2017)   [employer] of Occupational Health - Occupational Stress Questionnaire    Feeling of Stress : Not at all  Social Connections: Moderately Isolated (11/11/2017)   Social Connection and Isolation Panel [NHANES]    Frequency of Communication with Friends and Family: More than three times a week    Frequency of Social Gatherings with Friends and Family: Once a week    Attends Religious Services: Never    Database administrator or Organizations: No    Attends Engineer, structural: Never    Marital Status: Never married  Catering manager Violence: Not on file    Family History  Problem Relation Age of Onset   Hyperlipidemia Mother    Diabetes Father    Diabetes  Sister    Cancer Neg Hx    Heart disease Neg Hx     Past Surgical History:  Procedure Laterality Date   CESAREAN SECTION  2000, 2002     ROS: Review of Systems Negative except as stated above  PHYSICAL EXAM: BP (!) 143/86   Pulse 85   Temp 98 F (36.7 C) (Oral)   Ht 5\' 1"  (1.549 m)   Wt 161 lb 12.8 oz (73.4 kg)   SpO2 99%   BMI 30.57 kg/m   Physical Exam Repeat BP 140/90 General appearance - alert, well appearing, middle-age Hispanic female and in no distress Mental status - normal mood, behavior, speech, dress, motor activity, and thought processes Neurological - cranial nerves II through  XII intact, motor and sensory grossly normal bilaterally, Romberg sign negative, normal gait and station      Latest Ref Rng & Units 12/10/2021    1:30 PM 02/22/2021   10:57 AM 06/08/2020    4:46 PM  CMP  Glucose 70 - 99 mg/dL 92  92  99   BUN 6 - 24 mg/dL 11  7  9    Creatinine 0.57 - 1.00 mg/dL 1.61  0.96  0.45   Sodium 134 - 144 mmol/L 139  142  136   Potassium 3.5 - 5.2 mmol/L 4.5  4.5  4.0   Chloride 96 - 106 mmol/L 105  101  105   CO2 20 - 29 mmol/L  22  20   Calcium 8.7 - 10.2 mg/dL 9.3  9.9  9.2   Total Protein 6.0 - 8.5 g/dL 8.1  7.8  8.0   Total Bilirubin 0.0 - 1.2 mg/dL 0.3  0.4  0.3   Alkaline Phos 44 - 121 IU/L 71  76  68   AST 0 - 40 IU/L 24  27  35   ALT 0 - 32 IU/L  29  28    Lipid Panel     Component Value Date/Time   CHOL 251 (H) 02/22/2021 1057   TRIG 174 (H) 02/22/2021 1057   HDL 43 02/22/2021 1057   CHOLHDL 5.8 (H) 02/22/2021 1057   LDLCALC 176 (H) 02/22/2021 1057    CBC    Component Value Date/Time   WBC 6.5 05/09/2022 1104   WBC 9.6 06/08/2020 1646   RBC 3.87 05/09/2022 1104   RBC 3.86 (L) 06/08/2020 1646   HGB 11.1 05/09/2022 1104   HCT 33.8 (L) 05/09/2022 1104   PLT 337 05/09/2022 1104   MCV 87 05/09/2022 1104   MCH 28.7 05/09/2022 1104   MCH 26.2 06/08/2020 1646   MCHC 32.8 05/09/2022 1104   MCHC 29.6 (L) 06/08/2020 1646   RDW 16.4  (H) 05/09/2022 1104   LYMPHSABS 2.8 05/09/2022 1104   MONOABS 0.6 06/08/2020 1646   EOSABS 0.1 05/09/2022 1104   BASOSABS 0.0 05/09/2022 1104    ASSESSMENT AND PLAN: 1. Migraine without aura and without status migrainosus, not intractable -Inform patient that headaches seem consistent with migraines. She seems to get good abortive relief with medications similar to Excedrin Migraine.  Advised that she can purchase Excedrin Migraine over-the-counter and use as needed whenever she feels a headache coming on.  I recommend starting her on propranolol for prophylaxis.  This will also help with blood pressure that is elevated today.  However no history of hypertension in past. Will have her follow-up in several weeks to see how she is doing. - propranolol (INDERAL) 10 MG tablet; Take 1 tablet (10 mg total) by mouth 2 (two) times daily.  Dispense: 60 tablet; Refill: 1     Patient was given the opportunity to ask questions.  Patient verbalized understanding of the plan and was able to repeat key elements of the plan.   This documentation was completed using Paediatric nurse.  Any transcriptional errors are unintentional.  No orders of the defined types were placed in this encounter.    Requested Prescriptions    No prescriptions requested or ordered in this encounter    No follow-ups on file.  Jonah Blue, MD, FACP

## 2023-01-31 ENCOUNTER — Other Ambulatory Visit (HOSPITAL_COMMUNITY): Payer: Self-pay

## 2023-03-06 ENCOUNTER — Ambulatory Visit: Payer: Self-pay | Admitting: Internal Medicine

## 2023-03-10 ENCOUNTER — Ambulatory Visit: Payer: Self-pay | Attending: Internal Medicine | Admitting: Internal Medicine

## 2023-03-10 ENCOUNTER — Other Ambulatory Visit (HOSPITAL_COMMUNITY)
Admission: RE | Admit: 2023-03-10 | Discharge: 2023-03-10 | Disposition: A | Discharge status: Home / Self Care | Payer: Self-pay | Source: Ambulatory Visit | Attending: Internal Medicine | Admitting: Internal Medicine

## 2023-03-10 ENCOUNTER — Encounter: Payer: Self-pay | Admitting: Internal Medicine

## 2023-03-10 ENCOUNTER — Other Ambulatory Visit: Payer: Self-pay

## 2023-03-10 VITALS — BP 127/80 | HR 94 | Temp 98.4°F | Ht 61.0 in | Wt 162.0 lb

## 2023-03-10 DIAGNOSIS — Z1231 Encounter for screening mammogram for malignant neoplasm of breast: Secondary | ICD-10-CM

## 2023-03-10 DIAGNOSIS — Z124 Encounter for screening for malignant neoplasm of cervix: Secondary | ICD-10-CM

## 2023-03-10 DIAGNOSIS — G43009 Migraine without aura, not intractable, without status migrainosus: Secondary | ICD-10-CM

## 2023-03-10 MED ORDER — PROPRANOLOL HCL 10 MG PO TABS
10.0000 mg | ORAL_TABLET | Freq: Two times a day (BID) | ORAL | 4 refills | Status: DC
  Filled 2023-03-10: qty 60, 30d supply, fill #0
  Filled 2023-06-09: qty 60, 30d supply, fill #1

## 2023-03-10 NOTE — Progress Notes (Signed)
Patient ID: Cheryl Espinoza, female    DOB: Nov 11, 1975  MRN: [id number]  CC: Gynecologic Exam (Pap. Med refill. /Experiencing migraines)   Subjective: Cheryl Espinoza is a 47 y.o. female who presents for pap and f/u migraines Her concerns today include:  Patient with history of prediabetes, anemia, migraine headaches  On last visit, patient complained of recurrence of migraine type headaches.  She did not tolerate Imitrex in the past.  She had reported good relief with medications similar to Excedrin Migraine.  Advised using that for abortive therapy.  Blood pressure was elevated.  We started her on Inderal for migraine prophylaxis and to control blood pressure.  [employer] Language interpreter used during this encounter. #[id number] Reuel Boom.  My 1st yr medical student, Leonor Liv, is present.    Migraines:  started on Propranolol May to help decrease frequency.  Reports med has decreased the frequency quite a bit. Uses Excedrine Migraine about once a wk  GYN History:  Pt is G4P4 Any hx of abn paps?: reports abnormal pap about 2x/past with out having to do any additional procedure Menses regular or irregular?:  regular How long does menses last? 5 days Menstrual flow light or heavy?: sometimes heavy; other times moderate Method of birth control?:  thinks she had tubal ligation in her country after having her last child who is now 57 yrs old.   Any vaginal dischg at this time?:  no Dysuria?: no Any hx of STI?:  no Sexually active with how many partners: just one female partner Desires STI screen:  yes Last MMG:  2022.  Family hx of uterine, cervical or breast cancer?:     Patient Active Problem List   Diagnosis Date Noted   Overweight (BMI 25.0-29.9) 02/22/2021   Screening breast examination 05/11/2019   LGSIL on Pap smear of cervix 05/11/2019   Breast lump on right side at 11 o'clock position 05/11/2019   Breast lump on left side at 9 o'clock position 05/11/2019    Low grade squamous intraepith lesion on cytologic smear cervix (lgsil) 09/03/2017   Family history of diabetes mellitus in father 02/25/2017   Anemia 11/28/2016   Pilonidal cyst without abscess 05/03/2016   Vitamin D deficiency 03/16/2015   Allergic rhinitis 03/15/2015   Tinea pedis 03/15/2015   Thickened endometrium 09/22/2014   Dysmenorrhea 07/14/2014   Tension headache 07/14/2014   GERD (gastroesophageal reflux disease) 07/14/2014     Current Outpatient Medications on File Prior to Visit  Medication Sig Dispense Refill   ferrous sulfate 325 (65 FE) MG tablet Take 1 tablet (325 mg total) by mouth daily with breakfast. (Patient not taking: Reported on 05/09/2022) 100 tablet 1   fluticasone (FLONASE) 50 MCG/ACT nasal spray PLACE 2 SPRAYS INTO BOTH NOSTRILS DAILY. (Patient not taking: Reported on 01/09/2023) 16 g 2   ibuprofen (ADVIL,MOTRIN) 600 MG tablet Take 1 tablet (600 mg total) by mouth every 8 (eight) hours as needed (for chest wall pain; take after eating). (Patient not taking: Reported on 05/09/2022) 30 tablet 0   pantoprazole (PROTONIX) 40 MG tablet Take 1 tablet (40 mg total) by mouth daily. (Patient not taking: Reported on 05/09/2022) 30 tablet 1   promethazine (PHENERGAN) 12.5 MG tablet Take 1 tablet (12.5 mg total) by mouth every 8 (eight) hours as needed for nausea or vomiting. Or dizziness with headache (Patient not taking: Reported on 01/09/2023) 30 tablet 1   Vitamin D, Ergocalciferol, (DRISDOL) 1.25 MG (50000 UNIT) CAPS capsule Take 1 capsule (50,000  Units total) by mouth every 7 (seven) days. (Patient not taking: Reported on 05/09/2022) 12 capsule 1   No current facility-administered medications on file prior to visit.    No Known Allergies  Social History   Socioeconomic History   Marital status: Single    Spouse name: Not on file   Number of children: 3    Years of education: 9    Highest education level: 9th grade  Occupational History   Occupation: Restaurant Prep    Tobacco Use   Smoking status: Never   Smokeless tobacco: Never  Vaping Use   Vaping status: Never Used  Substance and Sexual Activity   Alcohol use: No    Alcohol/week: 0.0 standard drinks of alcohol   Drug use: No   Sexual activity: Yes    Birth control/protection: Pill  Other Topics Concern   Not on file  Social History Narrative   Lives at home with daughter.    29 yo daughter.    Two younger children live in [place]    Lived in [place] since 2004.    Social Determinants of Health   Financial Resource Strain: Not on file  Food Insecurity: Not on file  Transportation Needs: No Transportation Needs (08/31/2020)   PRAPARE - Administrator, Civil Service (Medical): No    Lack of Transportation (Non-Medical): No  Physical Activity: Insufficiently Active (11/11/2017)   Exercise Vital Sign    Days of Exercise per Week: 2 days    Minutes of Exercise per Session: 10 min  Stress: No Stress Concern Present (11/11/2017)   [employer] of Occupational Health - Occupational Stress Questionnaire    Feeling of Stress : Not at all  Social Connections: Moderately Isolated (11/11/2017)   Social Connection and Isolation Panel [NHANES]    Frequency of Communication with Friends and Family: More than three times a week    Frequency of Social Gatherings with Friends and Family: Once a week    Attends Religious Services: Never    Database administrator or Organizations: No    Attends Engineer, structural: Never    Marital Status: Never married  Catering manager Violence: Not on file    Family History  Problem Relation Age of Onset   Hyperlipidemia Mother    Diabetes Father    Diabetes Sister    Cancer Neg Hx    Heart disease Neg Hx     Past Surgical History:  Procedure Laterality Date   CESAREAN SECTION  2000, 2002     ROS: Review of Systems Negative except as stated above  PHYSICAL EXAM: BP 127/80 (BP Location: Left Arm, Patient Position: Sitting,  Cuff Size: Normal)   Pulse 94   Temp 98.4 F (36.9 C) (Oral)   Ht 5\' 1"  (1.549 m)   Wt 162 lb (73.5 kg)   SpO2 97%   BMI 30.61 kg/m   Physical Exam  General appearance - alert, well appearing, and in no distress Mental status - alert, oriented to person, place, and time Breasts - CMA Clarisa present for breast and pelvic exam:  breasts appear normal, no suspicious masses, no skin or nipple changes or axillary nodes Pelvic - normal external genitalia, vulva, vagina, cervix, uterus and adnexa      Latest Ref Rng & Units 12/10/2021    1:30 PM 02/22/2021   10:57 AM 06/08/2020    4:46 PM  CMP  Glucose 70 - 99 mg/dL 92  92  99  BUN 6 - 24 mg/dL 11  7  9    Creatinine 0.57 - 1.00 mg/dL 5.62  1.30  8.65   Sodium 134 - 144 mmol/L 139  142  136   Potassium 3.5 - 5.2 mmol/L 4.5  4.5  4.0   Chloride 96 - 106 mmol/L 105  101  105   CO2 20 - 29 mmol/L  22  20   Calcium 8.7 - 10.2 mg/dL 9.3  9.9  9.2   Total Protein 6.0 - 8.5 g/dL 8.1  7.8  8.0   Total Bilirubin 0.0 - 1.2 mg/dL 0.3  0.4  0.3   Alkaline Phos 44 - 121 IU/L 71  76  68   AST 0 - 40 IU/L 24  27  35   ALT 0 - 32 IU/L  29  28    Lipid Panel     Component Value Date/Time   CHOL 251 (H) 02/22/2021 1057   TRIG 174 (H) 02/22/2021 1057   HDL 43 02/22/2021 1057   CHOLHDL 5.8 (H) 02/22/2021 1057   LDLCALC 176 (H) 02/22/2021 1057    CBC    Component Value Date/Time   WBC 6.5 05/09/2022 1104   WBC 9.6 06/08/2020 1646   RBC 3.87 05/09/2022 1104   RBC 3.86 (L) 06/08/2020 1646   HGB 11.1 05/09/2022 1104   HCT 33.8 (L) 05/09/2022 1104   PLT 337 05/09/2022 1104   MCV 87 05/09/2022 1104   MCH 28.7 05/09/2022 1104   MCH 26.2 06/08/2020 1646   MCHC 32.8 05/09/2022 1104   MCHC 29.6 (L) 06/08/2020 1646   RDW 16.4 (H) 05/09/2022 1104   LYMPHSABS 2.8 05/09/2022 1104   MONOABS 0.6 06/08/2020 1646   EOSABS 0.1 05/09/2022 1104   BASOSABS 0.0 05/09/2022 1104    ASSESSMENT AND PLAN: 1. Pap smear for cervical cancer screening -  Cytology - PAP - Cervicovaginal ancillary only  2. Migraine without aura and without status migrainosus, not intractable Clinically improved moved with decreased frequency. Continue propranolol for prophylaxis and Excedrin Migraine for abortive therapy - propranolol (INDERAL) 10 MG tablet; Take 1 tablet (10 mg total) by mouth 2 (two) times daily.  Dispense: 60 tablet; Refill: 4  3. Encounter for screening mammogram for malignant neoplasm of breast  - MM Digital Screening; Future    Patient was given the opportunity to ask questions.  Patient verbalized understanding of the plan and was able to repeat key elements of the plan.   This documentation was completed using Paediatric nurse.  Any transcriptional errors are unintentional.  Orders Placed This Encounter  Procedures   MM Digital Screening     Requested Prescriptions   Signed Prescriptions Disp Refills   propranolol (INDERAL) 10 MG tablet 60 tablet 4    Sig: Take 1 tablet (10 mg total) by mouth 2 (two) times daily.    Return in about 4 months (around 07/11/2023).  Jonah Blue, MD, FACP

## 2023-03-11 ENCOUNTER — Other Ambulatory Visit: Payer: Self-pay | Admitting: Internal Medicine

## 2023-03-11 LAB — CERVICOVAGINAL ANCILLARY ONLY
Bacterial Vaginitis (gardnerella): NEGATIVE
Candida Glabrata: NEGATIVE
Candida Vaginitis: POSITIVE — AB
Chlamydia: NEGATIVE
Comment: NEGATIVE
Comment: NEGATIVE
Comment: NEGATIVE
Comment: NEGATIVE
Comment: NEGATIVE
Comment: NORMAL
Neisseria Gonorrhea: NEGATIVE
Trichomonas: NEGATIVE

## 2023-03-11 MED ORDER — FLUCONAZOLE 150 MG PO TABS
150.0000 mg | ORAL_TABLET | Freq: Every day | ORAL | 0 refills | Status: DC
  Filled 2023-03-11: qty 1, 1d supply, fill #0

## 2023-03-12 ENCOUNTER — Other Ambulatory Visit: Payer: Self-pay

## 2023-03-13 ENCOUNTER — Other Ambulatory Visit: Payer: Self-pay

## 2023-03-13 LAB — CYTOLOGY - PAP
Adequacy: ABSENT
Comment: NEGATIVE
High risk HPV: NEGATIVE

## 2023-03-27 ENCOUNTER — Ambulatory Visit
Admission: RE | Admit: 2023-03-27 | Discharge: 2023-03-27 | Disposition: A | Discharge status: Home / Self Care | Payer: No Typology Code available for payment source | Source: Ambulatory Visit | Attending: Internal Medicine | Admitting: Internal Medicine

## 2023-03-27 DIAGNOSIS — Z1231 Encounter for screening mammogram for malignant neoplasm of breast: Secondary | ICD-10-CM

## 2023-03-31 ENCOUNTER — Other Ambulatory Visit: Payer: Self-pay | Admitting: Internal Medicine

## 2023-03-31 DIAGNOSIS — R928 Other abnormal and inconclusive findings on diagnostic imaging of breast: Secondary | ICD-10-CM

## 2023-04-03 ENCOUNTER — Ambulatory Visit: Payer: No Typology Code available for payment source

## 2023-04-14 ENCOUNTER — Other Ambulatory Visit: Payer: No Typology Code available for payment source

## 2023-04-15 ENCOUNTER — Ambulatory Visit: Payer: Self-pay | Admitting: Hematology and Oncology

## 2023-04-15 VITALS — Wt 161.8 lb

## 2023-04-15 DIAGNOSIS — N6489 Other specified disorders of breast: Secondary | ICD-10-CM

## 2023-04-15 NOTE — Progress Notes (Signed)
[name] is a 47 y.o. female who presents to [facility] clinic today with no complaints. Follow up possible asymmetry in left breast noted on screening mammogram.   Pap Smear: Pap not smear completed today. Last Pap smear was 03/10/23 and was abnormal - LSIL/ HPV- . Per patient has history of an abnormal Pap smear. Last Pap smear result is available in Epic. 12/23/19 - Normal/ HPV-; 04/22/19 - LSIL/ HPV-; 08/29/2017 LSIL/ HPV+   Physical exam: Breasts Breasts symmetrical. No skin abnormalities bilateral breasts. No nipple retraction bilateral breasts. No nipple discharge bilateral breasts. No lymphadenopathy. No lumps palpated bilateral breasts.       Pelvic/Bimanual Pap is not indicated today    Smoking History: Patient has never smoked and was not referred to quit line.    Patient Navigation: Patient education provided. Access to services provided for patient through [facility] program. Cheryl Espinoza interpreter provided. No transportation provided   Colorectal Cancer Screening: Per patient has never had colonoscopy completed No complaints today. Refused FIT testing   Breast and Cervical Cancer Risk Assessment: Patient does not have family history of breast cancer, known genetic mutations, or radiation treatment to the chest before age 95. Patient has history of cervical dysplasia, immunocompromised, or DES exposure in-utero.  Risk Scores as of Encounter on 04/15/2023     Cheryl Espinoza           5-year 0.54%   Lifetime 6.4%   This patient is Hisp[place]/Latina but has no documented birth country, so the [employer] model used data from [place] patients to calculate their risk score. Document a birth country in the Demographics activity for a more accurate score.         Last calculated by Cheryl Espinoza, CMA on 04/15/2023 at  2:36 PM        A: [facility] exam without pap smear No complaints with benign exam. Follow up possible left breast asymmetry.  P: Referred patient to  the [facility] for a diagnostic mammogram. Appointment scheduled 04/17/23.  Pascal Lux, NP 04/15/2023 3:33 PM

## 2023-04-15 NOTE — Patient Instructions (Signed)
Taught Cheryl Espinoza about self breast awareness and gave educational materials to take home. Patient did not need a Pap smear today due to last Pap smear was in 03/10/23 per patient.  Let her know [facility]CP will cover Pap smears every 3 years unless has a history of abnormal Pap smears. Referred patient to the [facility] for diagnostic mammogram. Appointment scheduled for 04/17/23. Patient aware of appointment and will be there. Let patient know will follow up with her within the next couple weeks with results. Cheryl Espinoza verbalized understanding.  Pascal Lux, NP 3:38 PM

## 2023-04-17 ENCOUNTER — Ambulatory Visit: Payer: No Typology Code available for payment source

## 2023-04-17 ENCOUNTER — Ambulatory Visit
Admission: RE | Admit: 2023-04-17 | Discharge: 2023-04-17 | Disposition: A | Discharge status: Home / Self Care | Payer: No Typology Code available for payment source | Source: Ambulatory Visit | Attending: Internal Medicine | Admitting: Internal Medicine

## 2023-04-17 DIAGNOSIS — R928 Other abnormal and inconclusive findings on diagnostic imaging of breast: Secondary | ICD-10-CM

## 2023-04-23 ENCOUNTER — Telehealth: Payer: Self-pay

## 2023-04-23 NOTE — Telephone Encounter (Signed)
Using [employer] [phone].   Pt given imaging results per notes of Dr. Laural Benes on 04/17/23. Pt verbalized understanding.

## 2023-05-20 ENCOUNTER — Ambulatory Visit: Payer: No Typology Code available for payment source

## 2023-06-09 ENCOUNTER — Other Ambulatory Visit: Payer: Self-pay

## 2023-07-14 ENCOUNTER — Other Ambulatory Visit: Payer: Self-pay

## 2023-07-14 ENCOUNTER — Encounter: Payer: Self-pay | Admitting: Internal Medicine

## 2023-07-14 ENCOUNTER — Ambulatory Visit: Payer: Self-pay | Attending: Internal Medicine | Admitting: Internal Medicine

## 2023-07-14 ENCOUNTER — Other Ambulatory Visit (HOSPITAL_COMMUNITY): Payer: Self-pay

## 2023-07-14 VITALS — BP 128/83 | HR 88 | Temp 98.3°F | Ht 61.0 in | Wt 159.0 lb

## 2023-07-14 DIAGNOSIS — Z23 Encounter for immunization: Secondary | ICD-10-CM

## 2023-07-14 DIAGNOSIS — M79672 Pain in left foot: Secondary | ICD-10-CM

## 2023-07-14 DIAGNOSIS — M79671 Pain in right foot: Secondary | ICD-10-CM

## 2023-07-14 DIAGNOSIS — Z1211 Encounter for screening for malignant neoplasm of colon: Secondary | ICD-10-CM

## 2023-07-14 DIAGNOSIS — H5203 Hypermetropia, bilateral: Secondary | ICD-10-CM

## 2023-07-14 MED ORDER — FLULAVAL 0.5 ML IM SUSY
0.5000 mL | PREFILLED_SYRINGE | Freq: Once | INTRAMUSCULAR | 0 refills | Status: AC
Start: 2023-07-14 — End: 2023-07-15
  Filled 2023-07-14: qty 0.5, 1d supply, fill #0

## 2023-07-14 MED ORDER — IBUPROFEN 600 MG PO TABS
600.0000 mg | ORAL_TABLET | Freq: Three times a day (TID) | ORAL | 0 refills | Status: DC | PRN
Start: 2023-07-14 — End: 2024-01-12
  Filled 2023-07-14: qty 60, 20d supply, fill #0

## 2023-07-14 NOTE — Progress Notes (Signed)
Patient ID: Cheryl Espinoza, female    DOB: 10/31/1975  MRN: [id number]  CC: Follow-up (Follow-up. Med refill. Nicki Reaper that injections have helped foot pain but pain remaining on heel & arch of R foot, mass on arch of R foot /Concern about blurry vision - requesting referral to ophthalmologist /Instructed to go to our pharmacy for flu vax. No to colonoscopy.)   Subjective: Cheryl Espinoza is a 47 y.o. female who presents for chronic ds management. Her concerns today include:  Patient with history of prediabetes, anemia, migraine headaches  [employer]N Language interpreter used during this encounter. #[id number]   Request referral to ophthalmologist.  Has problems reading things close up.  Does not wear correction lenses.   Other concern is pain in heel and arch both feet. Had seen podiatrist, Dr, Lilian Kapur, 01/2021 and had injection to both medial heels.  Injections helped a little.  Now has swelling in the area where injection was applied.  Wears comfortable shoes with thick soles.   -pain worse when walking Not taking anything for pain  Patient Active Problem List   Diagnosis Date Noted   Overweight (BMI 25.0-29.9) 02/22/2021   Screening breast examination 05/11/2019   LGSIL on Pap smear of cervix 05/11/2019   Breast lump on right side at 11 o'clock position 05/11/2019   Breast lump on left side at 9 o'clock position 05/11/2019   Low grade squamous intraepith lesion on cytologic smear cervix (lgsil) 09/03/2017   Family history of diabetes mellitus in father 02/25/2017   Anemia 11/28/2016   Pilonidal cyst without abscess 05/03/2016   Vitamin D deficiency 03/16/2015   Allergic rhinitis 03/15/2015   Tinea pedis 03/15/2015   Thickened endometrium 09/22/2014   Dysmenorrhea 07/14/2014   Tension headache 07/14/2014   GERD (gastroesophageal reflux disease) 07/14/2014     Current Outpatient Medications on File Prior to Visit  Medication Sig Dispense Refill    ibuprofen (ADVIL,MOTRIN) 600 MG tablet Take 1 tablet (600 mg total) by mouth every 8 (eight) hours as needed (for chest wall pain; take after eating). 30 tablet 0   promethazine (PHENERGAN) 12.5 MG tablet Take 1 tablet (12.5 mg total) by mouth every 8 (eight) hours as needed for nausea or vomiting. Or dizziness with headache 30 tablet 1   ferrous sulfate 325 (65 FE) MG tablet Take 1 tablet (325 mg total) by mouth daily with breakfast. (Patient not taking: Reported on 07/14/2023) 100 tablet 1   fluticasone (FLONASE) 50 MCG/ACT nasal spray PLACE 2 SPRAYS INTO BOTH NOSTRILS DAILY. (Patient not taking: Reported on 01/09/2023) 16 g 2   pantoprazole (PROTONIX) 40 MG tablet Take 1 tablet (40 mg total) by mouth daily. (Patient not taking: Reported on 05/09/2022) 30 tablet 1   propranolol (INDERAL) 10 MG tablet Take 1 tablet (10 mg total) by mouth 2 (two) times daily. (Patient not taking: Reported on 04/15/2023) 60 tablet 4   Vitamin D, Ergocalciferol, (DRISDOL) 1.25 MG (50000 UNIT) CAPS capsule Take 1 capsule (50,000 Units total) by mouth every 7 (seven) days. (Patient not taking: Reported on 07/14/2023) 12 capsule 1   No current facility-administered medications on file prior to visit.    No Known Allergies  Social History   Socioeconomic History   Marital status: Single    Spouse name: Not on file   Number of children: 3    Years of education: 9    Highest education level: 9th grade  Occupational History   Occupation: Restaurant Prep   Tobacco  Use   Smoking status: Never   Smokeless tobacco: Never  Vaping Use   Vaping status: Never Used  Substance and Sexual Activity   Alcohol use: No    Alcohol/week: 0.0 standard drinks of alcohol   Drug use: No   Sexual activity: Yes    Birth control/protection: Pill  Other Topics Concern   Not on file  Social History Narrative   Lives at home with daughter.    32 yo daughter.    Two younger children live in [place]    Lived in [place] since 2004.     Social Determinants of Health   Financial Resource Strain: Not on file  Food Insecurity: No Food Insecurity (07/14/2023)   Hunger Vital Sign    Worried About Running Out of Food in the Last Year: Never true    Ran Out of Food in the Last Year: Never true  Transportation Needs: No Transportation Needs (07/14/2023)   PRAPARE - Administrator, Civil Service (Medical): No    Lack of Transportation (Non-Medical): No  Physical Activity: Insufficiently Active (11/11/2017)   Exercise Vital Sign    Days of Exercise per Week: 2 days    Minutes of Exercise per Session: 10 min  Stress: No Stress Concern Present (11/11/2017)   [employer] of Occupational Health - Occupational Stress Questionnaire    Feeling of Stress : Not at all  Social Connections: Moderately Isolated (11/11/2017)   Social Connection and Isolation Panel [NHANES]    Frequency of Communication with Friends and Family: More than three times a week    Frequency of Social Gatherings with Friends and Family: Once a week    Attends Religious Services: Never    Database administrator or Organizations: No    Attends Banker Meetings: Never    Marital Status: Never married  Intimate Partner Violence: Not At Risk (07/14/2023)   Humiliation, Afraid, Rape, and Kick questionnaire    Fear of Current or Ex-Partner: No    Emotionally Abused: No    Physically Abused: No    Sexually Abused: No    Family History  Problem Relation Age of Onset   Hyperlipidemia Mother    Diabetes Father    Diabetes Sister    Cancer Neg Hx    Heart disease Neg Hx    Breast cancer Neg Hx     Past Surgical History:  Procedure Laterality Date   CESAREAN SECTION  2000, 2002     ROS: Review of Systems Negative except as stated above  PHYSICAL EXAM: BP 128/83 (BP Location: Left Arm, Patient Position: Sitting, Cuff Size: Normal)   Pulse 88   Temp 98.3 F (36.8 C) (Oral)   Ht 5\' 1"  (1.549 m)   Wt 159 lb (72.1 kg)    SpO2 98%   BMI 30.04 kg/m   Physical Exam  General appearance - alert, well appearing, and in no distress Mental status - normal mood, behavior, speech, dress, motor activity, and thought processes Musculoskeletal - Feet: Patient with  soft tissue swelling on the medial heel of both feet right greater than left.  Size is about 3 cm.  She has mild tenderness on palpation of the plantar surface of both heels..      Latest Ref Rng & Units 12/10/2021    1:30 PM 02/22/2021   10:57 AM 06/08/2020    4:46 PM  CMP  Glucose 70 - 99 mg/dL 92  92  99   BUN  6 - 24 mg/dL 11  7  9    Creatinine 0.57 - 1.00 mg/dL 0.27  2.53  6.64   Sodium 134 - 144 mmol/L 139  142  136   Potassium 3.5 - 5.2 mmol/L 4.5  4.5  4.0   Chloride 96 - 106 mmol/L 105  101  105   CO2 20 - 29 mmol/L  22  20   Calcium 8.7 - 10.2 mg/dL 9.3  9.9  9.2   Total Protein 6.0 - 8.5 g/dL 8.1  7.8  8.0   Total Bilirubin 0.0 - 1.2 mg/dL 0.3  0.4  0.3   Alkaline Phos 44 - 121 IU/L 71  76  68   AST 0 - 40 IU/L 24  27  35   ALT 0 - 32 IU/L  29  28    Lipid Panel     Component Value Date/Time   CHOL 251 (H) 02/22/2021 1057   TRIG 174 (H) 02/22/2021 1057   HDL 43 02/22/2021 1057   CHOLHDL 5.8 (H) 02/22/2021 1057   LDLCALC 176 (H) 02/22/2021 1057    CBC    Component Value Date/Time   WBC 6.5 05/09/2022 1104   WBC 9.6 06/08/2020 1646   RBC 3.87 05/09/2022 1104   RBC 3.86 (L) 06/08/2020 1646   HGB 11.1 05/09/2022 1104   HCT 33.8 (L) 05/09/2022 1104   PLT 337 05/09/2022 1104   MCV 87 05/09/2022 1104   MCH 28.7 05/09/2022 1104   MCH 26.2 06/08/2020 1646   MCHC 32.8 05/09/2022 1104   MCHC 29.6 (L) 06/08/2020 1646   RDW 16.4 (H) 05/09/2022 1104   LYMPHSABS 2.8 05/09/2022 1104   MONOABS 0.6 06/08/2020 1646   EOSABS 0.1 05/09/2022 1104   BASOSABS 0.0 05/09/2022 1104    ASSESSMENT AND PLAN: 1. Pain of both heels Recommend alternating hot and cold compresses. Run heel over tennis ball for 10 to 15 minutes when seated.  Wear  shoes with good support. - Ambulatory referral to Podiatry - ibuprofen (ADVIL) 600 MG tablet; Take 1 tablet (600 mg total) by mouth every 8 (eight) hours as needed (take with food.).  Dispense: 60 tablet; Refill: 0  2. Hyperopia of both eyes - Ambulatory referral to Optometry  3. Need for influenza vaccination - influenza vac split trivalent PF (FLULAVAL) 0.5 ML injection; Inject 0.5 mLs into the muscle once for 1 dose.  Dispense: 0.5 mL; Refill: 0  4. Screening for colon cancer - Fecal occult blood, imunochemical([facility])    Patient was given the opportunity to ask questions.  Patient verbalized understanding of the plan and was able to repeat key elements of the plan.   This documentation was completed using Paediatric nurse.  Any transcriptional errors are unintentional.  No orders of the defined types were placed in this encounter.    Requested Prescriptions   Pending Prescriptions Disp Refills   promethazine (PHENERGAN) 12.5 MG tablet 30 tablet 1    Sig: Take 1 tablet (12.5 mg total) by mouth every 8 (eight) hours as needed for nausea or vomiting. Or dizziness with headache    No follow-ups on file.  Jonah Blue, MD, FACP

## 2023-07-15 LAB — FECAL OCCULT BLOOD, IMMUNOCHEMICAL: Fecal Occult Bld: NEGATIVE

## 2023-08-11 ENCOUNTER — Encounter: Payer: Self-pay | Admitting: Podiatry

## 2023-08-11 ENCOUNTER — Ambulatory Visit (INDEPENDENT_AMBULATORY_CARE_PROVIDER_SITE_OTHER): Payer: Self-pay

## 2023-08-11 ENCOUNTER — Other Ambulatory Visit: Payer: Self-pay

## 2023-08-11 ENCOUNTER — Ambulatory Visit (INDEPENDENT_AMBULATORY_CARE_PROVIDER_SITE_OTHER): Payer: Self-pay | Admitting: Podiatry

## 2023-08-11 DIAGNOSIS — M79673 Pain in unspecified foot: Secondary | ICD-10-CM

## 2023-08-11 DIAGNOSIS — G8929 Other chronic pain: Secondary | ICD-10-CM

## 2023-08-11 MED ORDER — DICLOFENAC SODIUM 75 MG PO TBEC
75.0000 mg | DELAYED_RELEASE_TABLET | Freq: Two times a day (BID) | ORAL | 2 refills | Status: DC
  Filled 2023-08-11: qty 50, 25d supply, fill #0

## 2023-08-11 MED ORDER — TRIAMCINOLONE ACETONIDE 10 MG/ML IJ SUSP
10.0000 mg | Freq: Once | INTRAMUSCULAR | Status: AC
Start: 2023-08-11 — End: ?

## 2023-08-11 NOTE — Progress Notes (Signed)
Subjective:   Patient ID: Cheryl Espinoza, female   DOB: 47 y.o.   MRN: [id number]   HPI Patient presents with exquisite discomfort plantar aspect both heel has not been here for 2-1/2 years.  States it has been sore making it hard to wear shoe gear comfortably   ROS      Objective:  Physical Exam  Acute plantar fasciitis bilateral inflammation fluid of the medial band     Assessment:  Pain upon palpation medial band fascia bilateral 3 months duration     Plan:  H&P discussed how well it did for over 2 years with translator present in today did sterile prep injected the plantar fascia bilateral 3 mg Kenalog 5 mg Xylocaine placed on diclofenac reappoint 3 weeks  X-rays indicate small spur no indication stress fracture arthritis

## 2023-09-03 ENCOUNTER — Ambulatory Visit: Payer: Self-pay | Admitting: Podiatry

## 2023-09-03 ENCOUNTER — Encounter: Payer: Self-pay | Admitting: Podiatry

## 2023-09-03 DIAGNOSIS — M79673 Pain in unspecified foot: Secondary | ICD-10-CM

## 2023-09-03 DIAGNOSIS — G8929 Other chronic pain: Secondary | ICD-10-CM

## 2023-09-03 MED ORDER — TRIAMCINOLONE ACETONIDE 10 MG/ML IJ SUSP
10.0000 mg | Freq: Once | INTRAMUSCULAR | Status: AC
Start: 2023-09-03 — End: 2023-09-03
  Administered 2023-09-03: 10 mg via INTRA_ARTICULAR

## 2023-09-03 NOTE — Progress Notes (Signed)
 Subjective:   Patient ID: Cheryl Espinoza, female   DOB: 48 y.o.   MRN: [id number]   HPI Patient states improved but still having discomfort in the bottom of the heels neuro vas   ROS      Objective:  Physical Exam  Ocular status intact with discomfort in the plantar fascia insertion bilateral that has improved but is still painful     Assessment:  Plantar fasciitis bilateral improved painful with patient having interpreter     Plan:  H&P reviewed organ to do 1 last steroid injection I explained the procedure hopefully this will be the end of the problems along with shoe gear support and I did sterile prep injected the plantar fascia insertion bilateral 3 mg Kenalog  5 mg Xylocaine

## 2024-01-12 ENCOUNTER — Ambulatory Visit: Payer: Self-pay | Attending: Internal Medicine | Admitting: Internal Medicine

## 2024-01-12 ENCOUNTER — Encounter: Payer: Self-pay | Admitting: Internal Medicine

## 2024-01-12 ENCOUNTER — Other Ambulatory Visit: Payer: Self-pay

## 2024-01-12 VITALS — BP 119/75 | HR 86 | Temp 97.9°F | Ht 61.0 in | Wt 160.0 lb

## 2024-01-12 DIAGNOSIS — Z6830 Body mass index (BMI) 30.0-30.9, adult: Secondary | ICD-10-CM

## 2024-01-12 DIAGNOSIS — Z7984 Long term (current) use of oral hypoglycemic drugs: Secondary | ICD-10-CM

## 2024-01-12 DIAGNOSIS — H538 Other visual disturbances: Secondary | ICD-10-CM

## 2024-01-12 DIAGNOSIS — E669 Obesity, unspecified: Secondary | ICD-10-CM

## 2024-01-12 DIAGNOSIS — N6310 Unspecified lump in the right breast, unspecified quadrant: Secondary | ICD-10-CM

## 2024-01-12 DIAGNOSIS — E66811 Obesity, class 1: Secondary | ICD-10-CM

## 2024-01-12 DIAGNOSIS — R7303 Prediabetes: Secondary | ICD-10-CM

## 2024-01-12 LAB — POCT GLYCOSYLATED HEMOGLOBIN (HGB A1C): HbA1c, POC (prediabetic range): 5.9 % (ref 5.7–6.4)

## 2024-01-12 LAB — GLUCOSE, POCT (MANUAL RESULT ENTRY): POC Glucose: 176 mg/dL — AB (ref 70–99)

## 2024-01-12 MED ORDER — METFORMIN HCL 500 MG PO TABS
500.0000 mg | ORAL_TABLET | Freq: Every day | ORAL | 1 refills | Status: AC
Start: 2024-01-12 — End: ?
  Filled 2024-01-12: qty 90, 90d supply, fill #0

## 2024-01-12 MED ORDER — DICLOFENAC SODIUM 75 MG PO TBEC
75.0000 mg | DELAYED_RELEASE_TABLET | Freq: Two times a day (BID) | ORAL | 0 refills | Status: DC | PRN
  Filled 2024-01-12: qty 30, 15d supply, fill #0

## 2024-01-12 NOTE — Progress Notes (Signed)
 Patient ID: Cheryl Espinoza, female    DOB: [birth year]  MRN: [id number]  CC: Follow-up (Follow-up. Med refill. /Intermittent cloudy vision  X1 mo/Lump on R breast, mild pain x3 mo)   Subjective: Breleigh Carpino is a 48 y.o. female who presents for chronic ds management. Her concerns today include:  Patient with history of prediabetes, anemia, migraine headaches   AMN Language interpreter used during this encounter. #[email or web address] [id number]  Pt discover lump in RT for more than 1 mth. Painful if she presses on it No fhx breast CA. Last MMG 03/2023. Hx of cyst in RT breast in past   C/o cloudy vision x Has problems reading letters when watching TV at times. Eyes get watery Sometimes she gets headaches when she has to strain vision Refer to optometry on last visit.  Has OC which does not cover for eye exam.  PreDM:  A1C 5.9/BS 176 Walks for exercises 2x/wk     Patient Active Problem List   Diagnosis Date Noted   Overweight (BMI 25.0-29.9) 02/22/2021   Screening breast examination 05/11/2019   LGSIL on Pap smear of cervix 05/11/2019   Breast lump on right side at 11 o'clock position 05/11/2019   Breast lump on left side at 9 o'clock position 05/11/2019   Low grade squamous intraepith lesion on cytologic smear cervix (lgsil) 09/03/2017   Family history of diabetes mellitus in father 02/25/2017   Anemia 11/28/2016   Pilonidal cyst without abscess 05/03/2016   Vitamin D  deficiency 03/16/2015   Allergic rhinitis 03/15/2015   Tinea pedis 03/15/2015   Thickened endometrium 09/22/2014   Dysmenorrhea 07/14/2014   Tension headache 07/14/2014   GERD (gastroesophageal reflux disease) 07/14/2014     No current outpatient medications on file prior to visit.   Current Facility-Administered Medications on File Prior to Visit  Medication Dose Route Frequency Provider Last Rate Last Admin   triamcinolone  acetonide (KENALOG ) 10 MG/ML injection 10 mg  10 mg  Intra-articular Once         No Known Allergies  Social History   Socioeconomic History   Marital status: Single    Spouse name: Not on file   Number of children: 3    Years of education: 9    Highest education level: 9th grade  Occupational History   Occupation: Restaurant Prep   Tobacco Use   Smoking status: Never   Smokeless tobacco: Never  Vaping Use   Vaping status: Never Used  Substance and Sexual Activity   Alcohol use: No    Alcohol/week: 0.0 standard drinks of alcohol   Drug use: No   Sexual activity: Yes    Birth control/protection: Pill  Other Topics Concern   Not on file  Social History Narrative   Lives at home with daughter.    69 yo daughter.    Two younger children live in [place]    Lived in [place]  since 2004.    Social Drivers of Corporate investment banker Strain: Not on file  Food Insecurity: No Food Insecurity (07/14/2023)   Hunger Vital Sign    Worried About Running Out of Food in the Last Year: Never true    Ran Out of Food in the Last Year: Never true  Transportation Needs: No Transportation Needs (07/14/2023)   PRAPARE - Administrator, Civil Service (Medical): No    Lack of Transportation (Non-Medical): No  Physical Activity: Insufficiently Active (11/11/2017)   Exercise Vital Sign  Days of Exercise per Week: 2 days    Minutes of Exercise per Session: 10 min  Stress: No Stress Concern Present (11/11/2017)   [employer] of Occupational Health - Occupational Stress Questionnaire    Feeling of Stress : Not at all  Social Connections: Moderately Isolated (11/11/2017)   Social Connection and Isolation Panel [NHANES]    Frequency of Communication with Friends and Family: More than three times a week    Frequency of Social Gatherings with Friends and Family: Once a week    Attends Religious Services: Never    Database administrator or Organizations: No    Attends Banker Meetings: Never    Marital Status:  Never married  Intimate Partner Violence: Not At Risk (07/14/2023)   Humiliation, Afraid, Rape, and Kick questionnaire    Fear of Current or Ex-Partner: No    Emotionally Abused: No    Physically Abused: No    Sexually Abused: No    Family History  Problem Relation Age of Onset   Hyperlipidemia Mother    Diabetes Father    Diabetes Sister    Cancer Neg Hx    Heart disease Neg Hx    Breast cancer Neg Hx     Past Surgical History:  Procedure Laterality Date   CESAREAN SECTION  2000, 2002     ROS: Review of Systems Negative except as stated above  PHYSICAL EXAM: BP 119/75 (BP Location: Left Arm, Patient Position: Sitting, Cuff Size: Normal)   Pulse 86   Temp 97.9 F (36.6 C) (Oral)   Ht 5\' 1"  (1.549 m)   Wt 160 lb (72.6 kg)   SpO2 99%   BMI 30.23 kg/m   Physical Exam  General appearance - alert, well appearing, and in no distress Mental status - normal mood, behavior, speech, dress, motor activity, and thought processes Chest - clear to auscultation, no wheezes, rales or rhonchi, symmetric air entry Heart - normal rate, regular rhythm, normal S1, S2, no murmurs, rubs, clicks or gallops Breasts -patient has a fairly large mass palpated in the right breast above the nipple measuring 3 to 4 cm in greatest diameter.  It is not tender to touch.  No axillary lymphadenopathy.  No masses felt in the left breast. Extremities - peripheral pulses normal, no pedal edema, no clubbing or cyanosis      Latest Ref Rng & Units 12/10/2021    1:30 PM 02/22/2021   10:57 AM 06/08/2020    4:46 PM  CMP  Glucose 70 - 99 mg/dL 92  92  99   BUN 6 - 24 mg/dL 11  7  9    Creatinine 0.57 - 1.00 mg/dL 1.61  0.96  0.45   Sodium 134 - 144 mmol/L 139  142  136   Potassium 3.5 - 5.2 mmol/L 4.5  4.5  4.0   Chloride 96 - 106 mmol/L 105  101  105   CO2 20 - 29 mmol/L  22  20   Calcium 8.7 - 10.2 mg/dL 9.3  9.9  9.2   Total Protein 6.0 - 8.5 g/dL 8.1  7.8  8.0   Total Bilirubin 0.0 - 1.2 mg/dL 0.3   0.4  0.3   Alkaline Phos 44 - 121 IU/L 71  76  68   AST 0 - 40 IU/L 24  27  35   ALT 0 - 32 IU/L  29  28    Lipid Panel     Component Value Date/Time  CHOL 251 (H) 02/22/2021 1057   TRIG 174 (H) 02/22/2021 1057   HDL 43 02/22/2021 1057   CHOLHDL 5.8 (H) 02/22/2021 1057   LDLCALC 176 (H) 02/22/2021 1057    CBC    Component Value Date/Time   WBC 6.5 05/09/2022 1104   WBC 9.6 06/08/2020 1646   RBC 3.87 05/09/2022 1104   RBC 3.86 (L) 06/08/2020 1646   HGB 11.1 05/09/2022 1104   HCT 33.8 (L) 05/09/2022 1104   PLT 337 05/09/2022 1104   MCV 87 05/09/2022 1104   MCH 28.7 05/09/2022 1104   MCH 26.2 06/08/2020 1646   MCHC 32.8 05/09/2022 1104   MCHC 29.6 (L) 06/08/2020 1646   RDW 16.4 (H) 05/09/2022 1104   LYMPHSABS 2.8 05/09/2022 1104   MONOABS 0.6 06/08/2020 1646   EOSABS 0.1 05/09/2022 1104   BASOSABS 0.0 05/09/2022 1104   Results for orders placed or performed in visit on 01/12/24  POCT glycosylated hemoglobin (Hb A1C)   Collection Time: 01/12/24  5:33 PM  Result Value Ref Range   Hemoglobin A1C     HbA1c POC (<> result, manual entry)     HbA1c, POC (prediabetic range) 5.9 5.7 - 6.4 %   HbA1c, POC (controlled diabetic range)    POCT glucose (manual entry)   Collection Time: 01/12/24  5:33 PM  Result Value Ref Range   POC Glucose 176 (A) 70 - 99 mg/dl    ASSESSMENT AND PLAN: 1. Blurred vision (Primary) Advised patient to get an eye exam done.  Most cost-effective place to get an eye exam would be at [facility]  2. Prediabetes Patient advised to eliminate sugary drinks from the diet, cut back on portion sizes especially of white carbohydrates, eat more white lean meat like chicken [place] and seafood instead of beef or pork and incorporate fresh fruits and vegetables into the diet daily. Discussed the importance of getting in some form of moderate intensity exercise at least 3 to 5 days a week for 30 minutes. Start Metformin  500 mg daily to help prevent  progression to DM - POCT glycosylated hemoglobin (Hb A1C) - POCT glucose (manual entry) - metFORMIN  (GLUCOPHAGE ) 500 MG tablet; Take 1 tablet (500 mg total) by mouth daily with breakfast.  Dispense: 90 tablet; Refill: 1  3. Mass of right breast, unspecified quadrant Given mammogram scholarship.  Advised patient that if she is not called within 2 weeks from the breast center to schedule, please call us  back to let us  know so that we can help facilitate this further. - MM 3D SCREENING MAMMOGRAM UNILATERAL RIGHT BREAST; Future  4. Obesity (BMI 30.0-34.9) See #2 above. - CBC - Comprehensive metabolic panel with GFR - Lipid panel   Patient was given the opportunity to ask questions.  Patient verbalized understanding of the plan and was able to repeat key elements of the plan.   This documentation was completed using Paediatric nurse.  Any transcriptional errors are unintentional.  Orders Placed This Encounter  Procedures   MM 3D SCREENING MAMMOGRAM UNILATERAL RIGHT BREAST   CBC   Comprehensive metabolic panel with GFR   Lipid panel   POCT glycosylated hemoglobin (Hb A1C)   POCT glucose (manual entry)     Requested Prescriptions   Signed Prescriptions Disp Refills   diclofenac  (VOLTAREN ) 75 MG EC tablet 30 tablet 0    Sig: Take 1 tablet (75 mg total) by mouth 2 (two) times daily as needed.   metFORMIN  (GLUCOPHAGE ) 500 MG tablet 90  tablet 1    Sig: Take 1 tablet (500 mg total) by mouth daily with breakfast.    No follow-ups on file.  Concetta Dee, MD, FACP

## 2024-01-12 NOTE — Patient Instructions (Signed)
 Prediabetes: Plan de alimentacin Prediabetes: Eating Plan La prediabetes es cuando los niveles de azcar en la sangre, tambin llamada glucosa, son ms altos de lo normal. Esto puede ponerlo en riesgo de desarrollar diabetes tipo 2. Cuando tiene prediabetes, hacer cambios saludables puede ayudar a evitar que desarrolle diabetes. Esto incluye hacer cambios en la dieta. Trabaje con su mdico o con un experto en alimentacin saludable llamado nutricionista. Ellos pueden ayudarlo a crear un plan de alimentacin saludable. Este plan puede ser de ayuda para: Controlar los niveles de Banker. Mejorar los niveles de [place]. Controlar la presin arterial. Consejos para seguir este plan? Lea las etiquetas de los alimentos Lea las etiquetas de los alimentos envasados para controlar la cantidad de grasa y International aid/development worker que contienen. Evite los alimentos que contengan lo siguiente: Grasas saturadas. Grasas trans. Azcares agregados. Revise la cantidad de sal (sodio) en las etiquetas de los alimentos. Evite los alimentos que contengan ms de 300 miligramos (mg) de sal por porcin. Limite el consumo de sal a menos de 2,300 mg por da. Al ir de compras Evite comprar alimentos procesados y preelaborados. Evite comprar bebidas con azcar agregada. Al cocinar Cocine con aceite de oliva. No use: Mantequilla. Manteca de cerdo. Mantequilla clarificada. Cocine los alimentos al horno, a la parrilla, asados, al vapor o hervidos. Evite frerlos. Planificacin de las comidas  Trabaje con el nutricionista para crear un plan de alimentacin que sea adecuado para usted. Esto puede incluir el seguimiento de cuntas caloras ingiere al da. Use un registro de alimentos, un cuaderno o una aplicacin mvil para anotar lo que comi en cada comida. Considere la posibilidad de seguir Clinical cytogeneticist. Esto incluye: Comer muchas porciones de frutas y verduras frescas por Futures trader. Pescado al [employer] veces por  semana. Comer una porcin de cereales integrales, frijoles, frutos secos y semillas por da. Aceite de Location manager de otras grasas. Limitar el consumo de alcohol. Limitar la carne roja. Usar productos lcteos descremados o con bajo contenido de [place]. Considere seguir Neomia Dear dieta a base de vegetales. Esto significa comer principalmente: Verduras y frutas. Granos. Frijoles. Frutos secos y semillas. Si tiene hipertensin arterial, quizs deba limitar el consumo de sal o seguir una dieta como el plan de alimentacin de Enfoques Alimentarios para Detener la Hipertensin (Dietary Approaches to Stop Hypertension, DASH). El plan de alimentacin DASH puede ayudar a disminuir la presin arterial alta. Estilo de vida Establezca metas para bajar de peso con la ayuda de su equipo de atencin mdica. Bajar el 7 % del peso corporal es un buen objetivo para la mayora de las personas con prediabetes. Haga al menos 30 minutos de ejercicio, 5 o ms das a la semana. Para obtener apoyo, piense en unirse a un grupo de apoyo o Heritage manager con un consejero de salud mental. Tome los medicamentos nicamente como se lo hayan indicado. Qu alimentos se recomiendan? Nils Pyle Bayas. Bananas. Manzanas. Naranjas. Uvas. Papaya. Mango. Granada. Kiwi. Pomelo. Cerezas. Hoover Brunette Deatra James. Espinaca. Guisantes. Remolachas. Coliflor. Repollo. Brcoli. Zanahorias. Tomates. Calabaza. Christella Noa. Hierbas. Pimientos. Cebollas. Pepinos. Coles de Bruselas. Granos Cereales integrales, como pan o pasta de trigo integral o de otros cereales integrales. Avena sin azcar. Trigo burgol. Cebada. Quinua. Arroz integral. Tacos o tortillas de harina de maz o de salvado. Carnes y otras protenas Frutos de mar. Carne de ave sin piel. Cortes magros de cerdo y carne de res. Tofu. Huevos. Frutos secos. Frijoles. Lcteos Productos lcteos descremados o semidescremados, como yogur, queso cottage y [place]. Bebidas  Agua. T. Caf. Gaseosas sin azcar o  dietticas. Soda. Leche descremada o con bajo contenido de [place]iqu[place]. Productos alternativos a la [place], como [place] de soja o de [place]. Grasas y aceites Aceite de [place]. Aceite de canola. Aceite de girasol. Aceite de semillas de uva. Aguacate. Nueces. Dulces y postres Pudin sin azcar o con bajo contenido de [place]. Helado y otros postres congelados sin azcar o con bajo contenido de [place]. Alios y condimentos Hierbas. Especias sin sal. Mostaza. Salsa de pepinillos. Ktchup con bajo contenido de sal y de International aid/development worker. Salsa barbacoa con bajo contenido de sal y de azcar. Mayonesa con bajo contenido de grasa o sin grasa. Es posible que los productos que se enumeran ms arriba no sean todos los alimentos y las bebidas que puede consumir. Consulte a su nutricionista para obtener ms informacin. Qu alimentos no se recomiendan? Nils Pyle Frutas enlatadas al almbar. Verduras Verduras enlatadas. Verduras congeladas con mantequilla o salsa de crema. Granos Productos elaborados con [place] y [place], como panes, pastas, bocadillos y cereales. Carnes y [address] de carne con alto contenido de Holiday representative. Carne de ave con piel. Carne empanizada o frita. Carnes procesadas. Lcteos Yogur, queso o [employer]. Bebidas Bebidas azucaradas, como t helado y [place]. Grasas y [employer]. Manteca de cerdo. Mantequilla clarificada. Dulces y [employer], como pasteles, [place], [place], galletas dulces y tarta de [place]. Alios y condimentos Mezclas de especias con sal agregada. Ktchup. Salsa barbacoa. Mayonesa. Es posible que los productos que se enumeran ms arriba no sean todos los alimentos y las bebidas que Personnel officer. Consulte a su nutricionista para obtener ms informacin. Dnde obtener ms informacin American Diabetes Association (Asociacin Estadounidense de la Diabetes): diabetes.org/food-nutrition Esta informacin no tiene Theme park manager  el consejo del mdico. Asegrese de hacerle al mdico cualquier pregunta que tenga. Document Revised: 04/17/2023 Document Reviewed: 04/17/2023 Elsevier Patient Education  2024 [employer].

## 2024-01-13 ENCOUNTER — Other Ambulatory Visit: Payer: Self-pay

## 2024-01-13 ENCOUNTER — Ambulatory Visit: Payer: Self-pay | Admitting: Internal Medicine

## 2024-01-13 LAB — COMPREHENSIVE METABOLIC PANEL WITH GFR
ALT: 35 IU/L — ABNORMAL HIGH (ref 0–32)
AST: 25 IU/L (ref 0–40)
Albumin: 4.2 g/dL (ref 3.9–4.9)
Alkaline Phosphatase: 77 IU/L (ref 44–121)
BUN/Creatinine Ratio: 11 (ref 9–23)
BUN: 8 mg/dL (ref 6–24)
Bilirubin Total: 0.2 mg/dL (ref 0.0–1.2)
CO2: 19 mmol/L — ABNORMAL LOW (ref 20–29)
Calcium: 9.3 mg/dL (ref 8.7–10.2)
Chloride: 102 mmol/L (ref 96–106)
Creatinine, Ser: 0.72 mg/dL (ref 0.57–1.00)
Globulin, Total: 3.3 g/dL (ref 1.5–4.5)
Glucose: 116 mg/dL — ABNORMAL HIGH (ref 70–99)
Potassium: 4.2 mmol/L (ref 3.5–5.2)
Sodium: 138 mmol/L (ref 134–144)
Total Protein: 7.5 g/dL (ref 6.0–8.5)
eGFR: 105 mL/min/{1.73_m2} (ref >59)

## 2024-01-13 LAB — LIPID PANEL
Chol/HDL Ratio: 8.1 ratio — ABNORMAL HIGH (ref 0.0–4.4)
Cholesterol, Total: 210 mg/dL — ABNORMAL HIGH (ref 100–199)
HDL: 26 mg/dL — ABNORMAL LOW (ref >39)
LDL Chol Calc (NIH): 98 mg/dL (ref 0–99)
Triglycerides: 513 mg/dL — ABNORMAL HIGH (ref 0–149)
VLDL Cholesterol Cal: 86 mg/dL — ABNORMAL HIGH (ref 5–40)

## 2024-01-13 LAB — CBC
Hematocrit: 33 % — ABNORMAL LOW (ref 34.0–46.6)
Hemoglobin: 10.8 g/dL — ABNORMAL LOW (ref 11.1–15.9)
MCH: 30.4 pg (ref 26.6–33.0)
MCHC: 32.7 g/dL (ref 31.5–35.7)
MCV: 93 fL (ref 79–97)
Platelets: 359 10*3/uL (ref 150–450)
RBC: 3.55 x10E6/uL — ABNORMAL LOW (ref 3.77–5.28)
RDW: 13.2 % (ref 11.7–15.4)
WBC: 9.3 10*3/uL (ref 3.4–10.8)

## 2024-01-13 MED ORDER — FENOFIBRATE 48 MG PO TABS
48.0000 mg | ORAL_TABLET | Freq: Every day | ORAL | 3 refills | Status: DC
  Filled 2024-01-13: qty 30, 30d supply, fill #0

## 2024-01-13 MED ORDER — FERROUS SULFATE 325 (65 FE) MG PO TABS
325.0000 mg | ORAL_TABLET | Freq: Every day | ORAL | 0 refills | Status: DC
  Filled 2024-01-13: qty 120, 120d supply, fill #0

## 2024-01-15 ENCOUNTER — Other Ambulatory Visit: Payer: Self-pay

## 2024-01-15 DIAGNOSIS — N6310 Unspecified lump in the right breast, unspecified quadrant: Secondary | ICD-10-CM

## 2024-01-22 ENCOUNTER — Other Ambulatory Visit: Payer: Self-pay

## 2024-01-22 ENCOUNTER — Ambulatory Visit: Payer: Self-pay

## 2024-01-22 VITALS — BP 124/86 | Wt 156.6 lb

## 2024-01-22 DIAGNOSIS — N6315 Unspecified lump in the right breast, overlapping quadrants: Secondary | ICD-10-CM

## 2024-01-22 DIAGNOSIS — Z01419 Encounter for gynecological examination (general) (routine) without abnormal findings: Secondary | ICD-10-CM

## 2024-01-22 NOTE — Patient Instructions (Signed)
 Explained breast self awareness with Arlan Labella. Pap smear completed today. Let patient know if today's Pap smear is normal and HPV negative that her next Pap smear will be due in one year. Referred patient to the [facility] for a diagnostic mammogram. Appointment scheduled Tuesday, February 03, 2024 at 1010. Patient aware of appointment and will be there. Let patient know will follow up with her within the next couple weeks with results of Pap smear by letter or phone. Arlan Labella verbalized understanding.  Chatara Lucente, Dela Favor, RN 11:03 AM

## 2024-01-22 NOTE — Progress Notes (Signed)
 [name] is a 48 y.o. [phone] female who presents to [facility] clinic today with complaint of right breast lump x 3 months that is painful to the touch. Patient rates the pain at a 5 out of 10.    Pap Smear: Pap smear completed today. 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 Last four Pap smear and two colposcopy results are in Epic.   Physical exam: Breasts Breasts symmetrical. No skin abnormalities bilateral breasts. No nipple retraction bilateral breasts. No nipple discharge bilateral breasts. No lymphadenopathy. No lumps palpated left breast. Palpated a mobile lump within the right breast between 11 o'clock and 4 o'clock that measured 5 cm x 7 cm. Complaints of tenderness when palpated right breast lump on exam.  MS 3D DIAG MAMMO UNI LT BR (aka MM) Result Date: 04/17/2023 CLINICAL DATA:  Screening recall for a left breast asymmetry. EXAM: DIGITAL DIAGNOSTIC UNILATERAL LEFT MAMMOGRAM WITH TOMOSYNTHESIS AND CAD TECHNIQUE: Left digital diagnostic mammography and breast tomosynthesis was performed. The images were evaluated with computer-aided detection. COMPARISON:  Previous exam(s). ACR Breast Density Category d: The breasts are extremely dense, which lowers the sensitivity of mammography. FINDINGS: Spot compression tomosynthesis images over the asymmetry in the far superior posterior left breast demonstrates resolution of the area of concern. Normal fibroglandular tissue is noted. IMPRESSION: Resolution of the left breast asymmetry consistent with overlapping fibroglandular tissue.  RECOMMENDATION: Screening mammogram in one year.(Code:SM-B-01Y) I have discussed the findings and recommendations with the patient. If applicable, a reminder letter will be sent to the patient regarding the next appointment. BI-RADS CATEGORY  1: Negative. Electronically Signed   By: Alinda Apley M.D.   On: 04/17/2023 14:27   MS 3D SCR MAMMO BILAT BR (aka MM) Result Date: 03/28/2023 CLINICAL DATA:  Screening. EXAM: DIGITAL SCREENING BILATERAL MAMMOGRAM WITH TOMOSYNTHESIS AND CAD TECHNIQUE: Bilateral screening digital craniocaudal and mediolateral oblique mammograms were obtained. Bilateral screening digital breast tomosynthesis was performed. The images were evaluated with computer-aided detection. COMPARISON:  Previous exam(s). ACR Breast Density Category d: The breasts are extremely dense, which lowers the sensitivity of mammography. FINDINGS: In the left breast, a possible asymmetry warrants further evaluation. In the right breast, no findings suspicious for malignancy. IMPRESSION: Further evaluation is suggested for possible asymmetry in the left breast. RECOMMENDATION: Diagnostic mammogram and possibly ultrasound of the left breast. (Code:FI-L-80M) The patient will be contacted regarding the findings, and additional imaging will be scheduled. BI-RADS CATEGORY  0: Incomplete: Need additional imaging evaluation. Electronically Signed   By: Mercie Stalker M.D.   On: 03/28/2023 17:42   MS DIGITAL DIAG TOMO BILAT Result Date: 09/22/2020 CLINICAL DATA:  48 year old female presenting with a new palpable area of concern felt by the patient's physician in the lower outer right breast. EXAM: DIGITAL DIAGNOSTIC BILATERAL MAMMOGRAM WITH CAD ULTRASOUND RIGHT BREAST TECHNIQUE: Bilateral digital diagnostic mammography was performed. Digital images of the breasts were evaluated with computer-aided detection. Targeted ultrasound examination of the Right breast was performed COMPARISON:  Previous exam(s). ACR Breast  Density Category d: The breast tissue is extremely dense, which lowers the sensitivity of mammography. FINDINGS:  Mammogram: Right breast: A skin BB marks the site of concern reported by the patient's physician in the lower outer right breast. A spot tangential view of this area is were performed in addition to standard views. There is an oval circumscribed mass at the palpable site measuring approximately 2.3 cm. Left breast: No suspicious mass, distortion, or microcalcifications are identified to suggest presence of malignancy. There are multiple bilateral fluctuating circumscribed oval masses, consistent with history of cysts. On physical exam, I feel a discrete mass at the site of concern in the lower outer right breast. Ultrasound: Targeted ultrasound is performed at the palpable site of concern in the right breast at 8 o'clock 3 cm from the nipple demonstrating an oval circumscribed anechoic mass with posterior enhancement measuring 2.3 x 1.7 x 1.7 cm, consistent with a benign simple cyst. This corresponds to the mammographic abnormality. IMPRESSION: 1. At the palpable site of concern in the right breast at 8 o'clock there is a benign simple cyst. 2.  No mammographic evidence of malignancy in the bilateral breasts. RECOMMENDATION: Screening mammogram in one year.(Code:SM-B-01Y) I have discussed the findings and recommendations with the patient. If applicable, a reminder letter will be sent to the patient regarding the next appointment. BI-RADS CATEGORY  2: Benign. Electronically Signed   By: Allena Ito M.D.   On: 09/22/2020 14:53   MS DIGITAL DIAG TOMO BILAT Result Date: 05/20/2019 CLINICAL DATA:  Patient presents with a new palpable abnormality in the inner left breast. EXAM: DIGITAL DIAGNOSTIC BILATERAL MAMMOGRAM WITH CAD AND TOMO ULTRASOUND LEFT BREAST COMPARISON:  Previous exam(s). ACR Breast Density Category d: The breast tissue is extremely dense, which lowers the sensitivity of mammography.  FINDINGS: Mammogram: No suspicious mass, distortion, or microcalcifications are identified to suggest presence of malignancy. Additional spot compression tomosynthesis views were performed at the area of palpable concern in her left breast demonstrating no discrete abnormality. Mammographic images were processed with CAD. On physical exam, there is a small focus of erythema on the skin in the inner left breast at the palpable site of concern. Targeted ultrasound is performed demonstrating an oval hypoechoic mass measuring 0.6 x 0.2 x 0.6 cm which is located within the skin at 830 o'clock 5 cm from the nipple in the left breast. This corresponds to the palpable site of concern. IMPRESSION: Left breast mass at 8:30 o'clock measuring 0.6 cm is located within the skin and likely represents an inflamed sebaceous cyst. No mammographic or sonographic evidence of malignancy. RECOMMENDATION: Conservative treatment with warm compresses is recommended for the probable inflamed sebaceous cyst. The patient was counseled to return should the skin redness or pain worsen. Screening mammogram in one year.(Code:SM-B-01Y) I have discussed the findings and recommendations with the patient. If applicable, a reminder letter will be sent to the patient regarding the next appointment. BI-RADS CATEGORY  2: Benign. Electronically Signed   By: Allena Ito M.D.   On: 05/20/2019 15:10    Pelvic/Bimanual Ext Genitalia No lesions, no swelling and no discharge observed on external genitalia.        Vagina Vagina pink and normal texture. No lesions or discharge observed in vagina.        Cervix Cervix is present. Cervix pink and of normal texture. No discharge observed.    Uterus Uterus is present and palpable. Uterus in normal position and normal size.        Adnexae Bilateral ovaries present and palpable. No tenderness on palpation.  Rectovaginal No rectal exam completed today since patient had no rectal  complaints. No skin abnormalities observed on exam.     Smoking History: Patient has never smoked.   Patient Navigation: Patient education provided. Access to services provided for patient through [facility] program. Spanish interpreter Cheryl Espinoza from [facility] provided.   Colorectal Cancer Screening: Per patient has never had colonoscopy completed. FIT Test completed 07/14/2023 that was negative. No complaints today.    Breast and Cervical Cancer Risk Assessment: Patient does not have family history of breast cancer, known genetic mutations, or radiation treatment to the chest before age 18. Patient has history of cervical dysplasia. Patient has no history of being immunocompromised or DES exposure in-utero.  Risk Scores as of Encounter on 01/22/2024     Gregary Lean           5-year 0.54%   Lifetime 6.4%   This patient is Hisp[place]/Latina but has no documented birth country, so the [place] model used data from [place] patients to calculate their risk score. Document a birth country in the Demographics activity for a more accurate score.         Last calculated by Rudy Costain, CMA on 01/22/2024 at 10:22 AM        A: [facility] exam with pap smear Complaint of right breast lump and pain.  P: Referred patient to the [facility] for a diagnostic mammogram. Appointment scheduled Tuesday, February 03, 2024 at 1010.  Stefan Edge, RN 01/22/2024 11:03 AM

## 2024-01-27 ENCOUNTER — Telehealth: Payer: Self-pay

## 2024-01-27 LAB — CYTOLOGY - PAP
Comment: NEGATIVE
High risk HPV: NEGATIVE

## 2024-01-27 NOTE — Telephone Encounter (Signed)
 Called patient via [employer] [phone] to give pap smear results. Informed patient that pap showed LSIL and neative HPV. Based on this result and her previous abnormal pap smear a colpo is recommended for FU. Patient voiced understanding.  Patient is scheduled with B[facility]CP for colpo on 02/17/24 @ 10:15 am.

## 2024-01-27 NOTE — Telephone Encounter (Signed)
 Attempted to contact patient via [employer] [phone] to go over pap results. Left name and number for patient to call back.

## 2024-02-03 ENCOUNTER — Ambulatory Visit
Admission: RE | Admit: 2024-02-03 | Discharge: 2024-02-03 | Disposition: A | Discharge status: Home / Self Care | Payer: Self-pay | Source: Ambulatory Visit | Attending: Obstetrics and Gynecology | Admitting: Obstetrics and Gynecology

## 2024-02-03 DIAGNOSIS — N6310 Unspecified lump in the right breast, unspecified quadrant: Secondary | ICD-10-CM

## 2024-02-17 ENCOUNTER — Other Ambulatory Visit (HOSPITAL_COMMUNITY)
Admission: RE | Admit: 2024-02-17 | Discharge: 2024-02-17 | Disposition: A | Discharge status: Home / Self Care | Payer: Self-pay | Source: Ambulatory Visit | Attending: Obstetrics and Gynecology | Admitting: Obstetrics and Gynecology

## 2024-02-17 ENCOUNTER — Ambulatory Visit: Payer: Self-pay | Admitting: Hematology and Oncology

## 2024-02-17 VITALS — BP 122/87 | Wt 152.0 lb

## 2024-02-17 DIAGNOSIS — R87612 Low grade squamous intraepithelial lesion on cytologic smear of cervix (LGSIL): Secondary | ICD-10-CM

## 2024-02-17 DIAGNOSIS — Z01812 Encounter for preprocedural laboratory examination: Secondary | ICD-10-CM

## 2024-02-17 NOTE — Progress Notes (Signed)
 Ms. Guerline Happ is a 48 y.o. female who presents to [facility] clinic today with no complaints.    Pap Smear: Pap not smear completed today. Last Pap smear was 01/22/24 and was abnormal - LSIL. Per patient has history of an abnormal Pap smear. Last Pap smear result is available in Epic.03/10/23 - LSIL/ HPV-; 12/23/2019 - LSIL/ HPV-; 04/15/19 - LSIL/ HPV-   Physical exam: Breasts Breasts symmetrical. No skin abnormalities bilateral breasts. No nipple retraction bilateral breasts. No nipple discharge bilateral breasts. No lymphadenopathy. No lumps palpated bilateral breasts.       Pelvic/Bimanual Pap is not indicated today    Smoking History: Patient has never smoked and was not referred to quit line.    Patient Navigation: Patient education provided. Access to services provided for patient through [facility] program. Bernice Angry interpreter provided. No transportation provided   Colorectal Cancer Screening: Per patient has never had colonoscopy completed No complaints today.    Breast and Cervical Cancer Risk Assessment: Patient does not have family history of breast cancer, known genetic mutations, or radiation treatment to the chest before age 75. Patient does not have history of cervical dysplasia, immunocompromised, or DES exposure in-utero.  Risk Scores as of Encounter on 02/17/2024     Alisa           5-year 0.25%   Lifetime 3.47%            Last calculated by Silas, Ansyi K, CMA on 02/17/2024 at 10:37 AM        A: B[facility]CP exam without pap smear No complaints with benign exam.   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   Harl Eleanor LABOR, NP 02/17/2024 10:56 AM

## 2024-02-19 ENCOUNTER — Other Ambulatory Visit: Payer: Self-pay | Admitting: Obstetrics and Gynecology

## 2024-02-19 DIAGNOSIS — Z1231 Encounter for screening mammogram for malignant neoplasm of breast: Secondary | ICD-10-CM

## 2024-02-19 LAB — SURGICAL PATHOLOGY

## 2024-03-02 ENCOUNTER — Ambulatory Visit: Payer: Self-pay

## 2024-03-02 NOTE — Telephone Encounter (Signed)
 Using [employer], [place]: [id number], I have advised the pt of her benign COLPO results and the recommendation is for her to repeat her PAP in one year. The pt had no questions and expressed understanding of this information.

## 2024-04-12 ENCOUNTER — Ambulatory Visit: Payer: Self-pay | Admitting: *Deleted

## 2024-04-12 NOTE — Telephone Encounter (Signed)
 FYI Only or Action Required?: Action required by provider: request for appointment, update on patient condition, and please advise for earlier appt hx DM .  Patient was last seen in primary care on 01/12/2024 by Vicci Barnie NOVAK, MD.  Called Nurse Triage reporting Pain.  Symptoms began several weeks ago.  Interventions attempted: OTC medications: tylenol not effective.  Symptoms are: gradually worsening.  Triage Disposition: See Physician Within 24 Hours  Patient/caregiver understands and will follow disposition?: No, wishes to speak with PCP               Copied from CRM #[id number]. Topic: Clinical - Red Word Triage >> Apr 12, 2024  1:42 PM Essie A wrote: Red Word that prompted transfer to Nurse Triage: Pain waist area on right side between waist and rib area.  This has been going on for almost 3 weeks.  She's been taking tylenol but if she doesn't take it she can't sleep at night. Reason for Disposition  Diabetes mellitus or weak immune system (e.g., HIV positive, cancer chemo, splenectomy, organ transplant, chronic steroids)  (Exception: Mild pain that is only present with movement.)  Answer Assessment - Initial Assessment Questions Interpreter ID # [id number] used for triage. Recommended appt within 24 hours. None available with PCP until Sept. Patient did not want to see other providers. Recommended if sx worsen go to UC/ED. Please advise if earlier appt can be scheduled. Hx DM unknown blood glucose. Tylenol not effective for pain.         1. LOCATION: Where does it hurt? (e.g., left, right)     Right side pain from between ribs and waist  2. ONSET: When did the pain start?     3 weeks ago  3. SEVERITY: How bad is the pain? (e.g., Scale 1-10; mild, moderate, or severe)     Mild something from inside feels like pulling  4. PATTERN: Does the pain come and go, or is it constant?      Comes and goes worsening when trying to sleep at night laying flat  5.  CAUSE: What do you think is causing the pain?     Not sure  6. OTHER SYMPTOMS:  Do you have any other symptoms? (e.g., fever, abdomen pain, vomiting, leg weakness, burning with urination, blood in urine)     Right flank pain, chills at times, pain worsens laying down in bed and not sleeping  7. PREGNANCY:  Is there any chance you are pregnant? When was your last menstrual period?     na  Protocols used: Flank Pain-A-AH

## 2024-05-05 ENCOUNTER — Telehealth: Payer: Self-pay | Admitting: Internal Medicine

## 2024-05-05 NOTE — Telephone Encounter (Signed)
 Doris Agricultural consultant confirmed appt

## 2024-05-06 ENCOUNTER — Encounter: Payer: Self-pay | Admitting: Internal Medicine

## 2024-05-06 ENCOUNTER — Other Ambulatory Visit: Payer: Self-pay

## 2024-05-06 ENCOUNTER — Ambulatory Visit: Payer: Self-pay | Attending: Internal Medicine | Admitting: Internal Medicine

## 2024-05-06 VITALS — BP 112/75 | HR 85 | Temp 98.4°F | Ht 61.0 in | Wt 157.0 lb

## 2024-05-06 DIAGNOSIS — M79671 Pain in right foot: Secondary | ICD-10-CM

## 2024-05-06 DIAGNOSIS — R87612 Low grade squamous intraepithelial lesion on cytologic smear of cervix (LGSIL): Secondary | ICD-10-CM

## 2024-05-06 DIAGNOSIS — Z23 Encounter for immunization: Secondary | ICD-10-CM

## 2024-05-06 DIAGNOSIS — N6001 Solitary cyst of right breast: Secondary | ICD-10-CM

## 2024-05-06 DIAGNOSIS — D649 Anemia, unspecified: Secondary | ICD-10-CM

## 2024-05-06 DIAGNOSIS — M545 Low back pain, unspecified: Secondary | ICD-10-CM

## 2024-05-06 MED ORDER — DICLOFENAC SODIUM 75 MG PO TBEC
75.0000 mg | DELAYED_RELEASE_TABLET | Freq: Two times a day (BID) | ORAL | 0 refills | Status: DC | PRN
  Filled 2024-05-06: qty 30, 15d supply, fill #0

## 2024-05-06 MED ORDER — FERROUS SULFATE 325 (65 FE) MG PO TABS
325.0000 mg | ORAL_TABLET | Freq: Every day | ORAL | 0 refills | Status: AC
  Filled 2024-05-06: qty 120, 120d supply, fill #0

## 2024-05-06 NOTE — Progress Notes (Signed)
 Patient ID: Cheryl Espinoza, female    DOB: 08-Oct-1975  MRN: [id number]  CC: Follow-up (Follow-up /On-going R waist pain X1 mo/Pain on R heel of foot X2 mo/Yes to flu vax)   Subjective: Cheryl Espinoza is a 48 y.o. female who presents for chronic ds management. Her concerns today include:  Patient with history of prediabetes, anemia, migraine headaches, RT breast cyst, abn pap 2025/neg colpo. Due for repeat pap6/24/25   AM[employer] Language interpreter used during this encounter. #[phone] Christian. Rozann [phone]   Discussed the use of AI scribe software for clinical note transcription with the patient, who gave verbal consent to proceed.  History of Present Illness Cheryl Espinoza is a 48 year old female who presents with right waist pain and right heel pain.  She has been experiencing intermittent right waist pain for the past month, primarily at night, lasting about thirty minutes, and affecting her sleep. The pain is described as pulsating and poking, occurring daily, especially when sitting. She cannot sleep on her right side due to the pain and has to sleep on her left side. No associated nausea, vomiting, diarrhea, constipation, or urinary symptoms. No hematuria other than during menses. She takes Tylenol for relief, which provides minimal relief. Endorses subjective fever  when the pain is severe, but she has not measured the temperature.  She has been experiencing right heel pain for the past two months, which has been ongoing for over three years. She previously saw a podiatrist in January, who told her she had plantar fasciitis, and she received an injection that provided temporary relief. The pain has since returned, causing her to limp when walking. She endorses wearing good support shoes and wears tennis today.  She has history of anemia with last hemoglobin being 10.8.  She is on iron supplement.  On last visit with me, she had complained of right  breast mass.  Dx Mammogram showed this to be a cyst.  She is due for her routine screening mammogram that is scheduled for next month.  She also had colposcopy done for abnormal Pap smear.  Biopsy was negative.  She will need Pap smear again in 1 year.    Patient Active Problem List   Diagnosis Date Noted   Overweight (BMI 25.0-29.9) 02/22/2021   Screening breast examination 05/11/2019   LGSIL on Pap smear of cervix 05/11/2019   Breast lump on right side at 11 o'clock position 05/11/2019   Breast lump on left side at 9 o'clock position 05/11/2019   Low grade squamous intraepith lesion on cytologic smear cervix (lgsil) 09/03/2017   Family history of diabetes mellitus in father 02/25/2017   Anemia 11/28/2016   Pilonidal cyst without abscess 05/03/2016   Vitamin D  deficiency 03/16/2015   Allergic rhinitis 03/15/2015   Tinea pedis 03/15/2015   Thickened endometrium 09/22/2014   Dysmenorrhea 07/14/2014   Tension headache 07/14/2014   GERD (gastroesophageal reflux disease) 07/14/2014     Current Outpatient Medications on File Prior to Visit  Medication Sig Dispense Refill   fenofibrate  (TRICOR ) 48 MG tablet Take 1 tablet (48 mg total) by mouth daily. 30 tablet 3   metFORMIN  (GLUCOPHAGE ) 500 MG tablet Take 1 tablet (500 mg total) by mouth daily with breakfast. 90 tablet 1   Current Facility-Administered Medications on File Prior to Visit  Medication Dose Route Frequency Provider Last Rate Last Admin   triamcinolone  acetonide (KENALOG ) 10 MG/ML injection 10 mg  10 mg Intra-articular Once  No Known Allergies  Social History   Socioeconomic History   Marital status: Single    Spouse name: Not on file   Number of children: 3   Years of education: 9    Highest education level: 9th grade  Occupational History   Occupation: Restaurant Prep   Tobacco Use   Smoking status: Never   Smokeless tobacco: Never  Vaping Use   Vaping status: Never Used  Substance and Sexual  Activity   Alcohol use: No    Alcohol/week: 0.0 standard drinks of alcohol   Drug use: No   Sexual activity: Yes    Birth control/protection: Condom  Other Topics Concern   Not on file  Social History Narrative   Lives at home with daughter.    58 yo daughter.    Two younger children [place] in [place]    Lived in [place]  since [place].    Social Drivers of Corporate investment banker Strain: Not on file  Food Insecurity: No Food Insecurity (05/06/2024)   Hunger Vital Sign    Worried About Running Out of Food in the Last Year: Never true    Ran Out of Food in the Last Year: Never true  Transportation Needs: No Transportation Needs (05/06/2024)   PRAPARE - Administrator, Civil Service (Medical): No    Lack of Transportation (Non-Medical): No  Physical Activity: Insufficiently Active (11/11/2017)   Exercise Vital Sign    Days of Exercise per Week: 2 days    Minutes of Exercise per Session: 10 min  Stress: No Stress Concern Present (11/11/2017)   [employer] of Occupational Health - Occupational Stress Questionnaire    Feeling of Stress : Not at all  Social Connections: Moderately Isolated (11/11/2017)   Social Connection and Isolation Panel    Frequency of Communication with Friends and Family: More than three times a week    Frequency of Social Gatherings with Friends and Family: Once a week    Attends Religious Services: Never    Database administrator or Organizations: No    Attends Banker Meetings: Never    Marital Status: Never married  Intimate Partner Violence: Not At Risk (05/06/2024)   Humiliation, Afraid, Rape, and Kick questionnaire    Fear of Current or Ex-Partner: No    Emotionally Abused: No    Physically Abused: No    Sexually Abused: No    Family History  Problem Relation Age of Onset   Hyperlipidemia Mother    Diabetes Father    Diabetes Sister    Cancer Neg Hx    Heart disease Neg Hx    Breast cancer Neg Hx     Past  Surgical History:  Procedure Laterality Date   CESAREAN SECTION  2000, 2002     ROS: Review of Systems Negative except as stated above  PHYSICAL EXAM: BP 112/75 (BP Location: Left Arm, Patient Position: Sitting, Cuff Size: Normal)   Pulse 85   Temp 98.4 F (36.9 C) (Oral)   Ht 5' 1 (1.549 m)   Wt 157 lb (71.2 kg)   SpO2 99%   BMI 29.66 kg/m   Physical Exam  General appearance - alert, well appearing, and in no distress Mental status - normal mood, behavior, speech, dress, motor activity, and thought processes Chest - clear to auscultation, no wheezes, rales or rhonchi, symmetric air entry Heart - normal rate, regular rhythm, normal S1, S2, no murmurs, rubs, clicks or gallops Abdomen -  soft, nontender, nondistended, no masses or organomegaly. Mild tenderness RT side just below the angle of the rib. No flank tenderness Extremities - peripheral pulses normal, no pedal edema, no clubbing or cyanosis MSK: mild tenderness on palpation of heel RT side     Latest Ref Rng & Units 01/12/2024    2:23 PM 12/10/2021    1:30 PM 02/22/2021   10:57 AM  CMP  Glucose 70 - 99 mg/dL 883  92  92   BUN 6 - 24 mg/dL 8  11  7    Creatinine 0.57 - 1.00 mg/dL 9.27  9.46  9.31   Sodium 134 - 144 mmol/L 138  139  142   Potassium 3.5 - 5.2 mmol/L 4.2  4.5  4.5   Chloride 96 - 106 mmol/L 102  105  101   CO2 20 - 29 mmol/L 19   22   Calcium 8.7 - 10.2 mg/dL 9.3  9.3  9.9   Total Protein 6.0 - 8.5 g/dL 7.5  8.1  7.8   Total Bilirubin 0.0 - 1.2 mg/dL 0.2  0.3  0.4   Alkaline Phos 44 - 121 IU/L 77  71  76   AST 0 - 40 IU/L 25  24  27    ALT 0 - 32 IU/L 35   29    Lipid Panel     Component Value Date/Time   CHOL 210 (H) 01/12/2024 1423   TRIG 513 (H) 01/12/2024 1423   HDL 26 (L) 01/12/2024 1423   CHOLHDL 8.1 (H) 01/12/2024 1423   LDLCALC 98 01/12/2024 1423    CBC    Component Value Date/Time   WBC 9.3 01/12/2024 1423   WBC 9.6 06/08/2020 1646   RBC 3.55 (L) 01/12/2024 1423   RBC 3.86 (L)  06/08/2020 1646   HGB 10.8 (L) 01/12/2024 1423   HCT 33.0 (L) 01/12/2024 1423   PLT 359 01/12/2024 1423   MCV 93 01/12/2024 1423   MCH 30.4 01/12/2024 1423   MCH 26.2 06/08/2020 1646   MCHC 32.7 01/12/2024 1423   MCHC 29.6 (L) 06/08/2020 1646   RDW 13.2 01/12/2024 1423   LYMPHSABS 2.8 05/09/2022 1104   MONOABS 0.6 06/08/2020 1646   EOSABS 0.1 05/09/2022 1104   BASOSABS 0.0 05/09/2022 1104    ASSESSMENT AND PLAN: 1. Acute right-sided low back pain without sciatica (Primary) Pain seems to be musculoskeletal in nature. Especially since it is exaggerated with movement.  We will try her with diclofenac  to use as needed.  Avoid any heavy lifting, pushing or pulling.  Follow-up if no improvement.  2. Pain of right heel Seems to be flare of plantar fasciitis.  Will get her back in with podiatry.  Advised to always wear good supportive shoes which she tells me she does. - Ambulatory referral to Podiatry  3. Low grade squamous intraepithelial lesion on cytologic smear of cervix (LGSIL) Colpo was negative.  Will need repeat Pap smear in May or June of next year.  4. Breast cyst, right Benign.  Will be due for her regular bilateral screening mammogram that is scheduled for October of this year.  5. Chronic anemia - ferrous sulfate  325 (65 FE) MG tablet; Take 1 tablet (325 mg total) by mouth daily with breakfast.  Dispense: 120 tablet; Refill: 0 - CBC - Iron, TIBC and Ferritin Panel  6. Need for influenza vaccination - Flu vaccine trivalent PF, 6mos and older(Flulaval ,Afluria,Fluarix,Fluzone)   Patient was given the opportunity to ask questions.  Patient verbalized  understanding of the plan and was able to repeat key elements of the plan.   This documentation was completed using Paediatric nurse.  Any transcriptional errors are unintentional.  Orders Placed This Encounter  Procedures   Flu vaccine trivalent PF, 6mos and older(Flulaval ,Afluria,Fluarix,Fluzone)   CBC    Iron, TIBC and Ferritin Panel   Ambulatory referral to Podiatry     Requested Prescriptions   Signed Prescriptions Disp Refills   diclofenac  (VOLTAREN ) 75 MG EC tablet 30 tablet 0    Sig: Take 1 tablet (75 mg total) by mouth 2 (two) times daily as needed.   ferrous sulfate  325 (65 FE) MG tablet 120 tablet 0    Sig: Take 1 tablet (325 mg total) by mouth daily with breakfast.    Return in about 4 months (around 09/05/2024).  Barnie Louder, MD, FACP

## 2024-05-06 NOTE — Patient Instructions (Signed)
 VISIT SUMMARY:  Today, you were seen for right waist pain and right heel pain. We discussed your symptoms, and I have provided a treatment plan to help manage your pain and address other health concerns.  YOUR PLAN:  -MUSCULOSKELETAL PAIN OF RIGHT WAIST/UPPER QUADRANT: This type of pain is related to the muscles and bones in your right waist area. It is worsened by movement and tight clothing and partially relieved by Tylenol. I have prescribed diclofenac  to be taken with food as needed for pain relief.  -RIGHT HEEL PAIN DUE TO PLANTAR FASCIITIS: Plantar fasciitis is inflammation of the tissue on the bottom of your foot, causing heel pain. Since your pain has returned and is causing you to limp, I have referred you to a podiatrist for further evaluation and management.  -ANEMIA: Anemia is a condition where you do not have enough healthy red blood cells to carry adequate oxygen to your body's tissues. You are currently managing this with iron supplements. I have ordered lab tests to recheck your anemia and iron levels.  -SOLITARY CYST OF RIGHT BREAST: A solitary cyst is a fluid-filled sac in the breast. You have a follow-up mammogram scheduled for October 21st to monitor this cyst.  INSTRUCTIONS:  Please follow up with the podiatrist for your heel pain as referred. Additionally, make sure to complete the lab tests for anemia and attend your follow-up mammogram on October 21st.

## 2024-05-07 ENCOUNTER — Ambulatory Visit: Payer: Self-pay | Admitting: Internal Medicine

## 2024-05-07 LAB — CBC
Hematocrit: 37.3 % (ref 34.0–46.6)
Hemoglobin: 12 g/dL (ref 11.1–15.9)
MCH: 31 pg (ref 26.6–33.0)
MCHC: 32.2 g/dL (ref 31.5–35.7)
MCV: 96 fL (ref 79–97)
Platelets: 339 x10E3/uL (ref 150–450)
RBC: 3.87 x10E6/uL (ref 3.77–5.28)
RDW: 13.1 % (ref 11.7–15.4)
WBC: 6.9 x10E3/uL (ref 3.4–10.8)

## 2024-05-07 LAB — IRON,TIBC AND FERRITIN PANEL
Ferritin: 18 ng/mL (ref 15–150)
Iron Saturation: 14 % — ABNORMAL LOW (ref 15–55)
Iron: 61 ug/dL (ref 27–159)
Total Iron Binding Capacity: 439 ug/dL (ref 250–450)
UIBC: 378 ug/dL (ref 131–425)

## 2024-05-20 ENCOUNTER — Ambulatory Visit (INDEPENDENT_AMBULATORY_CARE_PROVIDER_SITE_OTHER)

## 2024-05-20 ENCOUNTER — Encounter: Payer: Self-pay | Admitting: Podiatry

## 2024-05-20 ENCOUNTER — Ambulatory Visit (INDEPENDENT_AMBULATORY_CARE_PROVIDER_SITE_OTHER): Payer: Self-pay | Admitting: Podiatry

## 2024-05-20 DIAGNOSIS — G8929 Other chronic pain: Secondary | ICD-10-CM

## 2024-05-20 DIAGNOSIS — M79671 Pain in right foot: Secondary | ICD-10-CM

## 2024-05-21 ENCOUNTER — Ambulatory Visit: Payer: Self-pay | Admitting: Internal Medicine

## 2024-05-21 NOTE — Progress Notes (Signed)
 Subjective:   Patient ID: Cheryl Espinoza, female   DOB: 48 y.o.   MRN: [id number]   HPI Patient presents with a lot of discomfort plantar aspect right heel   ROS      Objective:  Physical Exam  Neurovascular status intact discomfort right plantar fascia     Assessment:  Acute fasciitis right     Plan:  Sterile prep reinjected the plantar fascia 3 mg Kenalog  5 mg Liken applied sterile dressing reappoint as needed

## 2024-05-25 ENCOUNTER — Ambulatory Visit: Payer: Self-pay

## 2024-05-31 ENCOUNTER — Ambulatory Visit: Admitting: Podiatry

## 2024-06-15 ENCOUNTER — Ambulatory Visit: Payer: Self-pay | Admitting: *Deleted

## 2024-06-15 ENCOUNTER — Ambulatory Visit
Admission: RE | Admit: 2024-06-15 | Discharge: 2024-06-15 | Disposition: A | Payer: Self-pay | Source: Ambulatory Visit | Attending: Obstetrics and Gynecology | Admitting: Obstetrics and Gynecology

## 2024-06-15 VITALS — BP 112/78 | Wt 161.5 lb

## 2024-06-15 DIAGNOSIS — Z1239 Encounter for other screening for malignant neoplasm of breast: Secondary | ICD-10-CM

## 2024-06-15 DIAGNOSIS — Z1231 Encounter for screening mammogram for malignant neoplasm of breast: Secondary | ICD-10-CM

## 2024-06-15 DIAGNOSIS — Z1211 Encounter for screening for malignant neoplasm of colon: Secondary | ICD-10-CM

## 2024-06-15 NOTE — Progress Notes (Addendum)
 [name] is a 48 y.o. female who presents to [facility] clinic today with no complaints.    Pap Smear: Pap smear not completed today. Last Pap smear was 01/22/2024 at [facility] clinic and was abnormal - LSIL with negative HPV that a colposcopy was completed 02/17/2024 that was benign. Patient has a history of three other abnormal Pap smears 03/10/2023 that was LSIL with negative HPV that a follow up Pap smear was recommended, 04/22/2019 that was LSIL with negative HPV that a colposcopy was completed for follow-up 06/17/2019 that showed CIN-I and 08/29/2017 that was LSIL with positive HPV that a colposcopy was completed 11/24/2017 that was benign. Last four Pap smear and two colposcopy results are in Epic.    Physical exam: Breasts Breasts symmetrical. No skin abnormalities bilateral breasts. No nipple retraction bilateral breasts. No nipple discharge bilateral breasts. No lymphadenopathy. No lumps palpated left breast. Palpated a mobile lump within the right breast between 11 o'clock and 4 o'clock that measured 5 cm x 7 cm consistent with previous exam 01/22/2024. No complaints of pain or tenderness on exam.  MS 3D DIAG MAMMO UNI RT BR (aka MM) Result Date: 02/03/2024 CLINICAL DATA:  48 year old woman with palpable abnormality of the RIGHT inner breasts for 3 months. EXAM: DIGITAL DIAGNOSTIC UNILATERAL RIGHT MAMMOGRAM WITH TOMOSYNTHESIS AND CAD; ULTRASOUND RIGHT BREAST LIMITED TECHNIQUE: Right digital diagnostic mammography and breast tomosynthesis was performed. The images were evaluated with computer-aided detection. ; Targeted ultrasound examination of the right breast was performed COMPARISON:  Previous exam(s). ACR Breast Density Category d: The breasts are extremely dense, which lowers the sensitivity of mammography. FINDINGS: RIGHT: Mammogram: Full field CC and MLO and spot compression tangential views of the palpable region of the RIGHT breast demonstrates an oval obscured mass corresponding  to the palpable region of the inner RIGHT breast. Physical examination: Focused physical examination of the palpable region in the inner RIGHT breast demonstrates a soft compressible mobile mass. Ultrasound: Targeted sonographic evaluation of the palpable region of the RIGHT breast demonstrates an oval circumscribed anechoic mass with through transmission measuring 5.1 x 2.2 x 3.4 cm at 2 o'clock 2 CMFN, corresponding to the mammographic mass. This is consistent with a benign cyst. IMPRESSION: Benign 5.1 cm cyst of the RIGHT breast corresponds to the mammographic mass and palpable abnormality. RECOMMENDATION: BILATERAL screening mammogram August 2025. I have discussed the findings and recommendations with the patient. If applicable, a reminder letter will be sent to the patient regarding the next appointment. BI-RADS CATEGORY  2: Benign. Electronically Signed   By: Aliene Lloyd M.D.   On: 02/03/2024 10:55   MS 3D DIAG MAMMO UNI LT BR (aka MM) Result Date: 04/17/2023 CLINICAL DATA:  Screening recall for a left breast asymmetry. EXAM: DIGITAL DIAGNOSTIC UNILATERAL LEFT MAMMOGRAM WITH TOMOSYNTHESIS AND CAD TECHNIQUE: Left digital diagnostic mammography and breast tomosynthesis was performed. The images were evaluated with computer-aided detection. COMPARISON:  Previous exam(s). ACR Breast Density Category d: The breasts are extremely dense, which lowers the sensitivity of mammography. FINDINGS: Spot compression tomosynthesis images over the asymmetry in the far superior posterior left breast demonstrates resolution of the area of concern. Normal fibroglandular tissue is noted. IMPRESSION: Resolution of the left breast asymmetry consistent with overlapping fibroglandular tissue. RECOMMENDATION: Screening mammogram in one year.(Code:SM-B-01Y) I have discussed the findings and recommendations with the patient. If applicable, a reminder letter will be sent to the patient regarding the next appointment. BI-RADS CATEGORY   1: Negative. Electronically Signed   By:  Rosaline Collet M.D.   On: 04/17/2023 14:27   MS 3D SCR MAMMO BILAT BR (aka MM) Result Date: 03/28/2023 CLINICAL DATA:  Screening. EXAM: DIGITAL SCREENING BILATERAL MAMMOGRAM WITH TOMOSYNTHESIS AND CAD TECHNIQUE: Bilateral screening digital craniocaudal and mediolateral oblique mammograms were obtained. Bilateral screening digital breast tomosynthesis was performed. The images were evaluated with computer-aided detection. COMPARISON:  Previous exam(s). ACR Breast Density Category d: The breasts are extremely dense, which lowers the sensitivity of mammography. FINDINGS: In the left breast, a possible asymmetry warrants further evaluation. In the right breast, no findings suspicious for malignancy. IMPRESSION: Further evaluation is suggested for possible asymmetry in the left breast. RECOMMENDATION: Diagnostic mammogram and possibly ultrasound of the left breast. (Code:FI-L-68M) The patient will be contacted regarding the findings, and additional imaging will be scheduled. BI-RADS CATEGORY  0: Incomplete: Need additional imaging evaluation. Electronically Signed   By: Leita Mattocks M.D.   On: 03/28/2023 17:42   MS DIGITAL DIAG TOMO BILAT Result Date: 09/22/2020 CLINICAL DATA:  48 year old female presenting with a new palpable area of concern felt by the patient's physician in the lower outer right breast. EXAM: DIGITAL DIAGNOSTIC BILATERAL MAMMOGRAM WITH CAD ULTRASOUND RIGHT BREAST TECHNIQUE: Bilateral digital diagnostic mammography was performed. Digital images of the breasts were evaluated with computer-aided detection. Targeted ultrasound examination of the Right breast was performed COMPARISON:  Previous exam(s). ACR Breast Density Category d: The breast tissue is extremely dense, which lowers the sensitivity of mammography. FINDINGS: Mammogram: Right breast: A skin BB marks the site of concern reported by the patient's physician in the lower outer right breast. A  spot tangential view of this area is were performed in addition to standard views. There is an oval circumscribed mass at the palpable site measuring approximately 2.3 cm. Left breast: No suspicious mass, distortion, or microcalcifications are identified to suggest presence of malignancy. There are multiple bilateral fluctuating circumscribed oval masses, consistent with history of cysts. On physical exam, I feel a discrete mass at the site of concern in the lower outer right breast. Ultrasound: Targeted ultrasound is performed at the palpable site of concern in the right breast at 8 o'clock 3 cm from the nipple demonstrating an oval circumscribed anechoic mass with posterior enhancement measuring 2.3 x 1.7 x 1.7 cm, consistent with a benign simple cyst. This corresponds to the mammographic abnormality. IMPRESSION: 1. At the palpable site of concern in the right breast at 8 o'clock there is a benign simple cyst. 2.  No mammographic evidence of malignancy in the bilateral breasts. RECOMMENDATION: Screening mammogram in one year.(Code:SM-B-01Y) I have discussed the findings and recommendations with the patient. If applicable, a reminder letter will be sent to the patient regarding the next appointment. BI-RADS CATEGORY  2: Benign. Electronically Signed   By: Inocente Ast M.D.   On: 09/22/2020 14:53    Pelvic/Bimanual Pap is not indicated today per BCCCP guidelines.   Smoking History: Patient has never smoked.  Patient Navigation: Patient education provided. Access to services provided for patient through [facility] program. Spanish interpreter Bernice Dolin from [facility] provided.   Colorectal Cancer Screening: Per patient has never had colonoscopy completed. FIT Test completed 07/14/2023 that was negative. FIT Test given to patient today to complete. No complaints today.     Breast and Cervical Cancer Risk Assessment: Patient does not have family history of breast cancer, known genetic mutations, or  radiation treatment to the chest before age 62. Patient does not have history of cervical dysplasia, immunocompromised, or DES exposure  in-utero.  Risk Scores as of Encounter on 06/15/2024     Alisa           5-year 0.26%   Lifetime 3.42%            Last calculated by Logan Lyle BRAVO, CMA on 06/15/2024 at  9:45 AM        A: BCCCP exam without pap smear No complaints.  P: Referred patient to the [facility] for a screening mammogram on mobile unit. Appointment scheduled Tuesday, June 15, 2024 at 1030.  Driscilla Wanda SQUIBB, RN 06/15/2024 9:52 AM

## 2024-06-15 NOTE — Patient Instructions (Addendum)
 Explained breast self awareness with Cheryl Espinoza. Patient did not need a Pap smear today due to last Pap smear was 01/22/2024. Let her know that due to her last Pap smear being abnormal that a follow up Pap smear is recommended in one year. Referred patient to the [facility] of [facility] for a screening mammogram on mobile unit. Appointment scheduled Tuesday, June 15, 2024 at 1030. Patient aware of appointment and will be there. Let patient know the Breast Center will follow up with her within the next couple weeks with results of her mammogram by letter or phone. Cheryl Espinoza verbalized understanding.  [name], [name], RN 9:52 AM

## 2024-06-18 ENCOUNTER — Ambulatory Visit: Payer: Self-pay

## 2024-06-18 LAB — FECAL OCCULT BLOOD, IMMUNOCHEMICAL: Fecal Occult Bld: NEGATIVE

## 2024-06-21 ENCOUNTER — Ambulatory Visit: Payer: Self-pay

## 2024-06-21 NOTE — Telephone Encounter (Signed)
 FYI Only or Action Required?: FYI only for provider.  Patient was last seen in primary care on 05/06/2024 by Vicci Barnie NOVAK, MD.  Called Nurse Triage reporting Abdominal Pain.  Symptoms began about a month ago.  Interventions attempted: Prescription medications: completed and Rest, hydration, or home remedies.  Symptoms are: gradually worsening.  Triage Disposition: See HCP Within 4 Hours (Or PCP Triage)  Patient/caregiver understands and will follow disposition?:    Copied from CRM [id number]. Topic: Clinical - Red Word Triage >> Jun 21, 2024 10:10 AM Emylou G wrote: [facility] that prompted transfer to Nurse Triage: pain on right side ribs and when she uses the bathroom.SABRA stomach is inflammed.. when she presses her belly button it hurts.. constant going to the bathroom to urinate spanish interpreter: [phone] Reason for Disposition  [1] MILD-MODERATE pain AND [2] constant AND [3] present > 2 hours  Answer Assessment - Initial Assessment Questions Additional info:  1) Evaluated by Dr. Vicci on 05/06/24 for right sided abdomen/back pain, pain is worsening.  2) Patient plans on attending [facility].    1. LOCATION: Where does it hurt?      Right sided and umbilical region 2. RADIATION: Does the pain shoot anywhere else? (e.g., chest, back)     no 3. ONSET: When did the pain begin? (e.g., minutes, hours or days ago)      3 weeks ago  4. SUDDEN: Gradual or sudden onset?     gradual 5. PATTERN Does the pain come and go, or is it constant?     Constant 6. SEVERITY: How bad is the pain?  (e.g., Scale 1-10; mild, moderate, or severe)     Varies, can go up to 9/10,  worse pain when walking  7. RECURRENT SYMPTOM: Have you ever had this type of stomach pain before? If Yes, ask: When was the last time? and What happened that time?      Ongoing for a few weeks but worsening 8. CAUSE: What do you think is causing the stomach pain? (e.g., gallstones, recent  abdominal surgery)     unsure 9. RELIEVING/AGGRAVATING FACTORS: What makes it better or worse? (e.g., antacids, bending or twisting motion, bowel movement)     nothing 10. OTHER SYMPTOMS: Do you have any other symptoms? (e.g., back pain, diarrhea, fever, urination pain, vomiting)       Increased urinary frequency  Protocols used: Abdominal Pain - Female-A-AH

## 2024-06-21 NOTE — Telephone Encounter (Signed)
 Patient was last seen in primary care on 05/06/2024 by Vicci Barnie NOVAK, MD.  Called Nurse Triage reporting No chief complaint on file..  Patient disconnected during transfer, attempted to contact patient twice, No answer, voicemail left requesting return call to clinic.  Interpreter ID, [id number], [name]  Copied from CRM [id number]. Topic: Clinical - Red Word Triage >> Jun 21, 2024  8:28 AM Cheryl Espinoza wrote: Red Word that prompted transfer to Nurse Triage: Patient states she has extreme pain in her abdomen. She states she is having trouble urinating, but did urinate today. She cannot sleep because of the pain. She has a burning sensation, and urgency but unable to fully urinate. This has been ongoing for 2-weeks

## 2024-06-21 NOTE — Telephone Encounter (Signed)
 Unable to reach patient for triage x 3 attempts.  [facility] [phone] utilized to leave a voicemail requesting patient call [facility] and request to speak to a nurse.

## 2024-06-22 ENCOUNTER — Ambulatory Visit: Payer: Self-pay | Attending: Internal Medicine | Admitting: Internal Medicine

## 2024-06-22 ENCOUNTER — Other Ambulatory Visit: Payer: Self-pay

## 2024-06-22 ENCOUNTER — Other Ambulatory Visit (HOSPITAL_COMMUNITY)
Admission: RE | Admit: 2024-06-22 | Discharge: 2024-06-22 | Disposition: A | Discharge status: Home / Self Care | Payer: Self-pay | Source: Ambulatory Visit | Attending: Internal Medicine | Admitting: Internal Medicine

## 2024-06-22 VITALS — BP 133/80 | HR 85 | Temp 97.9°F | Ht 61.0 in | Wt 161.0 lb

## 2024-06-22 DIAGNOSIS — R1013 Epigastric pain: Secondary | ICD-10-CM

## 2024-06-22 DIAGNOSIS — R1011 Right upper quadrant pain: Secondary | ICD-10-CM

## 2024-06-22 DIAGNOSIS — R1024 Suprapubic pain: Secondary | ICD-10-CM | POA: Insufficient documentation

## 2024-06-22 DIAGNOSIS — R300 Dysuria: Secondary | ICD-10-CM

## 2024-06-22 LAB — POCT URINALYSIS DIP (CLINITEK)
Bilirubin, UA: NEGATIVE
Blood, UA: NEGATIVE
Glucose, UA: NEGATIVE mg/dL
Leukocytes, UA: NEGATIVE
Nitrite, UA: NEGATIVE
POC PROTEIN,UA: 30 — AB
Spec Grav, UA: >=1.03 — AB (ref 1.010–1.025)
Urobilinogen, UA: 0.2 U/dL
pH, UA: 5.5 (ref 5.0–8.0)

## 2024-06-22 MED ORDER — SULFAMETHOXAZOLE-TRIMETHOPRIM 800-160 MG PO TABS
1.0000 | ORAL_TABLET | Freq: Two times a day (BID) | ORAL | 0 refills | Status: DC
  Filled 2024-06-22: qty 10, 5d supply, fill #0

## 2024-06-22 MED ORDER — OMEPRAZOLE 20 MG PO CPDR
20.0000 mg | DELAYED_RELEASE_CAPSULE | Freq: Every day | ORAL | 3 refills | Status: AC
  Filled 2024-06-22: qty 30, 30d supply, fill #0

## 2024-06-22 NOTE — Progress Notes (Signed)
 Patient ID: Cheryl Espinoza, female    DOB: August 02, 1976  MRN: [id number]  CC: Dysuria (Burning sensation while urinating, R abdominal pain, swelling, fever, body aches X2 weeks/Already received flu vax. )   Subjective: Cheryl Espinoza is a 48 y.o. female who presents for chronic ds management. Her concerns today include:  Patient with history of prediabetes, anemia, migraine headaches, RT breast cyst, abn pap 2025/neg colpo. Due for repeat pap 6/26   AMN Language interpreter used during this encounter. #Cheryl Espinoza [phone]   Discussed the use of AI scribe software for clinical note transcription with the patient, who gave verbal consent to proceed.  History of Present Illness Cheryl Espinoza is a 48 year old female who presents with urinary symptoms and abdominal pain.  She experiences suprapubic pain when she has to urinate. Pain intensifies with urgency and worsens if urination is delayed. No burning when she urinates (I inquired about this twice).  She denies any abnormal vaginal discharge, nausea or vomiting.  When asked whether she had fever, patient tells me she had chills last night and had a bitter taste in her mouth.  She did not check her temperature.    She describes bilateral lower abdominal pain that persists after urination. There is no abnormal vaginal discharge. Additionally, she experiences abdominal pain related to eating. When her stomach is empty, she does not feel pain, but upon eating, she feels inflammation and pain throughout her stomach especially RUQ. This symptom has also been present for about two weeks. She does not identify any specific foods that exacerbate the pain stating that it occurs with anything she eats. She denies taking any medication for this pain.      Patient Active Problem List   Diagnosis Date Noted   Overweight (BMI 25.0-29.9) 02/22/2021   Screening breast examination 05/11/2019   LGSIL on Pap smear  of cervix 05/11/2019   Breast lump on right side at 11 o'clock position 05/11/2019   Breast lump on left side at 9 o'clock position 05/11/2019   Low grade squamous intraepith lesion on cytologic smear cervix (lgsil) 09/03/2017   Family history of diabetes mellitus in father 02/25/2017   Anemia 11/28/2016   Pilonidal cyst without abscess 05/03/2016   Vitamin D  deficiency 03/16/2015   Allergic rhinitis 03/15/2015   Tinea pedis 03/15/2015   Thickened endometrium 09/22/2014   Dysmenorrhea 07/14/2014   Tension headache 07/14/2014   GERD (gastroesophageal reflux disease) 07/14/2014     Current Outpatient Medications on File Prior to Visit  Medication Sig Dispense Refill   ferrous sulfate  325 (65 FE) MG tablet Take 1 tablet (325 mg total) by mouth daily with breakfast. 120 tablet 0   metFORMIN  (GLUCOPHAGE ) 500 MG tablet Take 1 tablet (500 mg total) by mouth daily with breakfast. 90 tablet 1   Current Facility-Administered Medications on File Prior to Visit  Medication Dose Route Frequency Provider Last Rate Last Admin   triamcinolone  acetonide (KENALOG ) 10 MG/ML injection 10 mg  10 mg Intra-articular Once         No Known Allergies  Social History   Socioeconomic History   Marital status: Single    Spouse name: Not on file   Number of children: 3   Years of education: 9    Highest education level: 9th grade  Occupational History   Occupation: Restaurant Prep   Tobacco Use   Smoking status: Never   Smokeless tobacco: Never  Vaping Use   Vaping  status: Never Used  Substance and Sexual Activity   Alcohol use: No    Alcohol/week: 0.0 standard drinks of alcohol   Drug use: No   Sexual activity: Yes    Birth control/protection: Condom  Other Topics Concern   Not on file  Social History Narrative   Lives at home with daughter.    43 yo daughter.    Two younger children live in [place].    Lived in [place]  since 2004.    Social Drivers of Corporate Investment Banker Strain:  Not on file  Food Insecurity: No Food Insecurity (06/15/2024)   Hunger Vital Sign    Worried About Running Out of Food in the Last Year: Never true    Ran Out of Food in the Last Year: Never true  Transportation Needs: No Transportation Needs (06/15/2024)   PRAPARE - Administrator, Civil Service (Medical): No    Lack of Transportation (Non-Medical): No  Physical Activity: Insufficiently Active (11/11/2017)   Exercise Vital Sign    Days of Exercise per Week: 2 days    Minutes of Exercise per Session: 10 min  Stress: No Stress Concern Present (11/11/2017)   [employer] of Occupational Health - Occupational Stress Questionnaire    Feeling of Stress : Not at all  Social Connections: Moderately Isolated (11/11/2017)   Social Connection and Isolation Panel    Frequency of Communication with Friends and Family: More than three times a week    Frequency of Social Gatherings with Friends and Family: Once a week    Attends Religious Services: Never    Database Administrator or Organizations: No    Attends Banker Meetings: Never    Marital Status: Never married  Intimate Partner Violence: Not At Risk (05/06/2024)   Humiliation, Afraid, Rape, and Kick questionnaire    Fear of Current or Ex-Partner: No    Emotionally Abused: No    Physically Abused: No    Sexually Abused: No    Family History  Problem Relation Age of Onset   Hyperlipidemia Mother    Diabetes Father    Diabetes Sister    Cancer Neg Hx    Heart disease Neg Hx    Breast cancer Neg Hx     Past Surgical History:  Procedure Laterality Date   CESAREAN SECTION  2000, 2002     ROS: Review of Systems Negative except as stated above  PHYSICAL EXAM: BP 133/80 (BP Location: Left Arm, Patient Position: Sitting, Cuff Size: Normal)   Pulse 85   Temp 97.9 F (36.6 C) (Oral)   Ht 5' 1 (1.549 m)   Wt 161 lb (73 kg)   LMP 05/28/2024   SpO2 98%   BMI 30.42 kg/m   Physical Exam  General  appearance - alert, well appearing, and in no distress Mental status - normal mood, behavior, speech, dress, motor activity, and thought processes Abdomen -normal bowel sounds, nondistended, soft, mild suprapubic tenderness without guarding.  Mild right upper quadrant tenderness and epigastric tenderness.  No guarding.  Results for orders placed or performed in visit on 06/22/24  POCT URINALYSIS DIP (CLINITEK)   Collection Time: 06/22/24  5:24 PM  Result Value Ref Range   Color, UA yellow yellow   Clarity, UA clear clear   Glucose, UA negative negative mg/dL   Bilirubin, UA negative negative   Ketones, POC UA trace (5) (A) negative mg/dL   Spec Grav, UA >=8.969 (A) 1.010 - 1.025  Blood, UA negative negative   pH, UA 5.5 5.0 - 8.0   POC PROTEIN,UA =30 (A) negative, trace   Urobilinogen, UA 0.2 0.2 or 1.0 E.U./dL   Nitrite, UA Negative Negative   Leukocytes, UA Negative Negative        Latest Ref Rng & Units 01/12/2024    2:23 PM 12/10/2021    1:30 PM 02/22/2021   10:57 AM  CMP  Glucose 70 - 99 mg/dL 883  92  92   BUN 6 - 24 mg/dL 8  11  7    Creatinine 0.57 - 1.00 mg/dL 9.27  9.46  9.31   Sodium 134 - 144 mmol/L 138  139  142   Potassium 3.5 - 5.2 mmol/L 4.2  4.5  4.5   Chloride 96 - 106 mmol/L 102  105  101   CO2 20 - 29 mmol/L 19   22   Calcium 8.7 - 10.2 mg/dL 9.3  9.3  9.9   Total Protein 6.0 - 8.5 g/dL 7.5  8.1  7.8   Total Bilirubin 0.0 - 1.2 mg/dL 0.2  0.3  0.4   Alkaline Phos 44 - 121 IU/L 77  71  76   AST 0 - 40 IU/L 25  24  27    ALT 0 - 32 IU/L 35   29    Lipid Panel     Component Value Date/Time   CHOL 210 (H) 01/12/2024 1423   TRIG 513 (H) 01/12/2024 1423   HDL 26 (L) 01/12/2024 1423   CHOLHDL 8.1 (H) 01/12/2024 1423   LDLCALC 98 01/12/2024 1423    CBC    Component Value Date/Time   WBC 6.9 05/06/2024 1001   WBC 9.6 06/08/2020 1646   RBC 3.87 05/06/2024 1001   RBC 3.86 (L) 06/08/2020 1646   HGB 12.0 05/06/2024 1001   HCT 37.3 05/06/2024 1001   PLT  339 05/06/2024 1001   MCV 96 05/06/2024 1001   MCH 31.0 05/06/2024 1001   MCH 26.2 06/08/2020 1646   MCHC 32.2 05/06/2024 1001   MCHC 29.6 (L) 06/08/2020 1646   RDW 13.1 05/06/2024 1001   LYMPHSABS 2.8 05/09/2022 1104   MONOABS 0.6 06/08/2020 1646   EOSABS 0.1 05/09/2022 1104   BASOSABS 0.0 05/09/2022 1104    ASSESSMENT AND PLAN: 1. Dysuria (Primary) UA today is negative.  However given her symptoms we will still treat with Bactrim  for 5 days.  Urine culture sent. - POCT URINALYSIS DIP (CLINITEK) - Urine Culture - sulfamethoxazole -trimethoprim  (BACTRIM  DS) 800-160 MG tablet; Take 1 tablet by mouth 2 (two) times daily.  Dispense: 10 tablet; Refill: 0  2. Suprapubic pain - Cervicovaginal ancillary only  3. Right upper quadrant abdominal pain Concern for possible gallbladder disease/stones.  Ultrasound has been ordered. - CBC with Differential/Platelet - Comprehensive metabolic panel with GFR - US  Abdomen Limited RUQ (LIVER/GB); Future  4. Dyspepsia Patient advised to avoid spicy foods.  Will give a trial of omeprazole . - omeprazole  (PRILOSEC) 20 MG capsule; Take 1 capsule (20 mg total) by mouth daily.  Dispense: 30 capsule; Refill: 3  Patient was given the opportunity to ask questions.  Patient verbalized understanding of the plan and was able to repeat key elements of the plan.   This documentation was completed using Paediatric nurse.  Any transcriptional errors are unintentional.  Orders Placed This Encounter  Procedures   Urine Culture   US  Abdomen Limited RUQ (LIVER/GB)   CBC with Differential/Platelet   Comprehensive metabolic panel  with GFR   POCT URINALYSIS DIP (CLINITEK)     Requested Prescriptions   Signed Prescriptions Disp Refills   omeprazole  (PRILOSEC) 20 MG capsule 30 capsule 3    Sig: Take 1 capsule (20 mg total) by mouth daily.   sulfamethoxazole -trimethoprim  (BACTRIM  DS) 800-160 MG tablet 10 tablet 0    Sig: Take 1 tablet by  mouth 2 (two) times daily.    No follow-ups on file.  Barnie Louder, MD, FACP

## 2024-06-23 ENCOUNTER — Ambulatory Visit: Payer: Self-pay | Admitting: Internal Medicine

## 2024-06-23 LAB — CBC WITH DIFFERENTIAL/PLATELET
Basophils Absolute: 0 x10E3/uL (ref 0.0–0.2)
Basos: 0 %
EOS (ABSOLUTE): 0.1 x10E3/uL (ref 0.0–0.4)
Eos: 1 %
Hematocrit: 38.1 % (ref 34.0–46.6)
Hemoglobin: 12.7 g/dL (ref 11.1–15.9)
Immature Grans (Abs): 0 x10E3/uL (ref 0.0–0.1)
Immature Granulocytes: 0 %
Lymphocytes Absolute: 2.9 x10E3/uL (ref 0.7–3.1)
Lymphs: 25 %
MCH: 32.7 pg (ref 26.6–33.0)
MCHC: 33.3 g/dL (ref 31.5–35.7)
MCV: 98 fL — ABNORMAL HIGH (ref 79–97)
Monocytes Absolute: 0.7 x10E3/uL (ref 0.1–0.9)
Monocytes: 6 %
Neutrophils Absolute: 7.6 x10E3/uL — ABNORMAL HIGH (ref 1.4–7.0)
Neutrophils: 68 %
Platelets: 415 x10E3/uL (ref 150–450)
RBC: 3.88 x10E6/uL (ref 3.77–5.28)
RDW: 13.5 % (ref 11.7–15.4)
WBC: 11.3 x10E3/uL — ABNORMAL HIGH (ref 3.4–10.8)

## 2024-06-23 LAB — COMPREHENSIVE METABOLIC PANEL WITH GFR
ALT: 18 IU/L (ref 0–32)
AST: 19 IU/L (ref 0–40)
Albumin: 4.4 g/dL (ref 3.9–4.9)
Alkaline Phosphatase: 63 IU/L (ref 41–116)
BUN/Creatinine Ratio: 22 (ref 9–23)
BUN: 18 mg/dL (ref 6–24)
Bilirubin Total: 0.2 mg/dL (ref 0.0–1.2)
CO2: 22 mmol/L (ref 20–29)
Calcium: 9.6 mg/dL (ref 8.7–10.2)
Chloride: 101 mmol/L (ref 96–106)
Creatinine, Ser: 0.83 mg/dL (ref 0.57–1.00)
Globulin, Total: 3.5 g/dL (ref 1.5–4.5)
Glucose: 114 mg/dL — ABNORMAL HIGH (ref 70–99)
Potassium: 4 mmol/L (ref 3.5–5.2)
Sodium: 138 mmol/L (ref 134–144)
Total Protein: 7.9 g/dL (ref 6.0–8.5)
eGFR: 88 mL/min/1.73 (ref >59)

## 2024-06-24 LAB — URINE CULTURE

## 2024-06-25 LAB — CERVICOVAGINAL ANCILLARY ONLY
Chlamydia: NEGATIVE
Comment: NEGATIVE
Comment: NEGATIVE
Comment: NORMAL
Neisseria Gonorrhea: NEGATIVE
Trichomonas: NEGATIVE

## 2024-07-05 ENCOUNTER — Ambulatory Visit (HOSPITAL_COMMUNITY)
Admission: RE | Admit: 2024-07-05 | Discharge: 2024-07-05 | Disposition: A | Discharge status: Home / Self Care | Payer: Self-pay | Source: Ambulatory Visit | Attending: Internal Medicine | Admitting: Internal Medicine

## 2024-07-05 DIAGNOSIS — R1011 Right upper quadrant pain: Secondary | ICD-10-CM | POA: Insufficient documentation

## 2024-07-08 ENCOUNTER — Other Ambulatory Visit: Payer: Self-pay | Admitting: Internal Medicine

## 2024-07-08 DIAGNOSIS — R1011 Right upper quadrant pain: Secondary | ICD-10-CM

## 2024-07-08 DIAGNOSIS — R1013 Epigastric pain: Secondary | ICD-10-CM

## 2024-09-06 ENCOUNTER — Ambulatory Visit: Payer: Self-pay | Attending: Internal Medicine | Admitting: Internal Medicine

## 2024-09-06 ENCOUNTER — Other Ambulatory Visit: Payer: Self-pay

## 2024-09-06 VITALS — BP 118/77 | HR 86 | Temp 97.9°F | Ht 61.0 in | Wt 161.0 lb

## 2024-09-06 DIAGNOSIS — M25562 Pain in left knee: Secondary | ICD-10-CM

## 2024-09-06 DIAGNOSIS — L309 Dermatitis, unspecified: Secondary | ICD-10-CM

## 2024-09-06 MED ORDER — DICLOFENAC SODIUM 1 % EX GEL
2.0000 g | Freq: Four times a day (QID) | CUTANEOUS | 1 refills | Status: AC
  Filled 2024-09-06: qty 100, 12d supply, fill #0

## 2024-09-06 NOTE — Patient Instructions (Signed)
" °  VISIT SUMMARY: During today's visit, we discussed the rash around your mouth and the pain in your left knee. You also mentioned experiencing excessive saliva production at night. We have developed a plan to address each of these issues.  YOUR PLAN: -FACIAL DERMATITIS: Facial dermatitis is a skin condition that causes a rash, itching, and pain. It may be due to an allergic reaction. You should use over-the-counter hydrocortisone cream 1% daily for one week to help reduce the symptoms.  -LEFT KNEE PAIN: Left knee pain can be caused by inflammation or other issues. You should apply Voltaren  gel two to three times daily as needed for pain relief. The prescription has been sent to the pharmacy downstairs.  INSTRUCTIONS: Please follow up if your symptoms do not improve or if you experience any new symptoms. Use the hydrocortisone cream for one week and the Voltaren  gel as needed. If you have any questions or concerns, do not hesitate to contact our office.                      Contains text generated by Abridge.                                 Contains text generated by Abridge.   "

## 2024-09-06 NOTE — Progress Notes (Signed)
 "   Patient ID: Cheryl Espinoza, female    DOB: [birth year]  MRN: [id number]  CC: Follow-up (Follow-up/Rash around mouth - burning, itching X3 weeks /LT knee pain - feels that there's inflammation/Drooling while sleeping/Discuss mammogram results Wyn received flu vax)   Subjective: Cheryl Espinoza is a 49 y.o. female who presents for chronic ds management. Her chronic medical issues include:  Patient with history of prediabetes, anemia, migraine headaches, RT breast cyst, abn pap 2025/neg colpo. Due for repeat pap 6/26, fatty liver    AMN Language interpreter used during this encounter. #[id number]   Discussed the use of AI scribe software for clinical note transcription with the patient, who gave verbal consent to proceed.  History of Present Illness Cheryl Espinoza is a 49 year old female who presents with a rash around her mouth and left knee pain.  She has had a rash around the left side of her mouth for the past three weeks, which is itchy and painful. No specific triggers or new facial products have been identified, although she mentions contact with a couch where a dog had been. She has been using a minty cream from [employer] and a sandy soap, but does not recall the name of the cream.  She experiences pain on the medial aspect of her left knee for the past two weeks. The pain occurs when bending the knee, such as when she bends down, but not while walking. No swelling or known injury to the knee. She has been using Flanex, an over-the-counter medication from a [employer] store, which provides some relief.  She mentions experiencing excessive drooling on her pillow at nights while a sleep.  HM: wanted me to go over MMG report which was normal.    Patient Active Problem List   Diagnosis Date Noted   Overweight (BMI 25.0-29.9) 02/22/2021   Screening breast examination 05/11/2019   LGSIL on Pap smear of cervix 05/11/2019   Breast lump on right  side at 11 o'clock position 05/11/2019   Breast lump on left side at 9 o'clock position 05/11/2019   Low grade squamous intraepith lesion on cytologic smear cervix (lgsil) 09/03/2017   Family history of diabetes mellitus in father 02/25/2017   Anemia 11/28/2016   Pilonidal cyst without abscess 05/03/2016   Vitamin D  deficiency 03/16/2015   Allergic rhinitis 03/15/2015   Tinea pedis 03/15/2015   Thickened endometrium 09/22/2014   Dysmenorrhea 07/14/2014   Tension headache 07/14/2014   GERD (gastroesophageal reflux disease) 07/14/2014     Medications Ordered Prior to Encounter[1]  Allergies[2]  Social History   Socioeconomic History   Marital status: Single    Spouse name: Not on file   Number of children: 3   Years of education: 9    Highest education level: 9th grade  Occupational History   Occupation: Musician Prep   Tobacco Use   Smoking status: Never   Smokeless tobacco: Never  Vaping Use   Vaping status: Never Used  Substance and Sexual Activity   Alcohol use: No    Alcohol/week: 0.0 standard drinks of alcohol   Drug use: No   Sexual activity: Yes    Birth control/protection: Condom  Other Topics Concern   Not on file  Social History Narrative   Lives at home with daughter.    57 yo daughter.    Two younger children live in [place].    [place]ived in [place]  since 2004.    Social Drivers of Health  Tobacco Use: Low Risk (06/15/2024)   Patient History    Smoking Tobacco Use: Never    Smokeless Tobacco Use: Never    Passive Exposure: Not on file  Financial Resource Strain: Not on file  Food Insecurity: No Food Insecurity (06/15/2024)   Epic    Worried About Programme Researcher, Broadcasting/film/video in the Last Year: Never true    Ran Out of Food in the Last Year: Never true  Transportation Needs: No Transportation Needs (06/15/2024)   Epic    Lack of Transportation (Medical): No    Lack of Transportation (Non-Medical): No  Physical Activity: Not on file  Stress: Not on file   Social Connections: Not on file  Intimate Partner Violence: Not At Risk (05/06/2024)   Epic    Fear of Current or Ex-Partner: No    Emotionally Abused: No    Physically Abused: No    Sexually Abused: No  Depression (PHQ2-9): Low Risk (05/06/2024)   Depression (PHQ2-9)    PHQ-2 Score: 3  Alcohol Screen: Not on file  Housing: Low Risk (05/06/2024)   Epic    Unable to Pay for Housing in the Last Year: No    Number of Times Moved in the Last Year: 0    Homeless in the Last Year: No  Utilities: Not At Risk (05/06/2024)   Epic    Threatened with loss of utilities: No  Health Literacy: Adequate Health Literacy (07/14/2023)   B1300 Health Literacy    Frequency of need for help with medical instructions: Never    Family History  Problem Relation Age of Onset   Hyperlipidemia Mother    Diabetes Father    Diabetes Sister    Cancer Neg Hx    Heart disease Neg Hx    Breast cancer Neg Hx     Past Surgical History:  Procedure Laterality Date   CESAREAN SECTION  2000, 2002     ROS: Review of Systems Negative except as stated above  PHYSICAL EXAM: BP 118/77 (BP Location: Left Arm, Patient Position: Sitting, Cuff Size: Normal)   Pulse 86   Temp 97.9 F (36.6 C) (Oral)   Ht 5' 1 (1.549 m)   Wt 161 lb (73 kg)   SpO2 99%   BMI 30.42 kg/m   Physical Exam  General appearance - alert, well appearing, middle age Hispanic female and in no distress Mental status - normal mood, behavior, speech, dress, motor activity, and thought processes Chest - clear to auscultation, no wheezes, rales or rhonchi, symmetric air entry Heart - normal rate, regular rhythm, normal S1, S2, no murmurs, rubs, clicks or gallops Extremities - peripheral pulses normal, no pedal edema, no clubbing or cyanosis Skin - mouth: fine papular rash LT corner the mouth. LT knee: no edema. Slight point tenderness medial aspect. Good ROM     Latest Ref Rng & Units 06/22/2024    4:37 PM 01/12/2024    2:23 PM 12/10/2021     1:30 PM  CMP  Glucose 70 - 99 mg/dL 885  883  92   BUN 6 - 24 mg/dL 18  8  11    Creatinine 0.57 - 1.00 mg/dL 9.16  9.27  9.46   Sodium 134 - 144 mmol/L 138  138  139   Potassium 3.5 - 5.2 mmol/L 4.0  4.2  4.5   Chloride 96 - 106 mmol/L 101  102  105   CO2 20 - 29 mmol/L 22  19    Calcium 8.7 -  10.2 mg/dL 9.6  9.3  9.3   Total Protein 6.0 - 8.5 g/dL 7.9  7.5  8.1   Total Bilirubin 0.0 - 1.2 mg/dL 0.2  0.2  0.3   Alkaline Phos 41 - 116 IU/L 63  77  71   AST 0 - 40 IU/L 19  25  24    ALT 0 - 32 IU/L 18  35     Lipid Panel     Component Value Date/Time   CHOL 210 (H) 01/12/2024 1423   TRIG 513 (H) 01/12/2024 1423   HDL 26 (L) 01/12/2024 1423   CHOLHDL 8.1 (H) 01/12/2024 1423   LDLCALC 98 01/12/2024 1423    CBC    Component Value Date/Time   WBC 11.3 (H) 06/22/2024 1637   WBC 9.6 06/08/2020 1646   RBC 3.88 06/22/2024 1637   RBC 3.86 (L) 06/08/2020 1646   HGB 12.7 06/22/2024 1637   HCT 38.1 06/22/2024 1637   PLT 415 06/22/2024 1637   MCV 98 (H) 06/22/2024 1637   MCH 32.7 06/22/2024 1637   MCH 26.2 06/08/2020 1646   MCHC 33.3 06/22/2024 1637   MCHC 29.6 (L) 06/08/2020 1646   RDW 13.5 06/22/2024 1637   LYMPHSABS 2.9 06/22/2024 1637   MONOABS 0.6 06/08/2020 1646   EOSABS 0.1 06/22/2024 1637   BASOSABS 0.0 06/22/2024 1637    ASSESSMENT AND PLAN: 1. Facial dermatitis (Primary) Rash with itching and pain around the left side of the mouth for three weeks. Possible allergic reaction, allergen unknown. - Recommended over-the-counter hydrocortisone cream 1% daily for one week.  2. Acute pain of left knee Medial left knee pain for two weeks, exacerbated by bending or standing straight. Possible inflammation. - Prescribed Voltaren  gel two to three times daily as needed. - diclofenac  Sodium (VOLTAREN ) 1 % GEL; Apply 2 grams topically 4 (four) times daily.  Dispense: 100 g; Refill: 1  Patient was given the opportunity to ask questions.  Patient verbalized understanding of the  plan and was able to repeat key elements of the plan.   This documentation was completed using Paediatric nurse.  Any transcriptional errors are unintentional.  No orders of the defined types were placed in this encounter.    Requested Prescriptions   Signed Prescriptions Disp Refills   diclofenac  Sodium (VOLTAREN ) 1 % GEL 100 g 1    Sig: Apply 2 grams topically 4 (four) times daily.    Return in about 6 months (around 03/06/2025).  Barnie Louder, MD, FACP     [1]  Current Outpatient Medications on File Prior to Visit  Medication Sig Dispense Refill   ferrous sulfate  325 (65 FE) MG tablet Take 1 tablet (325 mg total) by mouth daily with breakfast. 120 tablet 0   metFORMIN  (GLUCOPHAGE ) 500 MG tablet Take 1 tablet (500 mg total) by mouth daily with breakfast. 90 tablet 1   omeprazole  (PRILOSEC) 20 MG capsule Take 1 capsule (20 mg total) by mouth daily. 30 capsule 3   Current Facility-Administered Medications on File Prior to Visit  Medication Dose Route Frequency Provider Last Rate Last Admin   triamcinolone  acetonide (KENALOG ) 10 MG/ML injection 10 mg  10 mg Intra-articular Once       [2] No Known Allergies  "

## 2024-09-24 ENCOUNTER — Encounter: Payer: Self-pay | Admitting: Nurse Practitioner

## 2024-10-22 ENCOUNTER — Ambulatory Visit: Admitting: Nurse Practitioner

## 2025-03-14 ENCOUNTER — Ambulatory Visit: Payer: Self-pay | Admitting: Internal Medicine
